# Patient Record
Sex: Female | Born: 1965 | Race: White | Hispanic: No | Marital: Married | Smoking: Never smoker
Health system: Southern US, Community
[De-identification: ages and names within clinical notes are randomized; demographics above are authoritative.]

## PROBLEM LIST (undated history)

## (undated) DIAGNOSIS — E288 Other ovarian dysfunction: Secondary | ICD-10-CM

## (undated) DIAGNOSIS — IMO0002 Reserved for concepts with insufficient information to code with codable children: Secondary | ICD-10-CM

## (undated) DIAGNOSIS — E039 Hypothyroidism, unspecified: Secondary | ICD-10-CM

## (undated) DIAGNOSIS — T8859XA Other complications of anesthesia, initial encounter: Secondary | ICD-10-CM

## (undated) DIAGNOSIS — M7710 Lateral epicondylitis, unspecified elbow: Secondary | ICD-10-CM

## (undated) DIAGNOSIS — C801 Malignant (primary) neoplasm, unspecified: Secondary | ICD-10-CM

## (undated) DIAGNOSIS — T4145XA Adverse effect of unspecified anesthetic, initial encounter: Secondary | ICD-10-CM

## (undated) DIAGNOSIS — Z800 Family history of malignant neoplasm of digestive organs: Secondary | ICD-10-CM

## (undated) DIAGNOSIS — Z801 Family history of malignant neoplasm of trachea, bronchus and lung: Secondary | ICD-10-CM

## (undated) DIAGNOSIS — R7989 Other specified abnormal findings of blood chemistry: Secondary | ICD-10-CM

## (undated) DIAGNOSIS — Z808 Family history of malignant neoplasm of other organs or systems: Secondary | ICD-10-CM

## (undated) DIAGNOSIS — K219 Gastro-esophageal reflux disease without esophagitis: Secondary | ICD-10-CM

## (undated) DIAGNOSIS — M1990 Unspecified osteoarthritis, unspecified site: Secondary | ICD-10-CM

## (undated) DIAGNOSIS — H9319 Tinnitus, unspecified ear: Secondary | ICD-10-CM

## (undated) DIAGNOSIS — D219 Benign neoplasm of connective and other soft tissue, unspecified: Secondary | ICD-10-CM

## (undated) DIAGNOSIS — Z923 Personal history of irradiation: Secondary | ICD-10-CM

## (undated) DIAGNOSIS — Z8049 Family history of malignant neoplasm of other genital organs: Secondary | ICD-10-CM

## (undated) HISTORY — PX: CATARACT EXTRACTION, BILATERAL: SHX1313

## (undated) HISTORY — DX: Family history of malignant neoplasm of trachea, bronchus and lung: Z80.1

## (undated) HISTORY — PX: ADENOIDECTOMY: SUR15

## (undated) HISTORY — DX: Family history of malignant neoplasm of digestive organs: Z80.0

## (undated) HISTORY — DX: Malignant (primary) neoplasm, unspecified: C80.1

## (undated) HISTORY — PX: TUBAL LIGATION: SHX77

## (undated) HISTORY — DX: Benign neoplasm of connective and other soft tissue, unspecified: D21.9

## (undated) HISTORY — PX: LASER ABLATION OF CONDYLOMAS: SHX1948

## (undated) HISTORY — DX: Other ovarian dysfunction: E28.8

## (undated) HISTORY — PX: WISDOM TOOTH EXTRACTION: SHX21

## (undated) HISTORY — DX: Lateral epicondylitis, unspecified elbow: M77.10

## (undated) HISTORY — DX: Family history of malignant neoplasm of other organs or systems: Z80.8

## (undated) HISTORY — DX: Family history of malignant neoplasm of other genital organs: Z80.49

## (undated) HISTORY — DX: Reserved for concepts with insufficient information to code with codable children: IMO0002

## (undated) HISTORY — PX: BREAST EXCISIONAL BIOPSY: SUR124

---
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
## 1985-07-29 HISTORY — PX: BREAST BIOPSY: SHX20

## 1989-11-26 HISTORY — PX: CRYOTHERAPY: SHX1416

## 1998-06-08 ENCOUNTER — Other Ambulatory Visit: Admission: RE | Admit: 1998-06-08 | Discharge: 1998-06-08 | Payer: Self-pay | Admitting: *Deleted

## 1999-02-09 ENCOUNTER — Other Ambulatory Visit: Admission: RE | Admit: 1999-02-09 | Discharge: 1999-02-09 | Payer: Self-pay | Admitting: Obstetrics and Gynecology

## 1999-09-14 ENCOUNTER — Inpatient Hospital Stay (HOSPITAL_COMMUNITY): Admission: AD | Admit: 1999-09-14 | Discharge: 1999-09-17 | Payer: Self-pay | Admitting: Obstetrics and Gynecology

## 1999-09-18 ENCOUNTER — Encounter: Admission: RE | Admit: 1999-09-18 | Discharge: 1999-10-15 | Payer: Self-pay | Admitting: Obstetrics and Gynecology

## 1999-10-17 ENCOUNTER — Other Ambulatory Visit: Admission: RE | Admit: 1999-10-17 | Discharge: 1999-10-17 | Payer: Self-pay | Admitting: Obstetrics and Gynecology

## 2000-11-20 ENCOUNTER — Other Ambulatory Visit: Admission: RE | Admit: 2000-11-20 | Discharge: 2000-11-20 | Payer: Self-pay | Admitting: Obstetrics and Gynecology

## 2001-11-20 ENCOUNTER — Other Ambulatory Visit: Admission: RE | Admit: 2001-11-20 | Discharge: 2001-11-20 | Payer: Self-pay | Admitting: Obstetrics and Gynecology

## 2002-04-13 ENCOUNTER — Ambulatory Visit (HOSPITAL_COMMUNITY): Admission: RE | Admit: 2002-04-13 | Discharge: 2002-04-13 | Payer: Self-pay | Admitting: Gastroenterology

## 2002-04-13 ENCOUNTER — Encounter (INDEPENDENT_AMBULATORY_CARE_PROVIDER_SITE_OTHER): Payer: Self-pay | Admitting: Specialist

## 2002-12-16 ENCOUNTER — Other Ambulatory Visit: Admission: RE | Admit: 2002-12-16 | Discharge: 2002-12-16 | Payer: Self-pay | Admitting: Obstetrics and Gynecology

## 2003-02-11 ENCOUNTER — Ambulatory Visit (HOSPITAL_COMMUNITY): Admission: RE | Admit: 2003-02-11 | Discharge: 2003-02-11 | Payer: Self-pay | Admitting: Obstetrics and Gynecology

## 2003-03-30 ENCOUNTER — Encounter: Payer: Self-pay | Admitting: Internal Medicine

## 2003-03-30 ENCOUNTER — Encounter: Admission: RE | Admit: 2003-03-30 | Discharge: 2003-03-30 | Payer: Self-pay | Admitting: Internal Medicine

## 2003-04-22 ENCOUNTER — Encounter: Admission: RE | Admit: 2003-04-22 | Discharge: 2003-04-22 | Payer: Self-pay | Admitting: Obstetrics and Gynecology

## 2003-04-22 ENCOUNTER — Encounter: Payer: Self-pay | Admitting: Obstetrics and Gynecology

## 2004-07-11 ENCOUNTER — Other Ambulatory Visit: Admission: RE | Admit: 2004-07-11 | Discharge: 2004-07-11 | Payer: Self-pay | Admitting: Obstetrics and Gynecology

## 2005-01-21 ENCOUNTER — Encounter: Admission: RE | Admit: 2005-01-21 | Discharge: 2005-01-21 | Payer: Self-pay | Admitting: Obstetrics and Gynecology

## 2005-01-30 ENCOUNTER — Encounter: Admission: RE | Admit: 2005-01-30 | Discharge: 2005-01-30 | Payer: Self-pay | Admitting: Obstetrics and Gynecology

## 2005-05-21 ENCOUNTER — Ambulatory Visit: Payer: Self-pay | Admitting: Internal Medicine

## 2005-05-22 ENCOUNTER — Ambulatory Visit: Payer: Self-pay | Admitting: Internal Medicine

## 2005-10-29 ENCOUNTER — Other Ambulatory Visit: Admission: RE | Admit: 2005-10-29 | Discharge: 2005-10-29 | Payer: Self-pay | Admitting: Obstetrics and Gynecology

## 2006-03-06 ENCOUNTER — Encounter: Admission: RE | Admit: 2006-03-06 | Discharge: 2006-03-06 | Payer: Self-pay | Admitting: Obstetrics and Gynecology

## 2006-08-25 ENCOUNTER — Encounter: Admission: RE | Admit: 2006-08-25 | Discharge: 2006-08-25 | Payer: Self-pay | Admitting: Obstetrics & Gynecology

## 2006-12-29 ENCOUNTER — Other Ambulatory Visit: Admission: RE | Admit: 2006-12-29 | Discharge: 2006-12-29 | Payer: Self-pay | Admitting: Obstetrics and Gynecology

## 2008-01-06 ENCOUNTER — Encounter: Admission: RE | Admit: 2008-01-06 | Discharge: 2008-01-06 | Payer: Self-pay | Admitting: Obstetrics and Gynecology

## 2008-04-26 ENCOUNTER — Other Ambulatory Visit: Admission: RE | Admit: 2008-04-26 | Discharge: 2008-04-26 | Payer: Self-pay | Admitting: Obstetrics and Gynecology

## 2008-12-27 DIAGNOSIS — E2839 Other primary ovarian failure: Secondary | ICD-10-CM

## 2008-12-27 HISTORY — DX: Other primary ovarian failure: E28.39

## 2009-02-06 ENCOUNTER — Encounter: Admission: RE | Admit: 2009-02-06 | Discharge: 2009-02-06 | Payer: Self-pay | Admitting: Obstetrics and Gynecology

## 2009-02-09 ENCOUNTER — Encounter: Admission: RE | Admit: 2009-02-09 | Discharge: 2009-02-09 | Payer: Self-pay | Admitting: Obstetrics and Gynecology

## 2009-08-28 ENCOUNTER — Encounter: Admission: RE | Admit: 2009-08-28 | Discharge: 2009-08-28 | Payer: Self-pay | Admitting: Obstetrics and Gynecology

## 2010-04-28 HISTORY — PX: BREAST SURGERY: SHX581

## 2010-05-14 ENCOUNTER — Encounter: Admission: RE | Admit: 2010-05-14 | Discharge: 2010-05-14 | Payer: Self-pay | Admitting: Obstetrics and Gynecology

## 2010-05-22 ENCOUNTER — Encounter: Admission: RE | Admit: 2010-05-22 | Discharge: 2010-05-22 | Payer: Self-pay | Admitting: Obstetrics and Gynecology

## 2010-08-19 ENCOUNTER — Encounter: Payer: Self-pay | Admitting: Obstetrics and Gynecology

## 2010-12-14 NOTE — Op Note (Signed)
[facility]  Patient:    Alexis Young, Alexis Young                    MRN: [id number] Proc. Date: 09/14/99 Adm. Date:  [id number] Attending:  Silverio Lay A                           Operative Report  PREOPERATIVE DIAGNOSIS:       A 40-weeks and 1-day suspected macrosomia and failure                               to descend with failed vacuum attempt.  POSTOPERATIVE DIAGNOSIS:      A 40-weeks and 1-day suspected macrosomia and failure                               to descend with failed vacuum attempt, with confirmed                               macrosomia.  OPERATION:                    Primary low transverse cesarean section.  SURGEON:                      Silverio Lay, M.D.  ASSISTANT:                    Genia Del, M.D.  ANESTHESIA:                   Epidural.  ESTIMATED BLOOD LOSS:         600 cc.  DESCRIPTION OF PROCEDURE:     After being informed of the procedure and the possible complications including bleeding, infection, and trauma to other organs, informed consent was obtained.  The patient was taken to OR #2.  Epidural anesthesia was increased.  The patient was placed in a dorsal decubitus with pelvis tilted to the left.  Prepped and draped in a sterile fashion, and a Foley catheter was already in place.  After assessing adequate level of anesthesia, the skin, subcutaneous tissue, and fat were incised in a Pfannenstiel way.  The fascia was incised in a low transverse fashion and linea alba was dissected, and peritoneum was entered in the midline fascia.  The visceral peritoneum was opened in a low  transverse fashion, and allowing [place] to bluntly dissect the bladder and retract he bladder with bladder retractor.  The uterus was then entered in a low transverse fashion, first with knife and then with scissors.  Amniotic fluid was clear. We assisted the birth of a female infant at 9:53 p.m. in right OP presentation.   Two  nuchal cord loops were reduced.  Mouth and nose were suctioned, and shoulders were delivered.  Cord was clamped with two Kelly clamps ________, and the baby was given to the pediatrician present in the room.  20 cc of blood were drawn from he umbilical vein and the placenta was continuously expelled, complete, and cord has three vessels.  Uterine revision was negative.  The uterus was then closed in two layers, first a running locked suture of 0 Vicryl, and them a Lambert suture of  0 Vicryl covering the first  one.  Hemostasis was completed at the two angles with a figure-of-eight stitch of 0 Vicryl.  Bleeding was found on the bladder flap and was controlled with two figure-of-eight stitch of 3-0 Vicryl.  Both pericolic gutters were cleansed.  Both tubes and ovaries were visualized and normal.  Hemostasis n the uterine incision was reassessed and adequate.  Hemostasis under fascia was completed with cautery, and the fascia was closed with ________ suture of 0 Vicryl meeting midline.  Subcutaneous tissues and fat were irrigated with saline. Hemostasis was obtained with cautery.  The wound was infiltrated with Marcaine .25 10 cc, and the skin was closed with staples.  Estimated blood loss is 600 cc. he baby received an Apgar of 8 at one minute and 9 at five minutes, and weighed  9 pounds and 6 ounces.  Sponge and instrument count is complete x 2, and the procedure was very well tolerated by the patient who was taken to the recovery oom in a well and stable condition. DD:  09/14/99 TD:  09/16/99 Job: [id number]

## 2010-12-14 NOTE — Op Note (Signed)
NAMEASAL, TEAS                       ACCOUNT NO.:  [id number]   MEDICAL RECORD NO.:  [id number]                   PATIENT TYPE:  AMB   LOCATION:  SDC                                  FACILITY:  [facility]   PHYSICIAN:  Dois Davenport A. Rivard, M.D.              DATE OF BIRTH:  1966-06-26   DATE OF PROCEDURE:  02/11/2003  DATE OF DISCHARGE:                                 OPERATIVE REPORT   PREOPERATIVE DIAGNOSIS:  Desire for sterilization.   POSTOPERATIVE DIAGNOSIS:  Desire for sterilization.   ANESTHESIA:  General.   PROCEDURE:  Bilateral tubal sterilization by laparoscopy.   SURGEON:  Crist Fat. Rivard, M.D.   ESTIMATED BLOOD LOSS:  Minimal.   PROCEDURE:  After being informed of the planned procedure with possible  complications including bleeding, infection, injury to bowels or bladder,  need for laparotomy, irreversibility, and failure rate of one in 500,  informed consent was obtained.  The patient is taken to OR #3, given general  anesthesia with endotracheal intubation without any complication.  She was  placed in a lithotomy position, prepped and draped in a sterile fashion.  Her bladder is emptied with an in-and-out catheter.   A speculum is inserted in the vagina.  Anterior lip of the cervix is grasped  with a tenaculum forceps, and an acorn intrauterine manipulator is placed.  We infiltrate the umbilical area with 5 mL of Marcaine 0.25% and perform a  semi-elliptical incision with knife.  Veress needle is then inserted to  create a pneumoperitoneum with CO2 at a maximum pressure of 15 mmHg.  Veress  needle is removed and a 10 mm trocar is inserted with the laparoscope on the  video.   Observation:  Anterior cul-de-sac is normal, posterior cul-de-sac is normal.  Right tube, right ovary normal.  Left tube, left ovary normal.  Uterus is  slightly bulky with no specific fibroid, most likely adenomyosis, and size  would correspond to an eight-week pregnancy.  Appendix is  normal.  Liver  edge is seen and normal.   We proceed with cauterization of both tubes in the isthmic-ampullary area on  a 1.5 cm distance, including mesosalpinx.  The procedure is well-tolerated.  Instrument and sponge counts are complete x2.  Instruments are removed.  Pneumoperitoneum is evacuated.  The fascia of the incision is closed with a  simple suture of 0 Vicryl under direct visualization.  Skin is closed with  two subcuticular suture of 4-0 Vicryl.   Estimated blood loss is minimal.  Instrument and sponge counts are complete  x2.  The procedure is well-tolerated by the patient, who is taken to the  recovery room in a well and stable condition.  Crist Fat Rivard, M.D.   SAR/MEDQ  D:  02/11/2003  T:  02/13/2003  Job:  [id number]

## 2010-12-14 NOTE — Discharge Summary (Signed)
[facility]  Patient:    Alexis Young, BRONER                    MRN: [id number] Adm. Date:  [id number] Disc. Date: [id number] Attending:  Silverio Lay A                           Discharge Summary  REASON FOR ADMISSION:         Suspicion of macrosomia in a term pregnancy with favorable cervix.  HISTORY OF PRESENT ILLNESS:   This is a 45 year old, gravida 1, para 0, abortus 0, patient with an estimated delivery date of September 13, 1999, being admitted at [redacted] weeks gestation after being evaluated in the office with an estimated fetal weight of 4344 grams for 93rd percentile.  The amniotic fluid index at that time was normal.  Cervical examination was 2 cm, 75%, vertex -1 and the patient was offered elective induction of labor for suspected macrosomia.  Labor progressed normally and the patient was complete by 6:40 p.m. that same day with the baby presenting in ROP station, +1 to +2.  After two hours of pushing and no change in station which was +2, the patient was offered a trial of vacuum extraction with knowledge that risk of shoulder dystocia remained.  That attempt was failed and decision was made to proceed with primary low transverse cesarean section for failure to descend.    HOSPITAL COURSE:              The patient underwent that surgery and delivered  female infant at 9:63 p.m. weighing 9 pounds 6 ounces with Apgars of 8 at one minute and 9 at five minutes with two loose nuchal cord loops.  There were no complications during surgery.  Postoperative course was uneventful and the patient was discharged from the hospital on September 17, 1999, in good and stable condition.  She was asked to return to the office in four to six weeks for her postoperative evaluation and she was given a Tylox prescription for pain control. She was also instructed to call if experiencing increased bleeding, increased pain, or fever. DD:  11/05/99 TD:   11/06/99 Job: [id number]

## 2010-12-14 NOTE — Op Note (Signed)
NAMESHANDEE, Alexis Young                       ACCOUNT NO.:  [id number]   MEDICAL RECORD NO.:  [id number]                   PATIENT TYPE:  AMB   LOCATION:  ENDO                                 FACILITY:  [facility]   PHYSICIAN:  Charna Elizabeth, M.D.                   DATE OF BIRTH:  08/24/1965   DATE OF PROCEDURE:  04/13/2002  DATE OF DISCHARGE:                                 OPERATIVE REPORT   PROCEDURE:  Colonoscopy with biopsies.   ENDOSCOPIST:  Charna Elizabeth, M.D.   INSTRUMENT USED:  Olympus video colonoscope (adjustable pediatric scope).   INDICATION FOR PROCEDURE:  History of change in bowel habits with worsening  diarrhea and abdominal pain, with elevated sedimentation rate in a 45-year-  old white female with a family history of colon cancer.  Rule out IBD,  colonic polyps, etc.   PREPROCEDURE PREPARATION:  Informed consent was procured from the patient.  The patient was fasted for eight hours prior to the procedure and prepped  with a bottle of magnesium citrate and a gallon of NuLytely the night prior  to the procedure.   PREPROCEDURE PHYSICAL:  VITAL SIGNS:  The patient had stable vital signs.  NECK:  Supple.  CHEST:  Clear to auscultation.  S1, S2 regular.  ABDOMEN:  Soft with normal bowel sounds.   DESCRIPTION OF PROCEDURE:  The patient was placed in the left lateral  decubitus position and sedated with 60 mg of Demerol and 6 mg of Versed  intravenously.  Once the patient was adequately sedate and maintained on low-  flow oxygen and continuous cardiac monitoring, the Olympus video colonoscope  was advanced from the rectum to the cecum and terminal ileum without  difficulty.  The entire colonic mucosa appeared healthy with no evidence of  colitis, masses, polyps, erosions, ulcerations, or diverticulosis.  The  scope was advanced into the terminal ileum.  There was evidence of  inflammatory change in the terminal ileum, which was biopsied for pathology.  No frank erosions  or masses were seen.  The patient tolerated the procedure  well without complications.   IMPRESSION:  1. Normal-appearing colonic mucosa.  2. Inflammatory change seen in the terminal ileum, biopsied to rule out     inflammatory bowel disease.    RECOMMENDATIONS:  1. Await pathology results.  2. Avoid all nonsteroidals, including aspirin.  3. Outpatient follow-up in the next two weeks for further recommendations.                                               Charna Elizabeth, M.D.    JM/MEDQ  D:  04/13/2002  T:  04/14/2002  Job:  [id number]   cc:   Dois Davenport A. Rivard, M.D.  [address]  [place]  [place]  [address]  Fax: [phone]

## 2011-04-23 ENCOUNTER — Other Ambulatory Visit: Payer: Self-pay | Admitting: Obstetrics and Gynecology

## 2011-04-23 DIAGNOSIS — Z1231 Encounter for screening mammogram for malignant neoplasm of breast: Secondary | ICD-10-CM

## 2011-06-03 ENCOUNTER — Ambulatory Visit
Admission: RE | Admit: 2011-06-03 | Discharge: 2011-06-03 | Disposition: A | Discharge status: Home / Self Care | Payer: BC Managed Care – PPO | Source: Ambulatory Visit | Attending: Obstetrics and Gynecology | Admitting: Obstetrics and Gynecology

## 2011-06-03 DIAGNOSIS — Z1231 Encounter for screening mammogram for malignant neoplasm of breast: Secondary | ICD-10-CM

## 2011-08-29 LAB — HM PAP SMEAR: HM Pap smear: NEGATIVE

## 2012-05-06 ENCOUNTER — Other Ambulatory Visit: Payer: Self-pay | Admitting: Obstetrics and Gynecology

## 2012-05-06 DIAGNOSIS — Z1231 Encounter for screening mammogram for malignant neoplasm of breast: Secondary | ICD-10-CM

## 2012-06-05 ENCOUNTER — Ambulatory Visit
Admission: RE | Admit: 2012-06-05 | Discharge: 2012-06-05 | Disposition: A | Discharge status: Home / Self Care | Payer: BC Managed Care – PPO | Source: Ambulatory Visit | Attending: Obstetrics and Gynecology | Admitting: Obstetrics and Gynecology

## 2012-06-05 DIAGNOSIS — Z1231 Encounter for screening mammogram for malignant neoplasm of breast: Secondary | ICD-10-CM

## 2012-06-10 ENCOUNTER — Other Ambulatory Visit: Payer: Self-pay | Admitting: Obstetrics and Gynecology

## 2012-06-10 DIAGNOSIS — R928 Other abnormal and inconclusive findings on diagnostic imaging of breast: Secondary | ICD-10-CM

## 2012-06-12 ENCOUNTER — Other Ambulatory Visit: Payer: Self-pay | Admitting: Obstetrics and Gynecology

## 2012-06-12 ENCOUNTER — Ambulatory Visit
Admission: RE | Admit: 2012-06-12 | Discharge: 2012-06-12 | Disposition: A | Discharge status: Home / Self Care | Payer: BC Managed Care – PPO | Source: Ambulatory Visit | Attending: Obstetrics and Gynecology | Admitting: Obstetrics and Gynecology

## 2012-06-12 DIAGNOSIS — R928 Other abnormal and inconclusive findings on diagnostic imaging of breast: Secondary | ICD-10-CM

## 2012-06-23 ENCOUNTER — Ambulatory Visit
Admission: RE | Admit: 2012-06-23 | Discharge: 2012-06-23 | Disposition: A | Discharge status: Home / Self Care | Payer: BC Managed Care – PPO | Source: Ambulatory Visit | Attending: Obstetrics and Gynecology | Admitting: Obstetrics and Gynecology

## 2012-06-23 ENCOUNTER — Other Ambulatory Visit: Payer: Self-pay | Admitting: Obstetrics and Gynecology

## 2012-06-23 DIAGNOSIS — R928 Other abnormal and inconclusive findings on diagnostic imaging of breast: Secondary | ICD-10-CM

## 2012-06-26 ENCOUNTER — Other Ambulatory Visit: Payer: BC Managed Care – PPO

## 2012-06-29 ENCOUNTER — Other Ambulatory Visit: Payer: BC Managed Care – PPO

## 2013-01-22 ENCOUNTER — Ambulatory Visit (INDEPENDENT_AMBULATORY_CARE_PROVIDER_SITE_OTHER): Payer: BC Managed Care – PPO | Admitting: Obstetrics & Gynecology

## 2013-01-22 ENCOUNTER — Encounter: Payer: Self-pay | Admitting: Obstetrics & Gynecology

## 2013-01-22 ENCOUNTER — Encounter: Payer: Self-pay | Admitting: *Deleted

## 2013-01-22 ENCOUNTER — Telehealth: Payer: Self-pay | Admitting: Nurse Practitioner

## 2013-01-22 ENCOUNTER — Other Ambulatory Visit: Payer: Self-pay | Admitting: Obstetrics & Gynecology

## 2013-01-22 VITALS — BP 116/70 | HR 64 | Resp 12 | Ht 65.0 in | Wt 193.4 lb

## 2013-01-22 DIAGNOSIS — J069 Acute upper respiratory infection, unspecified: Secondary | ICD-10-CM

## 2013-01-22 DIAGNOSIS — N6019 Diffuse cystic mastopathy of unspecified breast: Secondary | ICD-10-CM

## 2013-01-22 DIAGNOSIS — N63 Unspecified lump in unspecified breast: Secondary | ICD-10-CM

## 2013-01-22 MED ORDER — AZITHROMYCIN 250 MG PO TABS: 250.0000 mg | ORAL_TABLET | Freq: Every day | ORAL | Status: DC

## 2013-01-22 NOTE — Telephone Encounter (Signed)
Patietn found breast lump last night. OV today at 145.

## 2013-01-22 NOTE — Telephone Encounter (Signed)
Left breast lump found last night. [facility] asked her to call us.

## 2013-01-22 NOTE — Progress Notes (Signed)
Subjective:     Patient ID: Alexis Young, female   DOB: 1966/06/15, 47 y.o.   MRN: [id number]  HPI 47 year old G1P1 MWF here for left breast lump she noted last night and the shower.  She has a history of fibrocystic breast changes.  10/11--right bx showign fibroadenoma.  11/13--fibrocystic changes.  All done after having 3D MMG for screening.  No trauma.  No nipple discharge.  No family hx of breast cancer.    Back from [place].  Has had cough and sneezing for last two weeks.  Been on mucinex and robitussin and tylenol cold/sinus.  Taking Nyquil at night.      Review of Systems  All other systems reviewed and are negative.      Objective:   Physical Exam  Constitutional: She appears well-developed and well-nourished.  Neck: Normal range of motion. Neck supple. No thyromegaly present.  Cardiovascular: Normal rate and regular rhythm.   Pulmonary/Chest: Effort normal and breath sounds normal. Right breast exhibits mass. Right breast exhibits no inverted nipple, no nipple discharge, no skin change and no tenderness. Left breast exhibits no inverted nipple, no mass, no nipple discharge, no skin change and no tenderness.    Lymphadenopathy:    She has no cervical adenopathy.       Assessment:     Left breast mass/cyst Fibrocystic breast changes URI     Plan:     Diagnostic left MMG/Ultrasound Pt will use Zyrtec or Claritin and then Zpak if no improvement

## 2013-01-22 NOTE — Patient Instructions (Signed)
We will call you after the results are back to discuss if there is anything else I want done.

## 2013-01-25 ENCOUNTER — Ambulatory Visit: Payer: Self-pay | Admitting: Obstetrics & Gynecology

## 2013-02-04 ENCOUNTER — Ambulatory Visit
Admission: RE | Admit: 2013-02-04 | Discharge: 2013-02-04 | Disposition: A | Discharge status: Home / Self Care | Payer: BC Managed Care – PPO | Source: Ambulatory Visit | Attending: Obstetrics & Gynecology | Admitting: Obstetrics & Gynecology

## 2013-02-04 DIAGNOSIS — N6019 Diffuse cystic mastopathy of unspecified breast: Secondary | ICD-10-CM

## 2013-02-04 DIAGNOSIS — N63 Unspecified lump in unspecified breast: Secondary | ICD-10-CM

## 2013-02-11 ENCOUNTER — Telehealth: Payer: Self-pay | Admitting: Orthopedic Surgery

## 2013-02-11 NOTE — Telephone Encounter (Signed)
Message copied by Lorenz Coaster P on Thu Feb 11, 2013  1:30 PM ------      Message from: Jerene Bears      Created: Thu Feb 04, 2013  1:11 PM       Please call pt.  MMG and ultrasound showed breast cyst.  She should have a routine MMG in December.  Can come out of current hold.  No recall needed.  Please have her call or return if she has any worsening symptoms. ------

## 2013-02-11 NOTE — Telephone Encounter (Signed)
Message copied by Alfredo Batty on Thu Feb 11, 2013  3:30 PM ------      Message from: Jerene Bears      Created: Thu Feb 04, 2013  1:11 PM       Please call pt.  MMG and ultrasound showed breast cyst.  She should have a routine MMG in December.  Can come out of current hold.  No recall needed.  Please have her call or return if she has any worsening symptoms. ------

## 2013-02-11 NOTE — Telephone Encounter (Signed)
LMTCB re: breast US results.  aa

## 2013-02-16 ENCOUNTER — Telehealth: Payer: Self-pay | Admitting: Orthopedic Surgery

## 2013-02-16 NOTE — Telephone Encounter (Signed)
Message copied by Alfredo Batty on Tue Feb 16, 2013  9:46 AM ------      Message from: Jerene Bears      Created: Thu Feb 04, 2013  1:11 PM       Please call pt.  MMG and ultrasound showed breast cyst.  She should have a routine MMG in December.  Can come out of current hold.  No recall needed.  Please have her call or return if she has any worsening symptoms. ------

## 2013-02-16 NOTE — Telephone Encounter (Signed)
Spoke with pt about results of breast US. Pt instructed to have routine MMG in December. Pt agreeable.

## 2013-03-29 HISTORY — PX: MELANOMA EXCISION: SHX5266

## 2013-03-31 ENCOUNTER — Other Ambulatory Visit: Payer: Self-pay | Admitting: Dermatology

## 2013-05-04 ENCOUNTER — Other Ambulatory Visit: Payer: Self-pay

## 2013-05-04 DIAGNOSIS — Z1231 Encounter for screening mammogram for malignant neoplasm of breast: Secondary | ICD-10-CM

## 2013-06-28 ENCOUNTER — Ambulatory Visit
Admission: RE | Admit: 2013-06-28 | Discharge: 2013-06-28 | Disposition: A | Discharge status: Home / Self Care | Payer: BC Managed Care – PPO | Source: Ambulatory Visit

## 2013-06-28 DIAGNOSIS — Z1231 Encounter for screening mammogram for malignant neoplasm of breast: Secondary | ICD-10-CM

## 2013-06-28 HISTORY — PX: SKIN CANCER EXCISION: SHX779

## 2013-07-26 ENCOUNTER — Other Ambulatory Visit: Payer: Self-pay | Admitting: Dermatology

## 2013-09-17 ENCOUNTER — Ambulatory Visit: Payer: Self-pay | Admitting: Nurse Practitioner

## 2013-09-21 ENCOUNTER — Encounter: Payer: Self-pay | Admitting: Nurse Practitioner

## 2013-09-21 ENCOUNTER — Ambulatory Visit (INDEPENDENT_AMBULATORY_CARE_PROVIDER_SITE_OTHER): Payer: BC Managed Care – PPO | Admitting: Nurse Practitioner

## 2013-09-21 VITALS — BP 122/80 | HR 68 | Ht 65.0 in | Wt 194.0 lb

## 2013-09-21 DIAGNOSIS — E288 Other ovarian dysfunction: Secondary | ICD-10-CM

## 2013-09-21 DIAGNOSIS — Z01419 Encounter for gynecological examination (general) (routine) without abnormal findings: Secondary | ICD-10-CM

## 2013-09-21 DIAGNOSIS — Z0000 Encounter for general adult medical examination without abnormal findings: Secondary | ICD-10-CM

## 2013-09-21 DIAGNOSIS — L659 Nonscarring hair loss, unspecified: Secondary | ICD-10-CM | POA: Insufficient documentation

## 2013-09-21 DIAGNOSIS — N926 Irregular menstruation, unspecified: Secondary | ICD-10-CM

## 2013-09-21 DIAGNOSIS — N939 Abnormal uterine and vaginal bleeding, unspecified: Secondary | ICD-10-CM

## 2013-09-21 DIAGNOSIS — E2839 Other primary ovarian failure: Secondary | ICD-10-CM | POA: Insufficient documentation

## 2013-09-21 DIAGNOSIS — R232 Flushing: Secondary | ICD-10-CM | POA: Insufficient documentation

## 2013-09-21 LAB — POCT URINALYSIS DIPSTICK
Bilirubin, UA: NEGATIVE
Glucose, UA: NEGATIVE
Ketones, UA: NEGATIVE
Nitrite, UA: NEGATIVE
Urobilinogen, UA: NEGATIVE
pH, UA: 6

## 2013-09-21 LAB — HEMOGLOBIN, FINGERSTICK: Hemoglobin, fingerstick: 13 g/dL (ref 12.0–16.0)

## 2013-09-21 MED ORDER — MEDROXYPROGESTERONE ACETATE 10 MG PO TABS: 10.0000 mg | ORAL_TABLET | ORAL | Status: DC

## 2013-09-21 MED ORDER — VENLAFAXINE HCL ER 75 MG PO CP24: 75.0000 mg | ORAL_CAPSULE | Freq: Every day | ORAL | Status: DC

## 2013-09-21 MED ORDER — ESTRADIOL 1 MG PO TABS: 1.0000 mg | ORAL_TABLET | Freq: Every day | ORAL | Status: DC

## 2013-09-21 NOTE — Patient Instructions (Signed)

## 2013-09-21 NOTE — Progress Notes (Signed)
Patient ID: Alexis Young, female   DOB: Oct 08, 1965, 48 y.o.   MRN: [id number] 48 y.o. G1P1001. Married Caucasian Fe here for annual exam.  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  Past 2 cycles menses is "old brown looking" X 5 days.  Then past 2 days again brown vaginal spotting. No cramps or other PMS symptoms.  No urinary symptoms. She has been sick with bronchitis and took courses of antibiotics and steroids X 3 since end of October.  In fact she is still having tinnitus and is to see ENT tomorrow. Now diagnosed with pleurisy and now on Naprosyn.  She wants to restart Effexor for the vaso symptoms.  They are pretty regular and sometimes worse at night.  She took Effexor XR after the birth of her last child and did well.  Also having increase in hair loss.  Wants to check some labs.  Patient's last menstrual period was 09/07/2013.          Sexually active: yes  The current method of family planning is none.    Exercising: yes  walking on treadmill Smoker:  no  Health Maintenance: Pap:  08/29/11, WNL, neg HR HPV MMG:  06/28/13, Bi-Rads 1: negative Colonoscopy: 03/2002 normal TDaP:  6/08 Labs:  HB:  13.0 Urine:  Large RBC, 1+ leuk's, trace protein,    reports that she has never smoked. She has never used smokeless tobacco. She reports that she does not drink alcohol or use illicit drugs.  Past Medical History  Diagnosis Date  . Fibroid   . Dyspareunia   . Cancer 03/2013, 06/2013    malignant melanoma on right arm and back  . 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 Tubal ligation    . Melanoma excision Right 03/2013    melanoma right arm  . Skin cancer excision  06/2013    midddle of back with path report of atypia  . Cryotherapy  5/91    for persistent condyloma atypia  . Laser ablation of condylomas  03/05/90 Dr. Warnell Forester    perineal area- also had Bowens disease  . Breast surgery Right 04/2010   breast biopsy for multiple fibroadenoma - no atypia  . Breast biopsy  1987    Dr. Rosana Hoes    Current Outpatient Prescriptions  Medication Sig Dispense Refill  . estradiol (ESTRACE) 1 MG tablet Take 1 tablet (1 mg total) by mouth daily.  90 tablet  3  . medroxyPROGESTERone (PROVERA) 10 MG tablet Take 1 tablet (10 mg total) by mouth as directed. Take day 1-7  30 tablet  3  . venlafaxine XR (EFFEXOR XR) 75 MG 24 hr capsule Take 1 capsule (75 mg total) by mouth daily with breakfast.  90 capsule  0   No current facility-administered medications for this visit.    Family History  Problem Relation Age of Onset  . Cancer Mother     lung  . Alcohol abuse Father     Cirrhosis of liver  . Cancer Maternal Aunt     stomach cancer  . Cancer Maternal Grandmother     uterine cancer    ROS:  Pertinent items are noted in HPI.  Otherwise, a comprehensive ROS was negative.  Exam:   BP 122/80  Pulse 68  Ht 5\' 5"  (1.651 m)  Wt 194 lb (87.998 kg)  BMI 32.28 kg/m2  LMP 09/07/2013  Height: 5\' 5"  (165.1 cm)  Ht Readings from Last 3 Encounters:  09/21/13 5\' 5"  (1.651 m)  01/22/13 5\' 5"  (1.651 m)    General appearance: alert, cooperative and appears stated age Head: Normocephalic, without obvious abnormality, atraumatic Neck: no adenopathy, supple, symmetrical, trachea midline and thyroid normal to inspection and palpation Lungs: clear to auscultation bilaterally Breasts: normal appearance, no masses or tenderness Heart: regular rate and rhythm Abdomen: soft, non-tender; no masses,  no organomegaly Extremities: extremities normal, atraumatic, no cyanosis or edema Skin: Skin color, texture, turgor normal. No rashes or lesions Lymph nodes: Cervical, supraclavicular, and axillary nodes normal. No abnormal inguinal nodes palpated Neurologic: Grossly normal   Pelvic: External genitalia:  no lesions              Urethra:  normal appearing urethra with no masses, tenderness or lesions               Bartholin's and Skene's: normal                 Vagina: normal appearing vagina with normal color and discharge, no lesions              Cervix: anteverted              Pap taken: no Bimanual Exam:  Uterus:  normal size, contour, position, consistency, mobility, non-tender              Adnexa: no mass, fullness, tenderness               Rectovaginal: Confirms               Anus:  normal sphincter tone, no lesions  A:  Well Woman with normal exam  History of POF and on HRT since 2010  AUB this month - maybe secondary to antibiotics and steroids  Increase in Vaso symptoms  Hair loss  P:   Pap smear as per guidelines   Mammogram due 12/15  Refill on Estrace 1 mg daily for a year and Provera 10 day 1-7 monthly for a year  Counseling about HRT with potential SE, risk of DVT, CVA, cancer, etc  She will call back if AUB for next month and will most likely do PUS/ SHGM  Will plan to recheck her in 3 months with progress on Effexor XR, will call earlier if any symptoms worsens  Counseled on breast self exam, mammography screening, use and side effects of HRT, adequate intake of calcium and vitamin D, diet and exercise return annually or prn  An After Visit Summary was printed and given to the patient.

## 2013-09-22 LAB — CBC WITH DIFFERENTIAL/PLATELET
Basophils Absolute: 0.1 10*3/uL (ref 0.0–0.1)
Basophils Relative: 1 % (ref 0–1)
Eosinophils Absolute: 0.1 10*3/uL (ref 0.0–0.7)
Eosinophils Relative: 2 % (ref 0–5)
HCT: 39 % (ref 36.0–46.0)
Hemoglobin: 13.3 g/dL (ref 12.0–15.0)
Lymphocytes Relative: 34 % (ref 12–46)
Lymphs Abs: 1.8 10*3/uL (ref 0.7–4.0)
MCH: 28.9 pg (ref 26.0–34.0)
MCHC: 34.1 g/dL (ref 30.0–36.0)
MCV: 84.6 fL (ref 78.0–100.0)
Monocytes Absolute: 0.4 10*3/uL (ref 0.1–1.0)
Monocytes Relative: 8 % (ref 3–12)
Neutro Abs: 2.9 10*3/uL (ref 1.7–7.7)
Neutrophils Relative %: 55 % (ref 43–77)
Platelets: 283 10*3/uL (ref 150–400)
RBC: 4.61 MIL/uL (ref 3.87–5.11)
RDW: 13.8 % (ref 11.5–15.5)
WBC: 5.3 10*3/uL (ref 4.0–10.5)

## 2013-09-22 LAB — HEMOGLOBIN A1C
Hgb A1c MFr Bld: 5.6 % (ref <5.7)
Mean Plasma Glucose: 114 mg/dL (ref <117)

## 2013-09-22 LAB — TSH: TSH: 3.857 u[IU]/mL (ref 0.350–4.500)

## 2013-09-23 NOTE — Progress Notes (Signed)
Encounter reviewed by Dr. Josefa Half. I recommend proceeding with a pelvic ultrasound, sonohysterogram, and endometrial biopsy.  May need to change progesterone regimen of HRT to 14 days per month or daily, depending on the dosage.

## 2013-09-29 ENCOUNTER — Telehealth: Payer: Self-pay | Admitting: *Deleted

## 2013-09-29 NOTE — Telephone Encounter (Signed)
I have attempted to contact this patient by phone with the following results: left message to return my call on answering machine (mobile).  Pt has lab result note and also needs to proceed with pelvic ultrasound and SHGM per PGrubb.

## 2013-09-29 NOTE — Telephone Encounter (Signed)
Message copied by Graylon Good on Wed Sep 29, 2013 10:22 AM ------      Message from: GRUBB, PATRICIA R      Created: Thu Sep 23, 2013  9:38 AM       Let patient know that labs are normal ------

## 2013-09-30 ENCOUNTER — Telehealth: Payer: Self-pay | Admitting: Emergency Medicine

## 2013-09-30 DIAGNOSIS — N939 Abnormal uterine and vaginal bleeding, unspecified: Secondary | ICD-10-CM

## 2013-09-30 NOTE — Telephone Encounter (Signed)
Patient is calling Stephanie back

## 2013-09-30 NOTE — Telephone Encounter (Signed)
I have attempted to contact this patient by phone with the following results: left message to return my call on answering machine (mobile).  Advised I will be in the office all day today and if I am unavailable to ask for [facility].

## 2013-09-30 NOTE — Telephone Encounter (Signed)
Pt notified in result note.  Pt notified of need for ultrasound and SHGM and is agreeable.

## 2013-09-30 NOTE — Telephone Encounter (Signed)
Message left to return call to [place] at [phone].   To schedule SHGM and endo biopsy.

## 2013-10-01 NOTE — Telephone Encounter (Signed)
SHGM/Endo bx  scheduled and patient aware/agreeable to time. Advised appointment will be with Dr. Quincy Simmonds.  Pre-procedure instructions given. Motrin instructions given. Motrin=Advil=Ibuprofen Can take 800 mg (Can purchase over the counter, you will need four 200 mg pills). Take with food. Make sure to eat a meal and drink fluids prior to appointment.   Patient verbalized understanding of the [facility] appointment cancellation policy. Advised will need to cancel within 72 business hours (3 business days) or will have no show fee placed to account. $150.00 for [facility].   Patient verbalized understanding of all instructions and plans.  Routing to provider for final review. Patient agreeable to disposition. Will close encounter

## 2013-10-07 ENCOUNTER — Other Ambulatory Visit: Payer: Self-pay | Admitting: Obstetrics and Gynecology

## 2013-10-07 ENCOUNTER — Ambulatory Visit (INDEPENDENT_AMBULATORY_CARE_PROVIDER_SITE_OTHER): Payer: BC Managed Care – PPO | Admitting: Obstetrics and Gynecology

## 2013-10-07 ENCOUNTER — Encounter: Payer: Self-pay | Admitting: Obstetrics and Gynecology

## 2013-10-07 ENCOUNTER — Ambulatory Visit (INDEPENDENT_AMBULATORY_CARE_PROVIDER_SITE_OTHER): Payer: BC Managed Care – PPO

## 2013-10-07 VITALS — BP 118/76 | HR 64 | Ht 65.0 in | Wt 196.0 lb

## 2013-10-07 DIAGNOSIS — N939 Abnormal uterine and vaginal bleeding, unspecified: Secondary | ICD-10-CM

## 2013-10-07 DIAGNOSIS — N926 Irregular menstruation, unspecified: Secondary | ICD-10-CM

## 2013-10-07 DIAGNOSIS — D259 Leiomyoma of uterus, unspecified: Secondary | ICD-10-CM

## 2013-10-07 NOTE — Progress Notes (Signed)
Patient ID: Alexis Young, female   DOB: [birth year], 48 y.o.   MRN: [id number]   See separate note above for ultrasound/sonohysterogram/endometrial biopsy.

## 2013-10-07 NOTE — Progress Notes (Signed)
Subjective   48 y.o.   Married  Caucasian  female   G1P1001 with Patient's last menstrual period was 10/04/2013.   here for  Pelvic ultrasound, sonohysterogram and endometrial biopsy. Patient has had premature menopause and has been on HRT Estrace 1 mg daily and Provera 10 mg daily for 7 days per month. Usually has a withdrawal bleed but for the last couple of months has recurrent bleeding for several days after she has already stopped her withdrawal bleed.         OB History   Grav Para Term Preterm Abortions TAB SAB Ect Mult Living   1 1 1       1          Patient Active Problem List   Diagnosis Date Noted  . Vasomotor flushing 09/21/2013  . Hair loss 09/21/2013  . 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 Abnormal uterine bleeding (AUB) 09/21/2013    Past Medical History  Diagnosis Date  . Fibroid   . Dyspareunia   . Cancer 03/2013, 06/2013    malignant melanoma on right arm and back  . 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 Tubal ligation    . Melanoma excision Right 03/2013    melanoma right arm  . Skin cancer excision  06/2013    midddle of back with path report of atypia  . Cryotherapy  5/91    for persistent condyloma atypia  . Laser ablation of condylomas  03/05/90 Dr. Warnell Forester    perineal area- also had Bowens disease  . Breast surgery Right 04/2010    breast biopsy for multiple fibroadenoma - no atypia  . Breast biopsy  1987    Dr. Rosana Hoes    Current Outpatient Prescriptions  Medication Sig Dispense Refill  . estradiol (ESTRACE) 1 MG tablet Take 1 tablet (1 mg total) by mouth daily.  90 tablet  3  . medroxyPROGESTERone (PROVERA) 10 MG tablet Take 1 tablet (10 mg total) by mouth as directed. Take day 1-7  30 tablet  3  . venlafaxine XR (EFFEXOR XR) 75 MG 24 hr capsule Take 1 capsule (75 mg total) by mouth daily with breakfast.  90 capsule  0   No current facility-administered  medications for this visit.     ALLERGIES: Asa; Tetracyclines & related; and Sulfa antibiotics  Family History  Problem Relation Age of Onset  . Cancer Mother     lung  . Alcohol abuse Father     Cirrhosis of liver  . Cancer Maternal Aunt     stomach cancer  . Cancer Maternal Grandmother     uterine cancer    History   Social History  . Marital Status: Married    Spouse Name: N/A    Number of Children: 1  . Years of Education: N/A   Occupational History  .     Social History Main Topics  . Smoking status: Never Smoker   . Smokeless tobacco: Never Used  . Alcohol Use: No  . Drug Use: No  . Sexual Activity: Yes    Partners: Male    Birth Control/ Protection: None   Other Topics Concern  . Not on file   Social History Narrative  . No narrative on file    ROS:  Pertinent items are noted in HPI.  PHYSICAL EXAMINATION:    BP 118/76  Pulse 64  Ht 5\' 5"  (1.651  m)  Wt 196 lb (88.905 kg)  BMI 32.62 kg/m2  LMP 10/04/2013     Ultrasound/sonohyterogram/EMB:     Ultrasound showing intramural fibroid 33 mm located in posterior uterus midline. EMS 4.26.  Ovaries WNL.  No free fluid.  Procedure - sonohysterogram Consent performed. Speculum placed in vagina. Sterile prep of cervix with betadine. Cannula placed inside endometrial cavity without difficult. Speculum removed. Sterile saline injected. No  filling defect noted.  Some difficulty with distention but able to evaluate cavity.  Cannula removed. No complication.   Procedure - endometrial biopsy Consent performed. Speculum place in vagina.  Sterile prep of cervix with betadine.  Tenaculum to anterior cervical lip.  Pipelle placed to 7 cm without difficulty twice. Tissue obtained and sent to pathology. Speculum removed.  No complications.   Assessment  Abnormal bleeding on HRT. On cyclic HRT regimen Intramural fibroid.  Plan  Follow up EMB. Return for discussion of results and  plan.   After visit summary to patient.

## 2013-10-12 LAB — IPS CERVICAL/ECC/EMB/VULVAR/VAGINAL BIOPSY

## 2013-10-15 ENCOUNTER — Encounter: Payer: Self-pay | Admitting: Obstetrics and Gynecology

## 2013-10-15 ENCOUNTER — Ambulatory Visit (INDEPENDENT_AMBULATORY_CARE_PROVIDER_SITE_OTHER): Payer: BC Managed Care – PPO | Admitting: Obstetrics and Gynecology

## 2013-10-15 VITALS — BP 120/76 | HR 84 | Ht 65.0 in | Wt 199.5 lb

## 2013-10-15 DIAGNOSIS — N950 Postmenopausal bleeding: Secondary | ICD-10-CM

## 2013-10-15 DIAGNOSIS — D259 Leiomyoma of uterus, unspecified: Secondary | ICD-10-CM

## 2013-10-15 DIAGNOSIS — D219 Benign neoplasm of connective and other soft tissue, unspecified: Secondary | ICD-10-CM

## 2013-10-15 MED ORDER — ESTRADIOL 1 MG PO TABS: 1.0000 mg | ORAL_TABLET | Freq: Every day | ORAL | Status: DC

## 2013-10-15 MED ORDER — MEDROXYPROGESTERONE ACETATE 5 MG PO TABS: 5.0000 mg | ORAL_TABLET | ORAL | Status: DC

## 2013-10-15 NOTE — Patient Instructions (Signed)
Please keep a bleeding calendar.  You will now be taking your new dosage of Provera, 5 gm, every day when you take your daily estrogen pill, Estrace 1 mg.

## 2013-10-15 NOTE — Progress Notes (Signed)
Patient ID: Alexis Young, female   DOB: 06-24-1966, 48 y.o.   MRN: [id number]  GYNECOLOGY  VISIT   HPI: 48 y.o.   Married  Caucasian  female   G1P1001 with Patient's last menstrual period was 10/04/2013.   here for further discussion of Pelvic ultrasound, sonohysterogram and endometrial biopsy.  Patient has had premature menopause and has been on HRT Estrace 1 mg daily and Provera 10 mg daily for 7 days per month.  Usually has a withdrawal bleed but for the last couple of months has recurrent bleeding for several days after she has already stopped her withdrawal bleed.  This is very inconvenient.   Patient has been on this regimen since age 45 due to premature menopause.   Ultrasound showing intramural fibroid 33 mm located in posterior uterus midline.  EMS 4.26. Ovaries WNL. No free fluid.  EMB showed endometrium that was benign and with features of breakdown.    GYNECOLOGIC HISTORY: Patient's last menstrual period was 10/04/2013.    OB History   Grav Para Term Preterm Abortions TAB SAB Ect Mult Living   1 1 1       1          Patient Active Problem List   Diagnosis Date Noted  . Uterine fibroid 10/07/2013  . Vasomotor flushing 09/21/2013  . Hair loss 09/21/2013  . 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 Abnormal uterine bleeding (AUB) 09/21/2013    Past Medical History  Diagnosis Date  . Fibroid   . Dyspareunia   . Cancer 03/2013, 06/2013    malignant melanoma on right arm and back  . 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 Tubal ligation    . Melanoma excision Right 03/2013    melanoma right arm  . Skin cancer excision  06/2013    midddle of back with path report of atypia  . Cryotherapy  5/91    for persistent condyloma atypia  . Laser ablation of condylomas  03/05/90 Dr. Warnell Forester    perineal area- also had Bowens disease  . Breast surgery Right 04/2010    breast biopsy for multiple  fibroadenoma - no atypia  . Breast biopsy  1987    Dr. Rosana Hoes    Current Outpatient Prescriptions  Medication Sig Dispense Refill  . estradiol (ESTRACE) 1 MG tablet Take 1 tablet (1 mg total) by mouth daily.  90 tablet  3  . medroxyPROGESTERone (PROVERA) 10 MG tablet Take 1 tablet (10 mg total) by mouth as directed. Take day 1-7  30 tablet  3  . venlafaxine XR (EFFEXOR XR) 75 MG 24 hr capsule Take 1 capsule (75 mg total) by mouth daily with breakfast.  90 capsule  0   No current facility-administered medications for this visit.     ALLERGIES: Asa; Tetracyclines & related; and Sulfa antibiotics  Family History  Problem Relation Age of Onset  . Cancer Mother     lung  . Alcohol abuse Father     Cirrhosis of liver  . Cancer Maternal Aunt     stomach cancer  . Cancer Maternal Grandmother     uterine cancer    History   Social History  . Marital Status: Married    Spouse Name: N/A    Number of Children: 1  . Years of Education: N/A   Occupational History  .     Social History Main Topics  .  Smoking status: Never Smoker   . Smokeless tobacco: Never Used  . Alcohol Use: No  . Drug Use: No  . Sexual Activity: Yes    Partners: Male    Birth Control/ Protection: Surgical     Comment: Tubal   Other Topics Concern  . Not on file   Social History Narrative  . No narrative on file    ROS:  Pertinent items are noted in HPI.  PHYSICAL EXAMINATION:    BP 120/76  Pulse 84  Ht 5\' 5"  (1.651 m)  Wt 199 lb 8 oz (90.493 kg)  BMI 33.20 kg/m2  LMP 10/04/2013     General appearance: alert, cooperative and appears stated age    ASSESSMENT  Postmenopausal bleeding on cyclic HRT, prolonged bleeding. Uterine fibroid. Negative EMB.  PLAN  Patient will switch to a continuous HRT regimen - Estrace 1 mg daily and Provera 5 mg daily.  We discussed uterine fibroids, etiologies, treatments, and life history. We discussed other treatment choices including discontinuation of  HRT,  Dilation and curettage, myomectomy for women who have not completed childbearing, and hysterectomy for those who have completed childbearing. Recheck in 3 months.     An After Visit Summary was printed and given to the patient.  _25_____ minutes face to face time of which over 50% was spent in counseling.

## 2013-10-21 ENCOUNTER — Other Ambulatory Visit: Payer: Self-pay | Admitting: Dermatology

## 2013-12-13 ENCOUNTER — Ambulatory Visit (INDEPENDENT_AMBULATORY_CARE_PROVIDER_SITE_OTHER): Payer: BC Managed Care – PPO | Admitting: Nurse Practitioner

## 2013-12-13 ENCOUNTER — Encounter: Payer: Self-pay | Admitting: Nurse Practitioner

## 2013-12-13 VITALS — BP 118/72 | HR 100 | Ht 65.0 in | Wt 202.0 lb

## 2013-12-13 DIAGNOSIS — N951 Menopausal and female climacteric states: Secondary | ICD-10-CM

## 2013-12-13 MED ORDER — VENLAFAXINE HCL ER 150 MG PO CP24: 150.0000 mg | ORAL_CAPSULE | Freq: Every day | ORAL | Status: DC

## 2013-12-13 NOTE — Patient Instructions (Signed)
Will increase Effexor XR to 150 mg daily.  Recheck in June and continue menstrual record.

## 2013-12-13 NOTE — Progress Notes (Signed)
Patient ID: Alexis Young, female   DOB: 06-20-1966, 48 y.o.   MRN: [id number] HPI: 48 y.o.  G8P1001  Married  Caucasian  female returns for a consult visit.  She has a  history of POF at age 54. She is now of HRT with  Estrace 1 mg and Provera 5 mg daily.  She did have bleeding that started on April 12 - May 3 (22 days) use of pad daily but very light.  No vaginal bleeding since.  She is also here to discuss Effexor XL 75 mg.  She feels better on med's and not as tense.  She does not feel this is enough med's though to help her with vaso symptoms and wants to try increase in dose. Sleep and mood are better.   Diagnosis    .  Fibroid     .  Dyspareunia     .  Cancer  03/2013, 06/2013       malignant melanoma on right arm and back   .  Premature ovarian failure  12/2008       FSH 137.1, started on HRT      Past Surgical History    .  Tubal ligation       .  Melanoma excision  Right  03/2013       melanoma right arm   .  Skin cancer excision    06/2013       middle of back with path report of atypia   .  Cryotherapy    5/91       for persistent condyloma atypia   .  Laser ablation of condylomas    03/05/90 Dr. Warnell Forester       perineal area- also had Bowen's disease   .  Breast surgery  Right  04/2010       breast biopsy for multiple fibroadenoma - no atypia   .  Breast biopsy    1987       Dr. Rosana Hoes      Current Outpatient Prescriptions   Medication  Sig  Dispense  Refill   .  estradiol (ESTRACE) 1 MG tablet  Take 1 tablet (1 mg total) by mouth daily.    .  medroxyprogesterone (PROVERA) 5 MG tablet  Take 1 tablet (10 mg total) by mouth as directed. Take daily .  venlafaxine XR (EFFEXOR XR) 75 MG 24 hr capsule  Take 1 capsule (75 mg total) by mouth daily with breakfast.      Plan:  Will continue HRT as directed by Dr. Quincy Simmonds and still continue to monitor menses record and come back for recheck with her at end of June - call earlier if heavy or prolonged bleeding  Will try increase of Effexor  XL 150 mg daily to see if vaso symptoms are improved.     Consult time:  15 minutes face to face.

## 2013-12-20 NOTE — Progress Notes (Signed)
Encounter reviewed by Dr. Emeree Mahler Silva.  

## 2013-12-21 ENCOUNTER — Ambulatory Visit: Payer: BC Managed Care – PPO | Admitting: Nurse Practitioner

## 2014-01-21 ENCOUNTER — Ambulatory Visit (INDEPENDENT_AMBULATORY_CARE_PROVIDER_SITE_OTHER): Payer: BC Managed Care – PPO | Admitting: Obstetrics and Gynecology

## 2014-01-21 ENCOUNTER — Encounter: Payer: Self-pay | Admitting: Obstetrics and Gynecology

## 2014-01-21 VITALS — BP 110/80 | HR 70 | Ht 65.0 in | Wt 204.8 lb

## 2014-01-21 DIAGNOSIS — N951 Menopausal and female climacteric states: Secondary | ICD-10-CM

## 2014-01-21 MED ORDER — VENLAFAXINE HCL ER 150 MG PO CP24: 150.0000 mg | ORAL_CAPSULE | Freq: Every day | ORAL | Status: DC

## 2014-01-21 MED ORDER — MEDROXYPROGESTERONE ACETATE 5 MG PO TABS: 5.0000 mg | ORAL_TABLET | ORAL | Status: DC

## 2014-01-21 NOTE — Progress Notes (Signed)
Patient ID: Alexis Young, female   DOB: [birth year], 48 y.o.   MRN: [id number] GYNECOLOGY  VISIT   HPI: 48 y.o.   Married  Caucasian  female   G1P1001 with Patient's last menstrual period was 11/07/2013.   here for  3 month follow up.    Patient has had premature menopause and was on HRT Estrace 1 mg daily and Provera 10 mg daily for 7 days per month.  Usually had a withdrawal bleed but for the last couple of months had recurrent bleeding for several days after she has already stopped her withdrawal bleed. This was very inconvenient.  Patient has been on this regimen since age 48 due to premature menopause.  Patient switched to a continuous HRT regimen - Estrace 1 mg daily and Provera 5 mg daily.  Ultrasound showed intramural fibroid 33 mm located in posterior uterus midline.  EMS 4.26. Ovaries WNL. No free fluid.  EMB showed endometrium that was benign and with features of breakdown.   Feels like she is gaining weight. 5 pounds since last visit.  Just got back from trip to [place].   Bled from April 12 - May 3. Spotting only.  None since.  Likes not having a menses.  Hot flashes are getting better.   Increased Effexor for 75 - to 150 mg. This was to help the hot flashes.   GYNECOLOGIC HISTORY: Patient's last menstrual period was 11/07/2013. Contraception:  Tubal  Menopausal hormone therapy: Estradiol 1mg , Progesterone 5mg         OB History   Grav Para Term Preterm Abortions TAB SAB Ect Mult Living   1 1 1       1          Patient Active Problem List   Diagnosis Date Noted  . Uterine fibroid 10/07/2013  . Vasomotor flushing 09/21/2013  . Hair loss 09/21/2013  . 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 Abnormal uterine bleeding (AUB) 09/21/2013    Past Medical History  Diagnosis Date  . Fibroid   . Dyspareunia   . Cancer 03/2013, 06/2013    malignant melanoma on right arm and back  . Premature ovarian failure 12/2008    FSH 137.1, started on HRT    Past Surgical  History  Procedure Laterality Date  . Tubal ligation    . Melanoma excision Right 03/2013    melanoma right arm  . Skin cancer excision  06/2013    midddle of back with path report of atypia  . Cryotherapy  5/91    for persistent condyloma atypia  . Laser ablation of condylomas  03/05/90 Dr. Warnell Forester    perineal area- also had Bowens disease  . Breast surgery Right 04/2010    breast biopsy for multiple fibroadenoma - no atypia  . Breast biopsy  1987    Dr. Rosana Hoes    Current Outpatient Prescriptions  Medication Sig Dispense Refill  . estradiol (ESTRACE) 1 MG tablet Take 1 tablet (1 mg total) by mouth daily.  90 tablet  3  . medroxyPROGESTERone (PROVERA) 5 MG tablet Take 1 tablet (5 mg total) by mouth as directed.  90 tablet  1  . venlafaxine XR (EFFEXOR XR) 150 MG 24 hr capsule Take 1 capsule (150 mg total) by mouth daily with breakfast.  90 capsule  0   No current facility-administered medications for this visit.     ALLERGIES: Asa; Tetracyclines & related; and Sulfa antibiotics  Family History  Problem Relation Age of Onset  . Cancer  Mother     lung  . Alcohol abuse Father     Cirrhosis of liver  . Cancer Maternal Aunt     stomach cancer  . Cancer Maternal Grandmother     uterine cancer    History   Social History  . Marital Status: Married    Spouse Name: N/A    Number of Children: 1  . Years of Education: N/A   Occupational History  .     Social History Main Topics  . Smoking status: Never Smoker   . Smokeless tobacco: Never Used  . Alcohol Use: No  . Drug Use: No  . Sexual Activity: Yes    Partners: Male    Birth Control/ Protection: Surgical     Comment: Tubal   Other Topics Concern  . Not on file   Social History Narrative  . No narrative on file    ROS:  Pertinent items are noted in HPI.  PHYSICAL EXAMINATION:    BP 110/80  Pulse 70  Ht 5\' 5"  (1.651 m)  Wt 204 lb 12.8 oz (92.897 kg)  BMI 34.08 kg/m2  LMP 11/07/2013     General appearance:  alert, cooperative and appears stated age    ASSESSMENT  Menopausal symptoms.  Weight changes.   PLAN  Discussion of reasons for weight gain including lifestyle and medications.  Continue Estradiol 1 mg daily. #90, RF 2. Continue Provera 5 mg daily. #90, RF 2.  Continue Effexor 150 mg XL daily. #90, RF 2.  Discussed lifestyle for weight loss.  Call if unable to loose and want to make medication changes.  Annual exam with [name] in February 2016.    An After Visit Summary was printed and given to the patient.  _20_____ minutes face to face time of which over 50% was spent in counseling.

## 2014-01-21 NOTE — Patient Instructions (Signed)
Exercise to Lose Weight Exercise and a healthy diet may help you lose weight. Your doctor may suggest specific exercises. EXERCISE IDEAS AND TIPS  Choose low-cost things you enjoy doing, such as walking, bicycling, or exercising to workout videos.  Take stairs instead of the elevator.  Walk during your lunch break.  Park your car further away from work or school.  Go to a gym or an exercise class.  Start with 5 to 10 minutes of exercise each day. Build up to 30 minutes of exercise 4 to 6 days a week.  Wear shoes with good support and comfortable clothes.  Stretch before and after working out.  Work out until you breathe harder and your heart beats faster.  Drink extra water when you exercise.  Do not do so much that you hurt yourself, feel dizzy, or get very short of breath. Exercises that burn about 150 calories:  Running 1  miles in 15 minutes.  Playing volleyball for 45 to 60 minutes.  Washing and waxing a car for 45 to 60 minutes.  Playing touch football for 45 minutes.  Walking 1  miles in 35 minutes.  Pushing a stroller 1  miles in 30 minutes.  Playing basketball for 30 minutes.  Raking leaves for 30 minutes.  Bicycling 5 miles in 30 minutes.  Walking 2 miles in 30 minutes.  Dancing for 30 minutes.  Shoveling snow for 15 minutes.  Swimming laps for 20 minutes.  Walking up stairs for 15 minutes.  Bicycling 4 miles in 15 minutes.  Gardening for 30 to 45 minutes.  Jumping rope for 15 minutes.  Washing windows or floors for 45 to 60 minutes. Document Released: 08/17/2010 Document Revised: 10/07/2011 Document Reviewed: 08/17/2010 [employer] Patient Information 2015 [employer]. This information is not intended to replace advice given to you by your health care provider. Make sure you discuss any questions you have with your health care provider.  

## 2014-02-15 ENCOUNTER — Other Ambulatory Visit: Payer: Self-pay | Admitting: Dermatology

## 2014-03-03 ENCOUNTER — Other Ambulatory Visit: Payer: Self-pay | Admitting: Dermatology

## 2014-04-14 ENCOUNTER — Ambulatory Visit (INDEPENDENT_AMBULATORY_CARE_PROVIDER_SITE_OTHER): Payer: BC Managed Care – PPO

## 2014-04-14 VITALS — BP 116/72 | HR 88 | Resp 18

## 2014-04-14 DIAGNOSIS — R52 Pain, unspecified: Secondary | ICD-10-CM

## 2014-04-14 DIAGNOSIS — M19079 Primary osteoarthritis, unspecified ankle and foot: Secondary | ICD-10-CM

## 2014-04-14 DIAGNOSIS — M19071 Primary osteoarthritis, right ankle and foot: Secondary | ICD-10-CM

## 2014-04-14 DIAGNOSIS — M779 Enthesopathy, unspecified: Secondary | ICD-10-CM

## 2014-04-14 DIAGNOSIS — M7750 Other enthesopathy of unspecified foot: Secondary | ICD-10-CM

## 2014-04-14 DIAGNOSIS — M2011 Hallux valgus (acquired), right foot: Secondary | ICD-10-CM

## 2014-04-14 DIAGNOSIS — M778 Other enthesopathies, not elsewhere classified: Secondary | ICD-10-CM

## 2014-04-14 DIAGNOSIS — M2010 Hallux valgus (acquired), unspecified foot: Secondary | ICD-10-CM

## 2014-04-14 MED ORDER — TRIAMCINOLONE ACETONIDE 10 MG/ML IJ SUSP: 10.0000 mg | Freq: Once | INTRAMUSCULAR | Status: DC

## 2014-04-14 MED ORDER — MELOXICAM 15 MG PO TABS: 15.0000 mg | ORAL_TABLET | Freq: Every day | ORAL | Status: DC

## 2014-04-14 NOTE — Patient Instructions (Signed)

## 2014-04-14 NOTE — Progress Notes (Signed)
° °  Subjective:    Patient ID: Alexis Young, female    DOB: August 06, 1965, 48 y.o.   MRN: [id number]  HPI I HAVE SOME PAIN ON THE RIGHT BIG TOE AND HAS BEEN GOING ON FOR A WHILE AND HURTS ALL THE WAY UP MY RIGHT ANKLE AND BURNS AND THROBS FEELS LIKE IT NEEDS TO BE POPPED AND HAS A CRAMP AND A CONSTANT PAIN AND HURTS ON TOP AND BOTTOM AND HURTS WITH SHOES    Review of Systems  HENT: Positive for hearing loss.        Objective:   Physical Exam 48 year old white female well-developed well-nourished oriented x3 presents this time with re\re exacerbation of her problem patient had fasciitis in the past wearing good shoes and orthoses at all times preserving some pain discomfort of her right great toe joint for the last several months is to be getting worse feels like he needs to pop.  Lower extremity objective findings reveal neurovascular status is intact pedal pulses are palpable epicritic and proprioceptive sensations intact and symmetric bilateral normal plantar response DTRs not listed dermatologic the skin color pigment normal hair growth absent nails unremarkable orthopedic exam x-rays reveal rectus foot mild peritubular spurring the first MTP joint noted dorsally and medially there is some increased IM angle greater than 12 hallux abductus angle greater than 20 slight deviation sesamoid position 3-4 there is pain and dorsal flexion plantar flexion at the MTP joint and on palpation of the joint itself no increased temperature no redness no fever chills contralateral left foot is unremarkable no other abnormal findings no open wounds no ulcers no other complications noted       Assessment & Plan:  Assessment capsulitis and mild last arthropathy first MTP joint right secondary to HAV deformity or bunion deformity plan at this time discussed options literature on bunion is dispensed patient may be candidate for future surgery at this time injection tender with Kenalog 20 mg Xylocaine or  equinovarus Marcaine plain infiltrated to the first MTP joint and periarticular and within the joint itself. Patient was also given instructions for daily soaking or are warm compress ice packs ice pack use on a daily basis prescription for meloxicam 15 mg was issued allergic to aspirin is mostly nausea and vomiting not a true allergy if any problems we'll discontinue MOBIC otherwise MOBIC 15 mg once daily reappointed in one month or with in 3 or 4 weeks for followup and reevaluation may be candidate for bunionectomy at some point next progress Harriet Masson DPM

## 2014-05-03 ENCOUNTER — Ambulatory Visit (INDEPENDENT_AMBULATORY_CARE_PROVIDER_SITE_OTHER): Payer: BC Managed Care – PPO | Admitting: Internal Medicine

## 2014-05-03 ENCOUNTER — Other Ambulatory Visit (INDEPENDENT_AMBULATORY_CARE_PROVIDER_SITE_OTHER): Payer: BC Managed Care – PPO

## 2014-05-03 ENCOUNTER — Encounter: Payer: Self-pay | Admitting: Internal Medicine

## 2014-05-03 VITALS — BP 116/62 | HR 79 | Temp 98.1°F | Resp 18 | Ht 66.0 in | Wt 215.8 lb

## 2014-05-03 DIAGNOSIS — R5383 Other fatigue: Secondary | ICD-10-CM | POA: Insufficient documentation

## 2014-05-03 DIAGNOSIS — Z23 Encounter for immunization: Secondary | ICD-10-CM

## 2014-05-03 DIAGNOSIS — K219 Gastro-esophageal reflux disease without esophagitis: Secondary | ICD-10-CM | POA: Insufficient documentation

## 2014-05-03 LAB — CBC
HCT: 39.3 % (ref 36.0–46.0)
Hemoglobin: 13.1 g/dL (ref 12.0–15.0)
MCHC: 33.3 g/dL (ref 30.0–36.0)
MCV: 85 fl (ref 78.0–100.0)
Platelets: 287 10*3/uL (ref 150.0–400.0)
RBC: 4.63 Mil/uL (ref 3.87–5.11)
RDW: 14 % (ref 11.5–15.5)
WBC: 6.5 10*3/uL (ref 4.0–10.5)

## 2014-05-03 LAB — HEMOGLOBIN A1C: Hgb A1c MFr Bld: 5.8 % (ref 4.6–6.5)

## 2014-05-03 MED ORDER — PANTOPRAZOLE SODIUM 40 MG PO TBEC: 40.0000 mg | DELAYED_RELEASE_TABLET | Freq: Every day | ORAL | Status: DC

## 2014-05-03 NOTE — Assessment & Plan Note (Signed)
Starting protonix daily and she can titrate to BID if symptoms not fully resolved with daily dosing.

## 2014-05-03 NOTE — Progress Notes (Signed)
Pre visit review using our clinic review tool, if applicable. No additional management support is needed unless otherwise documented below in the visit note. 

## 2014-05-03 NOTE — Progress Notes (Signed)
   Subjective:    Patient ID: Alexis Young, female    DOB: Apr 01, 1966, 48 y.o.   MRN: [id number]  HPI The patient is a 48 YO female who is coming in today to establish care. She has PMH of menopausal symptoms. She has noticed acid reflux bad the last 6 months or so (during same time on hormones for menopausal symptoms). She has also noticed some fatigue with doing activities (she is able to walk 4 miles daily). Her acid reflux is bad and not helped by OTC meds (temporary relief with tums and partial relief with OTC meds). She denies chest pains, SOB, abdominal pain. She denies diarrhea, constipation. She has been having yearly mammograms and is due in November (she has history of multiple biopsies but last mammogram clear).   Review of Systems  Constitutional: Positive for fatigue. Negative for activity change and appetite change.  Respiratory: Negative for cough, chest tightness, shortness of breath and wheezing.   Cardiovascular: Negative for chest pain, palpitations and leg swelling.  Gastrointestinal: Positive for abdominal pain. Negative for diarrhea, constipation, blood in stool and abdominal distention.  Musculoskeletal: Negative.   Skin: Negative.   Neurological: Negative for dizziness, weakness, light-headedness, numbness and headaches.  Psychiatric/Behavioral: Negative.       Objective:   Physical Exam  Constitutional: She is oriented to person, place, and time. She appears well-developed and well-nourished.  Overweight  HENT:  Head: Normocephalic and atraumatic.  Eyes: EOM are normal.  Neck: Normal range of motion.  Cardiovascular: Normal rate and regular rhythm.   Pulmonary/Chest: Effort normal and breath sounds normal.  Abdominal: Soft. Bowel sounds are normal. She exhibits no distension. There is no tenderness. There is no rebound.  Neurological: She is alert and oriented to person, place, and time. Coordination normal.  Skin: Skin is warm and dry.   Filed Vitals:   05/03/14 1336  BP: 116/62  Pulse: 79  Temp: 98.1 F (36.7 C)  TempSrc: Oral  Resp: 18  Height: 5\' 6"  (1.676 m)  Weight: 215 lb 12.8 oz (97.886 kg)  SpO2: 96%      Assessment & Plan:

## 2014-05-03 NOTE — Patient Instructions (Signed)
We will check some blood work to see if we can find a cause for the lack of energy and stamina.   We have sent in some protonix for you to try for your stomach to see if it does better. You can take it once per day and see if that does well. If not we can try to increase it to twice a day if you are still having symptoms.   You are doing a good job with exercise and maybe you can get off some of the hormones that are increasing your appetite.   Come back in about 1 year if things are going well, if you continue to have problems please call us and come back sooner.   Serving Sizes What we call a serving size today is larger than it was in the past. A 1950s fast-food burger contained little more than 1 oz of meat, and a soft drink was 8 oz (1 cup). Today, a "quarter pounder" burger is at least 4 times that amount, and a 32 or 64 oz drink is not uncommon. A possible guide for eating when trying to lose weight is to eat about half as much as you normally do. Some estimates of serving sizes are:  1 Dairy serving:Individual container of yogurt (8 oz) or piece of cheese the size of your thumb (1 oz).  1 Grain serving: 1 slice of bread or  cup pasta.  1 Meat serving: The size of a deck of cards (3 oz).  1 Fruit serving: cup canned fruit or 1 medium fruit.  1 Vegetable serving:  cup of cooked or canned vegetables.  1 Fat serving:The size of 4 stacked dimes. Experts suggest spending 1 or 2 days measuring food portions you commonly eat. This will give you better practice at estimating serving sizes, and will also show whether you are eating an appropriate amount of food to meet your weight goals. If you find that you are eating more than you thought, try measuring your food for a few days so you can "reprogram" yourself to learn what makes a healthy portion for you. SUGGESTIONS FOR CONTROL  In restaurants, share entrees, or ask the waiter to put half the entre in a box or bag before you even touch  it.  Order lunch-sized portions. Many restaurants serve 4 to 6 oz of meat at lunch, compared with 8 to 10 oz at dinner.  Split dessert or skip it all together. Have a piece of fruit when you get home.  At home, use smaller plates and bowls. It will look as if you are eating more.  Plate your food in the kitchen rather than serving it "family style" at the table.  Wait 20 to 30 minutes before taking seconds. This is how long it takes your brain to recognize that you are full.  Check food labels for serving sizes. Eat 1 serving only.  Use measuring cups and spoons to see proper serving sizes.  Buy smaller packages of candy, popcorn, and snacks.  Avoid eating directly out of the bag or carton.  While eating half as much, exercise twice as much. Park further away from the mall, take the stairs instead of the escalator, and walk around your block. Losing weight is a slow, difficult process. It takes long-lasting lifestyle changes. You can make gradual changes over time so they become habits. Look to friends and family to support the healthy changes you are making. Avoid fad diets since they are often only  temporary weight loss solutions. Document Released: 04/13/2003 Document Revised: 10/07/2011 Document Reviewed: 10/12/2013 [facility] Patient Information 2015 [place], [place]. This information is not intended to replace advice given to you by your health care provider. Make sure you discuss any questions you have with your health care provider.

## 2014-05-03 NOTE — Assessment & Plan Note (Signed)
Checking BMP, HgA1c, TSH, CBC, lipid panel today.

## 2014-05-04 LAB — BASIC METABOLIC PANEL
BUN: 13 mg/dL (ref 6–23)
CO2: 22 mEq/L (ref 19–32)
Calcium: 9.2 mg/dL (ref 8.4–10.5)
Chloride: 107 mEq/L (ref 96–112)
Creatinine, Ser: 0.9 mg/dL (ref 0.4–1.2)
GFR: 68.01 mL/min (ref >60.00)
Glucose, Bld: 79 mg/dL (ref 70–99)
Potassium: 4 mEq/L (ref 3.5–5.1)
Sodium: 141 mEq/L (ref 135–145)

## 2014-05-04 LAB — LIPID PANEL
Cholesterol: 287 mg/dL — ABNORMAL HIGH (ref 0–200)
HDL: 47 mg/dL (ref >39.00)
LDL Cholesterol: 200 mg/dL — ABNORMAL HIGH (ref 0–99)
NonHDL: 240
Total CHOL/HDL Ratio: 6
Triglycerides: 198 mg/dL — ABNORMAL HIGH (ref 0.0–149.0)
VLDL: 39.6 mg/dL (ref 0.0–40.0)

## 2014-05-04 LAB — TSH: TSH: 5.5 u[IU]/mL — ABNORMAL HIGH (ref 0.35–4.50)

## 2014-05-06 ENCOUNTER — Ambulatory Visit: Payer: BC Managed Care – PPO

## 2014-05-30 ENCOUNTER — Encounter: Payer: Self-pay | Admitting: Internal Medicine

## 2014-06-13 ENCOUNTER — Other Ambulatory Visit: Payer: Self-pay

## 2014-06-13 DIAGNOSIS — Z1231 Encounter for screening mammogram for malignant neoplasm of breast: Secondary | ICD-10-CM

## 2014-06-29 ENCOUNTER — Telehealth: Payer: Self-pay | Admitting: Nurse Practitioner

## 2014-06-29 NOTE — Telephone Encounter (Signed)
Spoke with patient. Patient states that she began to have "brown discharge that looks like old blood" on 11/11. LMP was "around" 10/07/13. Patient is currently taking progestin 5mg  and estrogen 1mg  daily. Patient has been having light cramping in lower back and pelvis since 11/11. Denies fevers, nausea, or urinary symptoms. Had [facility] on 10/07/13 with EMB. "I just don't know if I need come back in to be seen or what." Advised patient will need to be seen for evaluation. Patient is agreeable and verbalizes understanding. Appointment scheduled for tomorrow at 11am with Milford Cage, [place]. Patient is agreeable to date and time.  Cc: Dr.Silva  Routing to provider for final review. Patient agreeable to disposition. Will close encounter

## 2014-06-29 NOTE — Telephone Encounter (Signed)
Pt is having unusual discharge like her period is on. Please call to advise.

## 2014-06-30 ENCOUNTER — Encounter: Payer: Self-pay | Admitting: Nurse Practitioner

## 2014-06-30 ENCOUNTER — Ambulatory Visit (INDEPENDENT_AMBULATORY_CARE_PROVIDER_SITE_OTHER): Payer: BC Managed Care – PPO | Admitting: Nurse Practitioner

## 2014-06-30 VITALS — BP 134/76 | HR 76 | Ht 66.0 in | Wt 219.0 lb

## 2014-06-30 DIAGNOSIS — N950 Postmenopausal bleeding: Secondary | ICD-10-CM

## 2014-06-30 MED ORDER — MEDROXYPROGESTERONE ACETATE 10 MG PO TABS: 10.0000 mg | ORAL_TABLET | Freq: Every day | ORAL | Status: DC

## 2014-06-30 NOTE — Progress Notes (Signed)
Subjective:     Patient ID: Alexis Young, female   DOB: February 08, 1966, 48 y.o.   MRN: [id number]  HPI this 48 yo Fe G1P1 with history of POF at age 48 on HRT.  She started on Estradiol and Provera 10 mg on a monthly basis and did well.  She had PUS and endo biopsy in March for prolonged bleeding after the Provera.  All was normal.  She was then put on daily Provera at 5 mg on 10/15/13.  She did well initially without any vaginal bleeding since 11/28/13.  She is now having daily vaginal spotting since 06/08/14.  Some cramps but no real changes with PMS.  No other changes in hormones or other med's.  She is off the Effexor secondary to weight gain.   Review of Systems  Constitutional: Negative for fever, chills, diaphoresis and fatigue.  Respiratory: Negative.   Cardiovascular: Negative.   Gastrointestinal: Negative.   Genitourinary: Positive for vaginal bleeding and menstrual problem.  Musculoskeletal: Negative.   Skin: Negative.   Neurological: Negative.   Psychiatric/Behavioral: Negative.        Objective:   Physical Exam  Constitutional: She is oriented to person, place, and time. She appears well-developed and well-nourished. No distress.  Abdominal: Soft. She exhibits no distension. There is no tenderness. There is no rebound and no guarding.  Genitourinary:  Very light brown vaginal spotting.  No cervicitis. No adnexal mass. Exam is limited due to body habitus.  Musculoskeletal: Normal range of motion.  Neurological: She is alert and oriented to person, place, and time.  Psychiatric: She has a normal mood and affect. Her behavior is normal. Judgment and thought content normal.       Assessment:     AUB on HRT Recent normal PUS/ SHGM / endo biopsy    Plan:     Will try and increase Provera to 10 mg a day along with the Estradiol 1 mg daily to see if AUB will decrease / stop.  Will give [place] a CB first of week if still bleeding.  Will have Dr. Quincy Simmonds to review.

## 2014-06-30 NOTE — Patient Instructions (Signed)

## 2014-07-03 NOTE — Progress Notes (Signed)
Encounter reviewed by Dr. Josefa Half. I agree with the plan for increasing the Provera to 10 mg daily.

## 2014-07-11 ENCOUNTER — Other Ambulatory Visit: Payer: Self-pay

## 2014-07-11 ENCOUNTER — Ambulatory Visit
Admission: RE | Admit: 2014-07-11 | Discharge: 2014-07-11 | Disposition: A | Discharge status: Home / Self Care | Payer: BC Managed Care – PPO | Source: Ambulatory Visit

## 2014-07-11 DIAGNOSIS — Z1231 Encounter for screening mammogram for malignant neoplasm of breast: Secondary | ICD-10-CM

## 2014-08-03 ENCOUNTER — Telehealth: Payer: Self-pay | Admitting: Nurse Practitioner

## 2014-08-03 DIAGNOSIS — N938 Other specified abnormal uterine and vaginal bleeding: Secondary | ICD-10-CM

## 2014-08-03 NOTE — Telephone Encounter (Signed)
Order placed

## 2014-08-03 NOTE — Telephone Encounter (Signed)
Please enter order. Thanks!

## 2014-08-03 NOTE — Telephone Encounter (Signed)
I recommend that the patient have an appointment to see me for a pelvic ultrasound to re-evaluate her fibroid.  I am suspicious that this may be causing the bleeding for her.

## 2014-08-03 NOTE — Addendum Note (Signed)
Addended by: Jasmine Awe on: 08/03/2014 04:39 PM   Modules accepted: Orders

## 2014-08-03 NOTE — Telephone Encounter (Signed)
Spoke with patient. Patient was seen on 06/30/14 with Alexis Cage, FNP. Patient was having vaginal spotting with HRT. At OV patient's Provera was increased from 5mg  to 10mg   Daily and was to continue taking Estradiol 1 mg daily. Patient had normal PUS/SHGM/EMB on 10/07/13. Patient is calling to report since increasing Provera to 10mg  and continuing with Estradiol 1 mg is still having vaginal spotting. "It is like I am having a light period. I have to wear a pad all the time." Advised patient will need to speak with covering provider regarding symptoms and medication change and return call with further recommendations. Patient is agreeable.  Routing to Dr.Silva as Alexis Cage, FNP is out of the office today

## 2014-08-03 NOTE — Telephone Encounter (Signed)
Spoke with patient. Advised patient of message as seen below from [place]. Patient is agreeable and would like to schedule at this time. Appointment for PUS scheduled for 08/11/14 at 10:30am with 11am consult with Dr.Silva. Agreeable to date and time. Order will need precert.  Cc: Felipa Emory  Routing to provider for final review. Patient agreeable to disposition. Will close encounter

## 2014-08-03 NOTE — Telephone Encounter (Signed)
Patient calling to speak with the nurse about "continuing to have discharge even though my hormones were recently changed." She says she was told to call if the problem continued.

## 2014-08-05 NOTE — Telephone Encounter (Signed)
PR: $172.48

## 2014-08-05 NOTE — Telephone Encounter (Signed)
Spoke with patient. Advised that per benefit quote received, she will be responsible to pay $172.48 when she comes in for procedure. Patient agreeable.

## 2014-08-11 ENCOUNTER — Other Ambulatory Visit: Payer: Self-pay | Admitting: Obstetrics and Gynecology

## 2014-08-11 ENCOUNTER — Other Ambulatory Visit: Payer: Self-pay

## 2014-08-11 ENCOUNTER — Telehealth: Payer: Self-pay | Admitting: Obstetrics and Gynecology

## 2014-08-11 NOTE — Telephone Encounter (Signed)
Spoke with patient. Appointment for PUS rescheduled for 2/18 at 9:30am with 10am consult with Dr.Silva. Agreeable to date and time.  Routing to provider for final review. Patient agreeable to disposition. Will close encounter

## 2014-08-11 NOTE — Telephone Encounter (Signed)
Called and spoke with patient to cancel her PUS for today due to our sonographer having to leave for the day. Patient understanding. Please call patient to reschedule.

## 2014-08-11 NOTE — Telephone Encounter (Signed)
Call to patient to reschedule PUS. Left message for patient to call back.

## 2014-09-15 ENCOUNTER — Ambulatory Visit (INDEPENDENT_AMBULATORY_CARE_PROVIDER_SITE_OTHER): Payer: BLUE CROSS/BLUE SHIELD | Admitting: Obstetrics and Gynecology

## 2014-09-15 ENCOUNTER — Encounter: Payer: Self-pay | Admitting: Obstetrics and Gynecology

## 2014-09-15 ENCOUNTER — Ambulatory Visit (INDEPENDENT_AMBULATORY_CARE_PROVIDER_SITE_OTHER): Payer: BLUE CROSS/BLUE SHIELD

## 2014-09-15 VITALS — BP 116/64 | HR 72 | Ht 66.0 in | Wt 205.0 lb

## 2014-09-15 DIAGNOSIS — D259 Leiomyoma of uterus, unspecified: Secondary | ICD-10-CM

## 2014-09-15 DIAGNOSIS — N950 Postmenopausal bleeding: Secondary | ICD-10-CM

## 2014-09-15 DIAGNOSIS — N938 Other specified abnormal uterine and vaginal bleeding: Secondary | ICD-10-CM

## 2014-09-15 NOTE — Patient Instructions (Signed)
We will call you about benefits for the endometrial biopsy.  I will see you in about 2 weeks.

## 2014-09-15 NOTE — Progress Notes (Signed)
Subjective 49 yo Caucasian Female G1P1  Patient is here today for pelvic ultrasound for re-evaluation of postmenopausal bleeding on HRT.  Patient has diagnosis of premature menopause since age 41 and has been on various regimens of HRT.  Most recently was switched from Estradiol 1 mg daily and Provera 5 mg daily to Estradiol 1 mg daily and Provera 10 mg daily. Bleeding pattern has been:  Bled from 06/20/14 - 09/07/14.  On 10/07/13, patient had evaluation for postmenopausal bleeding.  Ultrasound showing intramural fibroid 33 mm located in posterior uterus midline.  EMS 4.26. Ovaries WNL. No free fluid. Sonohysterogram showed no filling defects.  EMB showed endometrium that was benign and with features of breakdown.   History of one Cesarean Section for failure to progress. Status post bilateral tubal ligation.   Family history of maternal uterine cancer.  Maternal great aunt and with colon cancer.  No family history of breast or ovarian cancer.   Objective  Pelvic ultrasound and images reviewed with patient.   Uterus with 2 fibroids - 3.28 and 1.4 cm.  The larger fibroid appears to be encroaching on the endometrium. EMS 3.71 mm. Ovaries normal.  No free fluid.       Assessment   Postmenopausal bleeding on HRT.  Uterine fibroids.  One likely encroaching on endometrium.  Premature menopause.  History of C/S and BTL.   Plan  Discussed fibroids.  Stop HRT.  Return for EMB and blood work in about 2 weeks.  I will test [facility] and estradiol to see if patient is still in a menopausal range.  Discussed dilation and curettage, stopping hormonal therapy, or robotic hysterectomy for bleeding.  DVD to patient about robotic hysterectomy.   25 minutes face to face time of which over 50% was spent in counseling.   After visit summary to patient.

## 2014-09-19 ENCOUNTER — Telehealth: Payer: Self-pay | Admitting: Obstetrics and Gynecology

## 2014-09-19 NOTE — Telephone Encounter (Signed)
Left message for patient to call back. Need to go over benefits and schedule EMB.

## 2014-09-26 ENCOUNTER — Ambulatory Visit: Payer: BC Managed Care – PPO | Admitting: Nurse Practitioner

## 2014-09-30 ENCOUNTER — Ambulatory Visit: Payer: BLUE CROSS/BLUE SHIELD | Admitting: Obstetrics and Gynecology

## 2014-10-05 ENCOUNTER — Ambulatory Visit (INDEPENDENT_AMBULATORY_CARE_PROVIDER_SITE_OTHER): Payer: BLUE CROSS/BLUE SHIELD | Admitting: Obstetrics and Gynecology

## 2014-10-05 ENCOUNTER — Encounter: Payer: Self-pay | Admitting: Obstetrics and Gynecology

## 2014-10-05 DIAGNOSIS — N950 Postmenopausal bleeding: Secondary | ICD-10-CM

## 2014-10-05 NOTE — Progress Notes (Signed)
Patient ID: Alexis Young, female   DOB: 1966/06/24, 49 y.o.   MRN: [id number] GYNECOLOGY  VISIT   HPI: 49 y.o.   Married  Caucasian  female   G1P1001 with Patient's last menstrual period was 06/20/2014.   here for endometrial biopsy for evaluation of postmenopausal bleeding on HRT.   Patient has diagnosis of premature menopause since age 4 and has been on various regimens of HRT.  Most recently was switched from Estradiol 1 mg daily and Provera 5 mg daily to Estradiol 1 mg daily and Provera 10 mg daily. Bleeding pattern has been: Bled from 06/20/14 - 09/07/14. Stopped HRT 09/15/14. Had [facility] and estradiol drawn today. Some cramping like menses will start, but no bleeding. Having hot flashes both on and off HRT.  On 10/07/13, patient had evaluation for postmenopausal bleeding.  Ultrasound showing intramural fibroid 33 mm located in posterior uterus midline.  EMS 4.26. Ovaries WNL. No free fluid. Sonohysterogram showed no filling defects.  EMB showed endometrium that was benign and with features of breakdown.   Pelvic ultrasound  09/15/14 -  Uterus with 2 fibroids - 3.28 and 1.4 cm. The larger fibroid appears to be encroaching on the endometrium. EMS 3.71 mm. Ovaries normal.  No free fluid.   History of one Cesarean Section for failure to progress. Status post bilateral tubal ligation.   Family history of maternal uterine cancer.  Maternal great aunt and with colon cancer.  No family history of breast or ovarian cancer.   GYNECOLOGIC HISTORY: Patient's last menstrual period was 06/20/2014. Contraception:  Tubal  Menopausal hormone therapy: None        OB History    Gravida Para Term Preterm AB TAB SAB Ectopic Multiple Living   1 1 1       1          Patient Active Problem List   Diagnosis Date Noted  . Fatigue 05/03/2014  . GERD (gastroesophageal reflux disease) 05/03/2014  . Uterine fibroid 10/07/2013  . Vasomotor flushing 09/21/2013  . Hair loss 09/21/2013   . 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 Abnormal uterine bleeding (AUB) 09/21/2013    Past Medical History  Diagnosis Date  . Fibroid   . Dyspareunia   . Cancer 03/2013, 06/2013    malignant melanoma on right arm and back  . 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 Tubal ligation    . Melanoma excision Right 03/2013    melanoma right arm  . Skin cancer excision  06/2013    midddle of back with path report of atypia  . Cryotherapy  5/91    for persistent condyloma atypia  . Laser ablation of condylomas  03/05/90 Dr. Warnell Forester    perineal area- also had Bowens disease  . Breast surgery Right 04/2010    breast biopsy for multiple fibroadenoma - no atypia  . Breast biopsy  1987    Dr. Rosana Hoes  . Cesarean section    . Tubal ligation Bilateral     Current Outpatient Prescriptions  Medication Sig Dispense Refill  . Omega-3 Fatty Acids (FISH OIL PO) Take 1,250 mg by mouth at bedtime.    . pantoprazole (PROTONIX) 40 MG tablet Take 1 tablet (40 mg total) by mouth daily. 30 tablet 3   No current facility-administered medications for this visit.     ALLERGIES: [place]; Tetracyclines & related; and Sulfa antibiotics  Family History  Problem Relation Age of Onset  . Cancer Mother     lung  . Alcohol abuse Father     Cirrhosis of liver  . Cancer Maternal Aunt     stomach cancer  . Cancer Maternal Grandmother     uterine cancer    History   Social History  . Marital Status: Married    Spouse Name: N/A  . Number of Children: 1  . Years of Education: N/A   Occupational History  .     Social History Main Topics  . Smoking status: Never Smoker   . Smokeless tobacco: Never Used  . Alcohol Use: No  . Drug Use: No  . Sexual Activity:    Partners: Male    Birth Control/ Protection: Surgical     Comment: Tubal   Other Topics Concern  . Not on file   Social History Narrative    ROS:  Pertinent  items are noted in HPI.  PHYSICAL EXAMINATION:    BP 102/76 mmHg  Pulse 60  Resp 16  Ht 5\' 6"  (1.676 m)  Wt 201 lb (91.173 kg)  BMI 32.46 kg/m2  LMP 06/20/2014     General appearance: alert, cooperative and appears stated age   Procedure - endometrial biopsy Consent performed. Speculum place in vagina.  Sterile prep of cervix with Hibiclens.  Pipelle placed to  8        cm without difficulty twice. Tissue obtained and sent to pathology. Speculum removed.  No complications. Minimal EBL.  ASSESSMENT  History of premature menopause.  Postmenopausal bleeding on HRT.  Uterine fibroids, likely a partially submucous fibroid is the cause of bleeding while on HRT.  Absence of bleeding off HRT (done for blood work check today.)  PLAN  Confirmatory FSH and estradiol today.  EMB performed.  Instructions and precautions given.  Discussion of robotic total laparoscopic hysterectomy versus discontinuation of HRT.  Patient strongly considering hysterectomy.  Needs to have annual examination as well.   15 minutes face to face time of which over 50% was spent in counseling.   After visit summary to patient.

## 2014-10-05 NOTE — Patient Instructions (Signed)
Endometrial Biopsy, Care After Refer to this sheet in the next few weeks. These instructions provide you with information on caring for yourself after your procedure. Your health care provider may also give you more specific instructions. Your treatment has been planned according to current medical practices, but problems sometimes occur. Call your health care provider if you have any problems or questions after your procedure. WHAT TO EXPECT AFTER THE PROCEDURE After your procedure, it is typical to have the following:  You may have mild cramping and a small amount of vaginal bleeding for a few days after the procedure. This is normal. HOME CARE INSTRUCTIONS  Only take over-the-counter or prescription medicine as directed by your health care provider.  Do not douche, use tampons, or have sexual intercourse until your health care provider approves.  Follow your health care provider's instructions regarding any activity restrictions, such as strenuous exercise or heavy lifting. SEEK MEDICAL CARE IF:  You have heavy bleeding or bleeding longer than 2 days after the procedure.  You have bad smelling drainage from your vagina.  You have a fever and chills.  Youhave severe lower stomach (abdominal) pain. SEEK IMMEDIATE MEDICAL CARE IF:  You have severe cramps in your stomach or back.  You pass large blood clots.  Your bleeding increases.  You become weak or lightheaded, or you pass out. Document Released: 05/05/2013 Document Reviewed: 05/05/2013 [employer] Patient Information 2015 [employer]. This information is not intended to replace advice given to you by your health care provider. Make sure you discuss any questions you have with your health care provider.  

## 2014-10-06 LAB — FOLLICLE STIMULATING HORMONE: FSH: 115 m[IU]/mL

## 2014-10-06 LAB — ESTRADIOL: Estradiol: 23.7 pg/mL

## 2014-10-07 LAB — IPS OTHER TISSUE BIOPSY

## 2014-10-10 ENCOUNTER — Telehealth: Payer: Self-pay

## 2014-10-10 NOTE — Telephone Encounter (Signed)
Spoke with patient. Advised of message as seen below from [place]. Patient is agreeable and verbalizes understanding. Offered patient appointment on 3/17 with Dr.Silva but patient declines. "I would really like my husband to be there." Requesting appointment on 3/30 in the morning. Appointment scheduled for 3/30 at 10:15am with Dr.Silva. Patient is agreeable to date and time.  Routing to provider for final review. Patient agreeable to disposition. Will close encounter

## 2014-10-10 NOTE — Telephone Encounter (Signed)
Spoke with patient. Advised patient of message and results as seen below from [place]. Patient states "I had a little bit of spotting yesterday and I have a pressure. I am really considering the hysterectomy but I do not know what is best. I just do no know what to do from here. We have been dealing with this for almost a year and I feel like that is my best option but would like to know her opinion from a medical stand point." Advised patient will provide Dr.Silva an update and return call with further recommendations. Patient is agreeable.

## 2014-10-10 NOTE — Telephone Encounter (Signed)
-----   Message from Nunzio Cobbs, MD sent at 10/09/2014  5:02 PM EDT ----- Please contact patient with EMB results which showed benign proliferative endometrium.  There was no sign of endometrial hyperplasia or cancer.   I had asked the patient to think about what she would like to do next as she has had postmenopausal bleeding on different HRT regimens.  Cause, as we discussed,  is likely the fibroid pushing into her endometrial cavity.  We discussed her stopping the HRT or considering hysterectomy.   Please report back to me her thoughts and choice.  Thanks.  Cc- Marisa Sprinkles

## 2014-10-10 NOTE — Telephone Encounter (Signed)
Please have patient make an appointment with me and we will discuss further.

## 2014-10-19 ENCOUNTER — Encounter: Payer: Self-pay | Admitting: Internal Medicine

## 2014-10-20 ENCOUNTER — Other Ambulatory Visit: Payer: Self-pay | Admitting: Geriatric Medicine

## 2014-10-20 MED ORDER — PANTOPRAZOLE SODIUM 40 MG PO TBEC: 40.0000 mg | DELAYED_RELEASE_TABLET | Freq: Every day | ORAL | Status: DC

## 2014-10-26 ENCOUNTER — Ambulatory Visit (INDEPENDENT_AMBULATORY_CARE_PROVIDER_SITE_OTHER): Payer: BLUE CROSS/BLUE SHIELD | Admitting: Obstetrics and Gynecology

## 2014-10-26 ENCOUNTER — Encounter: Payer: Self-pay | Admitting: Obstetrics and Gynecology

## 2014-10-26 VITALS — BP 124/66 | HR 60 | Resp 18 | Ht 66.0 in | Wt 199.0 lb

## 2014-10-26 DIAGNOSIS — N950 Postmenopausal bleeding: Secondary | ICD-10-CM

## 2014-10-26 DIAGNOSIS — D259 Leiomyoma of uterus, unspecified: Secondary | ICD-10-CM | POA: Diagnosis not present

## 2014-10-26 MED ORDER — MEDROXYPROGESTERONE ACETATE 5 MG PO TABS: 5.0000 mg | ORAL_TABLET | Freq: Every day | ORAL | Status: DC

## 2014-10-26 MED ORDER — ESTRADIOL 1 MG PO TABS: 1.0000 mg | ORAL_TABLET | Freq: Every day | ORAL | Status: DC

## 2014-10-26 NOTE — Progress Notes (Signed)
GYNECOLOGY  VISIT   HPI: 49 y.o.   Married  Caucasian  female   G1P1001 with Patient's last menstrual period was 10/13/2014 (approximate).   here for   Consult - Surgery options for treatment of postmenopausal bleeding, Husband present for the entire consultation today.   Patient has diagnosis of premature menopause since age 33 and has been on various regimens of HRT.  Most recently was switched from Estradiol 1 mg daily and Provera 5 mg daily to Estradiol 1 mg daily and Provera 10 mg daily to try to regulate and cease vaginal bleeding. More recent bleeding pattern has been: Bled from 06/20/14 - 09/07/14.  Has had evaluations for her bleeding during the last one year.  The focus has been a developing picture of a uterine fibroid which appears to be encroaching on the endometrial cavity and the continued irregular bleeding despite changing HRT regimens.  Stopped HRT 09/15/14 in order to retest hormones. Had [facility] and estradiol drawn 10/05/14 - 115. Having hot flashes both on and off HRT. Continue to be off HRT today.  States night sweats are terrible.   Pelvic ultrasound 09/15/14 - Uterus with 2 fibroids - 3.28 and 1.4 cm. The larger fibroid appears to be encroaching on the endometrium. EMS 3.71 mm. Ovaries normal.  No free fluid.   EMB 10/05/14 - weakly proliferative endometrium with no hyperplasia or malignancy.  Had bleeding around 10/12/14.  History of one Cesarean Section for failure to progress. Status post bilateral tubal ligation.   Family history of maternal grandmother with uterine cancer.  Maternal great aunt and with colon cancer.  No family history of breast or ovarian cancer.   GYNECOLOGIC HISTORY: Patient's last menstrual period was 10/13/2014 (approximate). Contraception:   Tubal Ligation Menopausal hormone therapy: None        OB History    Gravida Para Term Preterm AB TAB SAB Ectopic Multiple Living   1 1 1       1          Patient Active Problem List    Diagnosis Date Noted  . Fatigue 05/03/2014  . GERD (gastroesophageal reflux disease) 05/03/2014  . Uterine fibroid 10/07/2013  . Vasomotor flushing 09/21/2013  . Hair loss 09/21/2013  . 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 Abnormal uterine bleeding (AUB) 09/21/2013    Past Medical History  Diagnosis Date  . Fibroid   . Dyspareunia   . Cancer 03/2013, 06/2013    malignant melanoma on right arm and back  . 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 Tubal ligation    . Melanoma excision Right 03/2013    melanoma right arm  . Skin cancer excision  06/2013    midddle of back with path report of atypia  . Cryotherapy  5/91    for persistent condyloma atypia  . Laser ablation of condylomas  03/05/90 Dr. Warnell Forester    perineal area- also had Bowens disease  . Breast surgery Right 04/2010    breast biopsy for multiple fibroadenoma - no atypia  . Breast biopsy  1987    Dr. Rosana Hoes  . Cesarean section    . Tubal ligation Bilateral     Current Outpatient Prescriptions  Medication Sig Dispense Refill  . Omega-3 Fatty Acids (FISH OIL PO) Take 1,250 mg by mouth at bedtime.    . pantoprazole (PROTONIX) 40 MG tablet Take 1 tablet (40 mg total) by  mouth daily. 30 tablet 3   No current facility-administered medications for this visit.     ALLERGIES: Asa; Tetracyclines & related; and Sulfa antibiotics  Family History  Problem Relation Age of Onset  . Cancer Mother     lung  . Alcohol abuse Father     Cirrhosis of liver  . Cancer Maternal Aunt     stomach cancer  . Cancer Maternal Grandmother     uterine cancer    History   Social History  . Marital Status: Married    Spouse Name: N/A  . Number of Children: 1  . Years of Education: N/A   Occupational History  .     Social History Main Topics  . Smoking status: Never Smoker   . Smokeless tobacco: Never Used  . Alcohol Use: No  . Drug Use: No  .  Sexual Activity:    Partners: Male    Birth Control/ Protection: Surgical     Comment: Tubal   Other Topics Concern  . Not on file   Social History Narrative    ROS:  Pertinent items are noted in HPI.  PHYSICAL EXAMINATION:    BP 124/66 mmHg  Pulse 60  Resp 18  Ht 5\' 6"  (1.676 m)  Wt 199 lb (90.266 kg)  BMI 32.13 kg/m2  LMP 10/13/2014 (Approximate)     General appearance: alert, cooperative and appears stated age   ASSESSMENT  Postmenopausal bleeding on HRT.  Uterine fibroids, encroaching on endometrium.  One episode of bleeding just after EMB while was off HRT.   History of Cesarean Section and BTL. Premature menopause.  Off HRT and is symptomatic.   PLAN  Discussed risks and benefits of HRT. Patient understands that with stopping the HRT she may have no further postmenopausal bleeding.  Discussed premature menopause and the effects of this with respect to HRT. Patient chooses to restart her HRT - Provera 5 mg daily and Estradiol 1 mg daily.  See orders. Discussed fibroids.  Discussion of hysterectomy with bilateral salpingectomy and possible bilateral salpingo-oophorectomy including the robotic approach.  Alternatives, benefits and risks discussed including but not limited to bleeding, infection, damage to surrounding organs, reaction to anesthesia, DVT, PE, death, incisional hernias, need for further surgery.   Surgical expectations and recovery discussed. Patient is considering this option and would like insurance precert and potential scheduling. Written materials to patient about hysterectomy and about robotic hysterectomy.  She knows to call for vaginal bleeding so I can be aware.  She is restarting HRT. Return prn.   An After Visit Summary was printed and given to the patient.  ___40___ minutes face to face time of which over 50% was spent in counseling.

## 2014-11-01 ENCOUNTER — Telehealth: Payer: Self-pay | Admitting: *Deleted

## 2014-11-01 NOTE — Telephone Encounter (Signed)
Call to patient to discuss surgery. Date options and scheduling policies discussed. Patient very undecided at this point. Answered several questions regarding recovery time and available dates.  Patient considering options and will discuss with husband and call me back with decision.

## 2014-11-18 NOTE — Telephone Encounter (Signed)
Call to patient, Per ROI, ok to leave message on cell number. Left message calling to follow-up with her.Left message to call back with update.

## 2014-11-18 NOTE — Telephone Encounter (Signed)
Return call to Sally. °

## 2014-11-18 NOTE — Telephone Encounter (Signed)
Call back to patient. She states she and husband were just discussing this last night and she is ready to proceed but is trying to decide on what timing works with her schedule. Discussed available dates and scheduling policies. Patient to discuss dates with her husband and call me back on Monday. She is still looking at June or July.  Routing to provider for final review. Patient agreeable to disposition. Will close encounter

## 2014-12-20 ENCOUNTER — Encounter: Payer: Self-pay | Admitting: Nurse Practitioner

## 2014-12-20 ENCOUNTER — Ambulatory Visit (INDEPENDENT_AMBULATORY_CARE_PROVIDER_SITE_OTHER): Payer: BLUE CROSS/BLUE SHIELD | Admitting: Nurse Practitioner

## 2014-12-20 VITALS — BP 128/74 | HR 68 | Ht 65.0 in | Wt 198.0 lb

## 2014-12-20 DIAGNOSIS — Z01419 Encounter for gynecological examination (general) (routine) without abnormal findings: Secondary | ICD-10-CM | POA: Diagnosis not present

## 2014-12-20 DIAGNOSIS — R319 Hematuria, unspecified: Secondary | ICD-10-CM | POA: Diagnosis not present

## 2014-12-20 DIAGNOSIS — Z0000 Encounter for general adult medical examination without abnormal findings: Secondary | ICD-10-CM | POA: Diagnosis not present

## 2014-12-20 LAB — POCT URINALYSIS DIPSTICK
Bilirubin, UA: NEGATIVE
Glucose, UA: NEGATIVE
Ketones, UA: NEGATIVE
Leukocytes, UA: NEGATIVE
Nitrite, UA: NEGATIVE
Protein, UA: NEGATIVE
Urobilinogen, UA: NEGATIVE
pH, UA: 5

## 2014-12-20 LAB — COMPREHENSIVE METABOLIC PANEL
ALT: 10 U/L (ref 0–35)
AST: 12 U/L (ref 0–37)
Albumin: 4.5 g/dL (ref 3.5–5.2)
Alkaline Phosphatase: 66 U/L (ref 39–117)
BUN: 12 mg/dL (ref 6–23)
CO2: 23 mEq/L (ref 19–32)
Calcium: 9.6 mg/dL (ref 8.4–10.5)
Chloride: 107 mEq/L (ref 96–112)
Creat: 1.02 mg/dL (ref 0.50–1.10)
Glucose, Bld: 89 mg/dL (ref 70–99)
Potassium: 4.5 mEq/L (ref 3.5–5.3)
Sodium: 142 mEq/L (ref 135–145)
Total Bilirubin: 0.7 mg/dL (ref 0.2–1.2)
Total Protein: 7.2 g/dL (ref 6.0–8.3)

## 2014-12-20 LAB — HEMOGLOBIN A1C
Hgb A1c MFr Bld: 5.7 % — ABNORMAL HIGH (ref <5.7)
Mean Plasma Glucose: 117 mg/dL — ABNORMAL HIGH (ref <117)

## 2014-12-20 LAB — LIPID PANEL
Cholesterol: 209 mg/dL — ABNORMAL HIGH (ref 0–200)
HDL: 44 mg/dL — ABNORMAL LOW (ref >=46)
LDL Cholesterol: 142 mg/dL — ABNORMAL HIGH (ref 0–99)
Total CHOL/HDL Ratio: 4.8 Ratio
Triglycerides: 114 mg/dL (ref <150)
VLDL: 23 mg/dL (ref 0–40)

## 2014-12-20 LAB — TSH: TSH: 3.676 u[IU]/mL (ref 0.350–4.500)

## 2014-12-20 MED ORDER — ESTRADIOL 1 MG PO TABS: 1.0000 mg | ORAL_TABLET | Freq: Every day | ORAL | Status: DC

## 2014-12-20 MED ORDER — MEDROXYPROGESTERONE ACETATE 5 MG PO TABS: 5.0000 mg | ORAL_TABLET | Freq: Every day | ORAL | Status: DC

## 2014-12-20 NOTE — Progress Notes (Signed)
Patient ID: Alexis Young, female   DOB: April 07, 1966, 49 y.o.   MRN: [id number] 49 y.o. G68P1001 Married  Caucasian Fe here for annual exam.  PMB with PUS and endo biopsy 09/2013. Then HRT was changed to Provera 5 mg and had daily spotting since 11/28/13.  Then HRT was changed again to Provera 10 mg and Estradiol 1 mg daily.  continued with bleeding from 06/20/14 - 09/07/14  Then 09/15/14 had repeat PUS with  Fibroid encroaching on the endometrium.  Endo biopsy 10/05/14 showed benign proliferative endo.  Considering surgical options.  Patient's last menstrual period was 10/12/2014 (exact date).          Sexually active: Yes.    female  The current method of family planning is tubal ligation.    Exercising: Yes.    walking Smoker:  no  Health Maintenance: Pap:  08/29/11 nl;neg HR HPV MMG:  07/11/14 dense/nl;[facility] Colonoscopy:  03/2002 NL TDaP:  12/2006 Labs: PCP   reports that she has never smoked. She has never used smokeless tobacco. She reports that she does not drink alcohol or use illicit drugs.  Past Medical History  Diagnosis Date  . Fibroid   . Dyspareunia   . Cancer 03/2013, 06/2013    malignant melanoma on right arm and back  . 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 Tubal ligation    . Melanoma excision Right 03/2013    melanoma right arm  . Skin cancer excision  06/2013    midddle of back with path report of atypia  . Cryotherapy  5/91    for persistent condyloma atypia  . Laser ablation of condylomas  03/05/90 Dr. Warnell Young    perineal area- also had Bowens disease  . Breast surgery Right 04/2010    breast biopsy for multiple fibroadenoma - no atypia  . Breast biopsy  1987    Dr. Rosana Young  . Cesarean section    . Tubal ligation Bilateral     Current Outpatient Prescriptions  Medication Sig Dispense Refill  . estradiol (ESTRACE) 1 MG tablet Take 1 tablet (1 mg total) by mouth daily. 90 tablet 2   . medroxyPROGESTERone (PROVERA) 5 MG tablet Take 1 tablet (5 mg total) by mouth daily. 90 tablet 2  . Omega-3 Fatty Acids (FISH OIL PO) Take 1,250 mg by mouth at bedtime.    . pantoprazole (PROTONIX) 40 MG tablet Take 1 tablet (40 mg total) by mouth daily. 30 tablet 3   No current facility-administered medications for this visit.    Family History  Problem Relation Age of Onset  . Cancer Mother     lung  . Alcohol abuse Father     Cirrhosis of liver  . Cancer Maternal Aunt     stomach cancer  . Cancer Maternal Grandmother     uterine cancer    ROS:  Pertinent items are noted in HPI.  Otherwise, a comprehensive ROS was negative.  Exam:   BP 128/74 mmHg  Pulse 68  Ht 5\' 5"  (1.651 m)  Wt 198 lb (89.812 kg)  BMI 32.95 kg/m2  LMP 10/12/2014 (Exact Date) Height: 5\' 5"  (165.1 cm) Ht Readings from Last 3 Encounters:  12/20/14 5\' 5"  (1.651 m)  10/26/14 5\' 6"  (1.676 m)  10/05/14 5\' 6"  (1.676 m)    General appearance: alert, cooperative and appears stated age Head: Normocephalic, without obvious abnormality, atraumatic Neck:  no adenopathy, supple, symmetrical, trachea midline and thyroid normal to inspection and palpation Lungs: clear to auscultation bilaterally Breasts: normal appearance, no masses or tenderness Heart: regular rate and rhythm Abdomen: soft, non-tender; no masses,  no organomegaly Extremities: extremities normal, atraumatic, no cyanosis or edema Skin: Skin color, texture, turgor normal. No rashes or lesions Lymph nodes: Cervical, supraclavicular, and axillary nodes normal. No abnormal inguinal nodes palpated Neurologic: Grossly normal   Pelvic: External genitalia:  no lesions              Urethra:  normal appearing urethra with no masses, tenderness or lesions              Bartholin's and Skene's: normal                 Vagina: normal appearing vagina with normal color and discharge, no lesions              Cervix: anteverted              Pap taken: Yes.    Bimanual Exam:  Uterus:  normal size, contour, position, consistency, mobility, non-tender              Adnexa: no mass, fullness, tenderness               Rectovaginal: Confirms               Anus:  normal sphincter tone, no lesions  Chaperone present: Yes  A:  Well Woman with normal exam  History of POF and on HRT since 2010 PMB with endo biopsy and PUS Considering hysterectomy - and wants to speak with Alexis Young to schedule   P:   Reviewed health and wellness pertinent to exam  Pap smear as above  Mammogram is due 12/16  Did give a refill on HRT for 90 days pending surgery.  Counseled with risk of DVT, CVA, cancer, etc.  Alexis Young given information to schedule her surgery  Will follow with labs  Counseled on breast self exam, mammography screening, use and side effects of HRT, adequate intake of calcium and vitamin D, diet and exercise, Kegel's exercises return annually or prn  An After Visit Summary was printed and given to the patient.

## 2014-12-20 NOTE — Patient Instructions (Signed)

## 2014-12-21 LAB — URINALYSIS, MICROSCOPIC ONLY
Casts: NONE SEEN
Crystals: NONE SEEN

## 2014-12-21 LAB — VITAMIN D 25 HYDROXY (VIT D DEFICIENCY, FRACTURES): Vit D, 25-Hydroxy: 26 ng/mL — ABNORMAL LOW (ref 30–100)

## 2014-12-22 ENCOUNTER — Telehealth: Payer: Self-pay | Admitting: Nurse Practitioner

## 2014-12-22 NOTE — Telephone Encounter (Signed)
Patient is ready to schedule surgery

## 2014-12-23 LAB — IPS PAP TEST WITH HPV

## 2014-12-25 NOTE — Progress Notes (Signed)
Encounter reviewed by Dr. Josefa Half. Surgical plan would be robotic total laparoscopic hysterectomy with bilateral salpingectomy and possible bilateral salpingo-oophorectomy with cystoscopy.  History of prior Cesarean Section and no vaginal deliveries.

## 2014-12-27 NOTE — Telephone Encounter (Signed)
Call back to patient to confirm surgery is scheduled for 02-14-15 at 0730 at [facility]. Surgery instruction sheet reviewed with patient and printed copy mailed. See scanned copy. Consult with Dr Quincy Simmonds on 01-26-15. Patient just had annual exam with [name] FNP on 12-20-14.  Routing to provider for final review. Patient agreeable to disposition. Will close encounter.

## 2014-12-27 NOTE — Telephone Encounter (Signed)
Return call to patient. States she is ready to proceed with scheduling surgery. Discussed this further with Patty at annual exam and thinks she is ready to move forward. Discussed available dates and reviewed scheduling policies. This has been discussed with patient previously.  She would like to proceed with July 19 date. Advised with schedule and call patient back to confirm.  Case request sent to Central Scheduling.

## 2015-01-26 ENCOUNTER — Encounter: Payer: Self-pay | Admitting: Obstetrics and Gynecology

## 2015-01-26 ENCOUNTER — Ambulatory Visit (INDEPENDENT_AMBULATORY_CARE_PROVIDER_SITE_OTHER): Payer: BLUE CROSS/BLUE SHIELD | Admitting: Obstetrics and Gynecology

## 2015-01-26 VITALS — BP 126/82 | HR 76 | Ht 65.0 in | Wt 197.2 lb

## 2015-01-26 DIAGNOSIS — D259 Leiomyoma of uterus, unspecified: Secondary | ICD-10-CM | POA: Diagnosis not present

## 2015-01-26 DIAGNOSIS — Z8742 Personal history of other diseases of the female genital tract: Secondary | ICD-10-CM

## 2015-01-26 MED ORDER — FLUCONAZOLE 150 MG PO TABS: 150.0000 mg | ORAL_TABLET | Freq: Once | ORAL | Status: DC

## 2015-01-26 NOTE — Progress Notes (Signed)
GYNECOLOGY  VISIT   HPI: 49 y.o.   Married  Caucasian  female   G1P1001 with Patient's last menstrual period was 10/15/2014.   here for surgery consult Bleeding on different HRT regimens but none in the last three months.  Occasional cramping.  Desires hysterectomy with removal of tubes and ovaries.   Patient has diagnosis of premature menopause since age 61 and has been on various regimens of HRT.  Most recently was switched from Estradiol 1 mg daily and Provera 5 mg daily to Estradiol 1 mg daily and Provera 10 mg daily to try to regulate and cease vaginal bleeding. More recent bleeding pattern has been: Bled from 06/20/14 - 09/07/14.  Has had evaluations for her bleeding during the last one year.  The focus has been a developing picture of a uterine fibroid which appears to be encroaching on the endometrial cavity and the continued irregular bleeding despite changing HRT regimens.  Stopped HRT 09/15/14 in order to retest hormones. Had [facility] and estradiol drawn 10/05/14 - 115. Had hot flashes both on and off HRT.  Pelvic ultrasound 09/15/14 - Uterus with 2 fibroids - 3.28 and 1.4 cm. The larger fibroid appears to be encroaching on the endometrium. EMS 3.71 mm. Ovaries normal.  No free fluid.   EMB 10/05/14 - weakly proliferative endometrium with no hyperplasia or malignancy.  Had bleeding around 10/12/14.  History of one Cesarean Section for failure to progress. Status post bilateral tubal ligation.   Family history of maternal grandmother with uterine cancer.  Maternal great aunt and with colon cancer.  No family history of breast or ovarian cancer.   Has some difficulty to get to bathroom on time.  Can leak with sneeze.  No leak with cough, laugh.  GYNECOLOGIC HISTORY: Patient's last menstrual period was 10/15/2014. Contraception: Tubal Ligation Menopausal hormone therapy: n/a Last mammogram: 07/11/14 bi-rads 1: negative Last pap smear: 12/20/14 wnl neg hr hpv         OB History    Gravida Para Term Preterm AB TAB SAB Ectopic Multiple Living   1 1 1       1          Patient Active Problem List   Diagnosis Date Noted  . Fatigue 05/03/2014  . GERD (gastroesophageal reflux disease) 05/03/2014  . Uterine fibroid 10/07/2013  . Vasomotor flushing 09/21/2013  . Hair loss 09/21/2013  . 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 Abnormal uterine bleeding (AUB) 09/21/2013    Past Medical History  Diagnosis Date  . Fibroid   . Dyspareunia   . Cancer 03/2013, 06/2013    malignant melanoma on right arm and back  . 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 Tubal ligation    . Melanoma excision Right 03/2013    melanoma right arm  . Skin cancer excision  06/2013    midddle of back with path report of atypia  . Cryotherapy  5/91    for persistent condyloma atypia  . Laser ablation of condylomas  03/05/90 Dr. Warnell Forester    perineal area- also had Bowens disease  . Breast surgery Right 04/2010    breast biopsy for multiple fibroadenoma - no atypia  . Breast biopsy  1987    Dr. Rosana Hoes  . Cesarean section    . Tubal ligation Bilateral     Current Outpatient Prescriptions  Medication Sig Dispense Refill  . Cholecalciferol (D3 HIGH  POTENCY) 1000 UNITS capsule Take 1,000 Units by mouth daily.    Marland Kitchen estradiol (ESTRACE) 1 MG tablet Take 1 tablet (1 mg total) by mouth daily. 90 tablet 0  . ibuprofen (ADVIL,MOTRIN) 200 MG tablet Take 400 mg by mouth every 6 (six) hours as needed.    . medroxyPROGESTERone (PROVERA) 10 MG tablet Take 10 mg by mouth daily.    . Omega-3 Fatty Acids (FISH OIL PO) Take 645 mg by mouth daily.     . pantoprazole (PROTONIX) 40 MG tablet Take 1 tablet (40 mg total) by mouth daily. (Patient taking differently: Take 40 mg by mouth daily as needed. ) 30 tablet 3   No current facility-administered medications for this visit.     ALLERGIES: Tetracyclines & related; Asa;  and Sulfa antibiotics  Family History  Problem Relation Age of Onset  . Cancer Mother     lung  . Alcohol abuse Father     Cirrhosis of liver  . Cancer Maternal Aunt     stomach cancer  . Cancer Maternal Grandmother     uterine cancer    History   Social History  . Marital Status: Married    Spouse Name: N/A  . Number of Children: 1  . Years of Education: N/A   Occupational History  .     Social History Main Topics  . Smoking status: Never Smoker   . Smokeless tobacco: Never Used  . Alcohol Use: No  . Drug Use: No  . Sexual Activity:    Partners: Male    Birth Control/ Protection: Surgical     Comment: Tubal   Other Topics Concern  . Not on file   Social History Narrative    ROS:  Pertinent items are noted in HPI.  PHYSICAL EXAMINATION:    BP 126/82 mmHg  Pulse 76  Ht 5\' 5"  (1.651 m)  Wt 197 lb 3.2 oz (89.449 kg)  BMI 32.82 kg/m2  LMP 10/15/2014    General appearance: alert, cooperative and appears stated age Head: Normocephalic, without obvious abnormality, atraumatic Neck: no adenopathy, supple, symmetrical, trachea midline and thyroid normal to inspection and palpation Lungs: clear to auscultation bilaterally Heart: regular rate and rhythm Abdomen: Pfannenstiel incision, soft, non-tender;  no masses,  no organomegaly Extremities: extremities normal, atraumatic, no cyanosis or edema Skin: Skin color, texture, turgor normal. No rashes or lesions Lymph nodes: Cervical, supraclavicular, and axillary nodes normal. No abnormal inguinal nodes palpated Neurologic: Grossly normal  Pelvic: External genitalia:  no lesions              Urethra:  normal appearing urethra with no masses, tenderness or lesions              Bartholins and Skenes: normal                 Vagina: normal appearing vagina with normal color and discharge, no lesions.  Good uterovaginal support.               Cervix: no lesions            Bimanual Exam:  Uterus:  normal size, contour,  position, consistency, mobility, non-tender              Adnexa: normal adnexa and no mass, fullness, tenderness               Chaperone was present for exam.  ASSESSMENT  Postmenopausal bleeding on HRT regimens.  Uterine fibroids.  Premature menopause.  Hx Cesarean  section an BTL.  Mild inconsistent stress incontinence.   PLAN  Discussed Davinci robotic total laparoscopic hysterectomy with bilateral salpingo-oophorectomy and cystoscopy.. Risks, benefits, and alternatives discussed with the patient who wishes to proceed.  Risks include but are not limited to bleeding, infection, damage to surrounding organs, reaction to anesthesia, pneumonia, DVT, PE, death, hernia formation, and need for reoperation.  Surgical expectations and recovery discussed. Patient wishes to proceed.  I do not recommend a midurethral sling at this time.  Will stop HRT 2 weeks prior to surgery. Will restart after initial surgery recovery.  Magnesium citrate bowel prep.   An After Visit Summary was printed and given to the patient.  ____25__ minutes face to face time of which over 50% was spent in counseling.

## 2015-02-06 ENCOUNTER — Inpatient Hospital Stay (HOSPITAL_COMMUNITY): Admission: RE | Admit: 2015-02-06 | Payer: Self-pay | Source: Ambulatory Visit

## 2015-02-08 ENCOUNTER — Encounter (HOSPITAL_COMMUNITY)
Admission: RE | Admit: 2015-02-08 | Discharge: 2015-02-08 | Disposition: A | Discharge status: Home / Self Care | Payer: BLUE CROSS/BLUE SHIELD | Source: Ambulatory Visit | Attending: Obstetrics and Gynecology | Admitting: Obstetrics and Gynecology

## 2015-02-08 ENCOUNTER — Encounter (HOSPITAL_COMMUNITY): Payer: Self-pay

## 2015-02-08 DIAGNOSIS — Z01818 Encounter for other preprocedural examination: Secondary | ICD-10-CM | POA: Diagnosis present

## 2015-02-08 HISTORY — DX: Other specified abnormal findings of blood chemistry: R79.89

## 2015-02-08 HISTORY — DX: Unspecified osteoarthritis, unspecified site: M19.90

## 2015-02-08 HISTORY — DX: Gastro-esophageal reflux disease without esophagitis: K21.9

## 2015-02-08 LAB — BASIC METABOLIC PANEL
Anion gap: 5 (ref 5–15)
BUN: 16 mg/dL (ref 6–20)
CO2: 26 mmol/L (ref 22–32)
Calcium: 9.1 mg/dL (ref 8.9–10.3)
Chloride: 107 mmol/L (ref 101–111)
Creatinine, Ser: 1.04 mg/dL — ABNORMAL HIGH (ref 0.44–1.00)
GFR calc Af Amer: >60 mL/min (ref >60)
GFR calc non Af Amer: >60 mL/min (ref >60)
Glucose, Bld: 105 mg/dL — ABNORMAL HIGH (ref 65–99)
Potassium: 4.3 mmol/L (ref 3.5–5.1)
Sodium: 138 mmol/L (ref 135–145)

## 2015-02-08 LAB — CBC
HCT: 41 % (ref 36.0–46.0)
Hemoglobin: 13.1 g/dL (ref 12.0–15.0)
MCH: 27.6 pg (ref 26.0–34.0)
MCHC: 32 g/dL (ref 30.0–36.0)
MCV: 86.3 fL (ref 78.0–100.0)
Platelets: 223 10*3/uL (ref 150–400)
RBC: 4.75 MIL/uL (ref 3.87–5.11)
RDW: 13.7 % (ref 11.5–15.5)
WBC: 4.7 10*3/uL (ref 4.0–10.5)

## 2015-02-08 LAB — TYPE AND SCREEN
ABO/RH(D): B POS
Antibody Screen: NEGATIVE

## 2015-02-08 LAB — ABO/RH: ABO/RH(D): B POS

## 2015-02-08 NOTE — Patient Instructions (Signed)
Your procedure is scheduled on:  February 14, 2015  Enter through the Main Entrance of [facility] at:   0600 am   Pick up the phone at the desk and dial [phone].  Call this number if you have problems the morning of surgery: [phone].  Remember: Do NOT eat food:  After midnight on Monday  Do NOT drink clear liquids after:  Midnight on Monday  Take these medicines the morning of surgery with a SIP OF WATER:  None   Do NOT wear jewelry (body piercing), metal hair clips/bobby pins, make-up, or nail polish. Do NOT wear lotions, powders, or perfumes.  You may wear deoderant. Do NOT shave for 48 hours prior to surgery. Do NOT bring valuables to the hospital. Contacts, dentures, or bridgework may not be worn into surgery. Leave suitcase in car.  After surgery it may be brought to your room.  For patients admitted to the hospital, checkout time is 11:00 AM the day of discharge.

## 2015-02-13 MED ORDER — DEXTROSE 5 % IV SOLN
2.0000 g | INTRAVENOUS | Status: AC
  Administered 2015-02-14: 2 g via INTRAVENOUS
  Filled 2015-02-13: qty 2

## 2015-02-13 NOTE — H&P (Signed)
Progress Notes   Alexis Young (MR# [id number])      Progress Notes Info    Author Note Status Last Update User Last Update Date/Time   Nunzio Cobbs, MD Signed Nunzio Cobbs, MD 01/26/2015 7:28 PM    Progress Notes    Expand All Collapse All   GYNECOLOGY VISIT  HPI: 48 y.o. Married Caucasian female  G1P1001 with Patient's last menstrual period was 10/15/2014.  here for surgery consult Bleeding on different HRT regimens but none in the last three months. Occasional cramping.  Desires hysterectomy with removal of tubes and ovaries.   Patient has diagnosis of premature menopause since age 67 and has been on various regimens of HRT.  Most recently was switched from Estradiol 1 mg daily and Provera 5 mg daily to Estradiol 1 mg daily and Provera 10 mg daily to try to regulate and cease vaginal bleeding. More recent bleeding pattern has been: Bled from 06/20/14 - 09/07/14.  Has had evaluations for her bleeding during the last one year.  The focus has been a developing picture of a uterine fibroid which appears to be encroaching on the endometrial cavity and the continued irregular bleeding despite changing HRT regimens.  Stopped HRT 09/15/14 in order to retest hormones. Had [facility] and estradiol drawn 10/05/14 - 115. Had hot flashes both on and off HRT.  Pelvic ultrasound 09/15/14 - Uterus with 2 fibroids - 3.28 and 1.4 cm. The larger fibroid appears to be encroaching on the endometrium. EMS 3.71 mm. Ovaries normal.  No free fluid.   EMB 10/05/14 - weakly proliferative endometrium with no hyperplasia or malignancy.  Had bleeding around 10/12/14.  History of one Cesarean Section for failure to progress. Status post bilateral tubal ligation.   Family history of maternal grandmother with uterine cancer.  Maternal great aunt and with colon cancer.  No family history of breast or ovarian cancer.   Has some difficulty to get to bathroom on  time.  Can leak with sneeze.  No leak with cough, laugh.  GYNECOLOGIC HISTORY: Patient's last menstrual period was 10/15/2014. Contraception: Tubal Ligation Menopausal hormone therapy: n/a Last mammogram: 07/11/14 bi-rads 1: negative Last pap smear: 12/20/14 wnl neg hr hpv   OB History    Gravida Para Term Preterm AB TAB SAB Ectopic Multiple Living   1 1 1       1        Patient Active Problem List   Diagnosis Date Noted  . Fatigue 05/03/2014  . GERD (gastroesophageal reflux disease) 05/03/2014  . Uterine fibroid 10/07/2013  . Vasomotor flushing 09/21/2013  . Hair loss 09/21/2013  . 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 Abnormal uterine bleeding (AUB) 09/21/2013    Past Medical History  Diagnosis Date  . Fibroid   . Dyspareunia   . Cancer 03/2013, 06/2013    malignant melanoma on right arm and back  . 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 Tubal ligation    . Melanoma excision Right 03/2013    melanoma right arm  . Skin cancer excision  06/2013    midddle of back with path report of atypia  . Cryotherapy  5/91    for persistent condyloma atypia  . Laser ablation of condylomas  03/05/90 Dr. Warnell Forester    perineal area- also had Bowens disease  . Breast surgery Right 04/2010    breast biopsy  for multiple fibroadenoma - no atypia  . Breast biopsy  1987    Dr. Rosana Hoes  . Cesarean section    . Tubal ligation Bilateral     Current Outpatient Prescriptions  Medication Sig Dispense Refill  . Cholecalciferol (D3 HIGH POTENCY) 1000 UNITS capsule Take 1,000 Units by mouth daily.    Marland Kitchen estradiol (ESTRACE) 1 MG tablet Take 1 tablet (1 mg total) by mouth daily. 90 tablet 0  . ibuprofen (ADVIL,MOTRIN) 200 MG tablet Take 400 mg by mouth every 6  (six) hours as needed.    . medroxyPROGESTERone (PROVERA) 10 MG tablet Take 10 mg by mouth daily.    . Omega-3 Fatty Acids (FISH OIL PO) Take 645 mg by mouth daily.     . pantoprazole (PROTONIX) 40 MG tablet Take 1 tablet (40 mg total) by mouth daily. (Patient taking differently: Take 40 mg by mouth daily as needed. ) 30 tablet 3   No current facility-administered medications for this visit.     ALLERGIES: Tetracyclines & related; Asa; and Sulfa antibiotics  Family History  Problem Relation Age of Onset  . Cancer Mother     lung  . Alcohol abuse Father     Cirrhosis of liver  . Cancer Maternal Aunt     stomach cancer  . Cancer Maternal Grandmother     uterine cancer    History   Social History  . Marital Status: Married    Spouse Name: N/A  . Number of Children: 1  . Years of Education: N/A   Occupational History  .     Social History Main Topics  . Smoking status: Never Smoker   . Smokeless tobacco: Never Used  . Alcohol Use: No  . Drug Use: No  . Sexual Activity:    Partners: Male    Birth Control/ Protection: Surgical     Comment: Tubal   Other Topics Concern  . Not on file   Social History Narrative    ROS: Pertinent items are noted in HPI.  PHYSICAL EXAMINATION:   BP 126/82 mmHg  Pulse 76  Ht 5\' 5"  (1.651 m)  Wt 197 lb 3.2 oz (89.449 kg)  BMI 32.82 kg/m2  LMP 10/15/2014  General appearance: alert, cooperative and appears stated age Head: Normocephalic, without obvious abnormality, atraumatic Neck: no adenopathy, supple, symmetrical, trachea midline and thyroid normal to inspection and palpation Lungs: clear to auscultation bilaterally Heart: regular rate and rhythm Abdomen: Pfannenstiel incision, soft, non-tender; no masses, no organomegaly Extremities: extremities normal, atraumatic, no cyanosis or edema Skin: Skin color,  texture, turgor normal. No rashes or lesions Lymph nodes: Cervical, supraclavicular, and axillary nodes normal. No abnormal inguinal nodes palpated Neurologic: Grossly normal  Pelvic: External genitalia: no lesions  Urethra: normal appearing urethra with no masses, tenderness or lesions  Bartholins and Skenes: normal   Vagina: normal appearing vagina with normal color and discharge, no lesions. Good uterovaginal support.   Cervix: no lesions   Bimanual Exam: Uterus: normal size, contour, position, consistency, mobility, non-tender  Adnexa: normal adnexa and no mass, fullness, tenderness   Chaperone was present for exam.  ASSESSMENT  Postmenopausal bleeding on HRT regimens.  Uterine fibroids.  Premature menopause.  Hx Cesarean section an BTL.  Mild inconsistent stress incontinence.   PLAN  Discussed Davinci robotic total laparoscopic hysterectomy with bilateral salpingo-oophorectomy and cystoscopy.. Risks, benefits, and alternatives discussed with the patient who wishes to proceed.  Risks include but are not limited to bleeding, infection, damage to surrounding organs,  reaction to anesthesia, pneumonia, DVT, PE, death, hernia formation, and need for reoperation.  Surgical expectations and recovery discussed. Patient wishes to proceed.  I do not recommend a midurethral sling at this time.  Will stop HRT 2 weeks prior to surgery. Will restart after initial surgery recovery.  Magnesium citrate bowel prep.   An After Visit Summary was printed and given to the patient.  ____25__ minutes face to face time of which over 50% was spent in counseling.

## 2015-02-14 ENCOUNTER — Encounter (HOSPITAL_COMMUNITY): Payer: Self-pay | Admitting: Anesthesiology

## 2015-02-14 ENCOUNTER — Ambulatory Visit (HOSPITAL_COMMUNITY): Payer: BLUE CROSS/BLUE SHIELD | Admitting: Anesthesiology

## 2015-02-14 ENCOUNTER — Encounter (HOSPITAL_COMMUNITY)
Admission: RE | Disposition: A | Discharge status: Home / Self Care | Payer: Self-pay | Source: Ambulatory Visit | Attending: Obstetrics and Gynecology

## 2015-02-14 ENCOUNTER — Ambulatory Visit (HOSPITAL_COMMUNITY)
Admission: RE | Admit: 2015-02-14 | Discharge: 2015-02-15 | Disposition: A | Discharge status: Home / Self Care | Payer: BLUE CROSS/BLUE SHIELD | Source: Ambulatory Visit | Attending: Obstetrics and Gynecology | Admitting: Obstetrics and Gynecology

## 2015-02-14 DIAGNOSIS — N950 Postmenopausal bleeding: Secondary | ICD-10-CM | POA: Diagnosis not present

## 2015-02-14 DIAGNOSIS — Z90710 Acquired absence of both cervix and uterus: Secondary | ICD-10-CM | POA: Diagnosis present

## 2015-02-14 DIAGNOSIS — K219 Gastro-esophageal reflux disease without esophagitis: Secondary | ICD-10-CM | POA: Diagnosis not present

## 2015-02-14 DIAGNOSIS — N888 Other specified noninflammatory disorders of cervix uteri: Secondary | ICD-10-CM | POA: Insufficient documentation

## 2015-02-14 DIAGNOSIS — D259 Leiomyoma of uterus, unspecified: Secondary | ICD-10-CM | POA: Diagnosis not present

## 2015-02-14 DIAGNOSIS — Z8049 Family history of malignant neoplasm of other genital organs: Secondary | ICD-10-CM | POA: Diagnosis not present

## 2015-02-14 HISTORY — PX: BILATERAL SALPINGECTOMY: SHX5743

## 2015-02-14 HISTORY — PX: CYSTOSCOPY: SHX5120

## 2015-02-14 HISTORY — PX: ROBOTIC ASSISTED TOTAL HYSTERECTOMY: SHX6085

## 2015-02-14 LAB — CBC
HCT: 40 % (ref 36.0–46.0)
Hemoglobin: 13.1 g/dL (ref 12.0–15.0)
MCH: 28.2 pg (ref 26.0–34.0)
MCHC: 32.8 g/dL (ref 30.0–36.0)
MCV: 86.2 fL (ref 78.0–100.0)
Platelets: 202 10*3/uL (ref 150–400)
RBC: 4.64 MIL/uL (ref 3.87–5.11)
RDW: 13.6 % (ref 11.5–15.5)
WBC: 10.8 10*3/uL — ABNORMAL HIGH (ref 4.0–10.5)

## 2015-02-14 SURGERY — ROBOTIC ASSISTED TOTAL HYSTERECTOMY
Anesthesia: General | Site: Urethra

## 2015-02-14 MED ORDER — SCOPOLAMINE 1 MG/3DAYS TD PT72
1.0000 | MEDICATED_PATCH | Freq: Once | TRANSDERMAL | Status: DC
  Administered 2015-02-14: 1.5 mg via TRANSDERMAL

## 2015-02-14 MED ORDER — HYDROMORPHONE HCL 1 MG/ML IJ SOLN
INTRAMUSCULAR | Status: AC
  Administered 2015-02-14: 0.25 mg via INTRAVENOUS
  Filled 2015-02-14: qty 1

## 2015-02-14 MED ORDER — HYDROMORPHONE HCL 1 MG/ML IJ SOLN
INTRAMUSCULAR | Status: DC | PRN
  Administered 2015-02-14: 1 mg via INTRAVENOUS

## 2015-02-14 MED ORDER — SODIUM CHLORIDE 0.9 % IJ SOLN
INTRAMUSCULAR | Status: AC
  Filled 2015-02-14: qty 50

## 2015-02-14 MED ORDER — ACETAMINOPHEN 325 MG PO TABS: 325.0000 mg | ORAL_TABLET | ORAL | Status: DC | PRN

## 2015-02-14 MED ORDER — MIDAZOLAM HCL 5 MG/5ML IJ SOLN
INTRAMUSCULAR | Status: DC | PRN
  Administered 2015-02-14: 2 mg via INTRAVENOUS

## 2015-02-14 MED ORDER — OXYCODONE-ACETAMINOPHEN 5-325 MG PO TABS
1.0000 | ORAL_TABLET | ORAL | Status: DC | PRN
  Administered 2015-02-14: 1 via ORAL
  Administered 2015-02-14: 2 via ORAL
  Administered 2015-02-14: 1 via ORAL
  Administered 2015-02-15: 2 via ORAL
  Filled 2015-02-14 (×2): qty 2
  Filled 2015-02-14: qty 1
  Filled 2015-02-14: qty 2

## 2015-02-14 MED ORDER — PROPOFOL 10 MG/ML IV BOLUS
INTRAVENOUS | Status: DC | PRN
  Administered 2015-02-14: 50 mg via INTRAVENOUS
  Administered 2015-02-14: 150 mg via INTRAVENOUS

## 2015-02-14 MED ORDER — SODIUM CHLORIDE 0.9 % IV SOLN
INTRAVENOUS | Status: DC | PRN
  Administered 2015-02-14: 60 mL

## 2015-02-14 MED ORDER — ONDANSETRON HCL 4 MG/2ML IJ SOLN
4.0000 mg | Freq: Four times a day (QID) | INTRAMUSCULAR | Status: DC | PRN
  Administered 2015-02-14: 4 mg via INTRAVENOUS
  Filled 2015-02-14: qty 2

## 2015-02-14 MED ORDER — MEPERIDINE HCL 25 MG/ML IJ SOLN: 6.2500 mg | INTRAMUSCULAR | Status: DC | PRN

## 2015-02-14 MED ORDER — ACETAMINOPHEN 10 MG/ML IV SOLN
1000.0000 mg | Freq: Once | INTRAVENOUS | Status: AC
  Administered 2015-02-14: 1000 mg via INTRAVENOUS
  Filled 2015-02-14: qty 100

## 2015-02-14 MED ORDER — MENTHOL 3 MG MT LOZG: 1.0000 | LOZENGE | OROMUCOSAL | Status: DC | PRN

## 2015-02-14 MED ORDER — PANTOPRAZOLE SODIUM 40 MG PO TBEC: 40.0000 mg | DELAYED_RELEASE_TABLET | Freq: Every day | ORAL | Status: DC | PRN

## 2015-02-14 MED ORDER — BUPIVACAINE HCL (PF) 0.25 % IJ SOLN
INTRAMUSCULAR | Status: DC | PRN
  Administered 2015-02-14: 11 mL

## 2015-02-14 MED ORDER — ONDANSETRON HCL 4 MG PO TABS: 4.0000 mg | ORAL_TABLET | Freq: Four times a day (QID) | ORAL | Status: DC | PRN

## 2015-02-14 MED ORDER — STERILE WATER FOR IRRIGATION IR SOLN
Status: DC | PRN
  Administered 2015-02-14: 3000 mL

## 2015-02-14 MED ORDER — LACTATED RINGERS IR SOLN
Status: DC | PRN
  Administered 2015-02-14: 3000 mL

## 2015-02-14 MED ORDER — PROMETHAZINE HCL 25 MG/ML IJ SOLN: 6.2500 mg | INTRAMUSCULAR | Status: DC | PRN

## 2015-02-14 MED ORDER — FENTANYL CITRATE (PF) 100 MCG/2ML IJ SOLN
INTRAMUSCULAR | Status: DC | PRN
  Administered 2015-02-14 (×3): 50 ug via INTRAVENOUS
  Administered 2015-02-14: 100 ug via INTRAVENOUS

## 2015-02-14 MED ORDER — BUPIVACAINE HCL (PF) 0.25 % IJ SOLN
INTRAMUSCULAR | Status: AC
  Filled 2015-02-14: qty 30

## 2015-02-14 MED ORDER — LACTATED RINGERS IV SOLN
INTRAVENOUS | Status: DC
  Administered 2015-02-14: 07:00:00 via INTRAVENOUS

## 2015-02-14 MED ORDER — DEXAMETHASONE SODIUM PHOSPHATE 4 MG/ML IJ SOLN
INTRAMUSCULAR | Status: DC | PRN
  Administered 2015-02-14: 4 mg via INTRAVENOUS

## 2015-02-14 MED ORDER — LIDOCAINE HCL (CARDIAC) 20 MG/ML IV SOLN
INTRAVENOUS | Status: DC | PRN
  Administered 2015-02-14 (×2): 50 mg via INTRAVENOUS

## 2015-02-14 MED ORDER — ONDANSETRON HCL 4 MG/2ML IJ SOLN
INTRAMUSCULAR | Status: DC | PRN
  Administered 2015-02-14: 4 mg via INTRAVENOUS

## 2015-02-14 MED ORDER — ROPIVACAINE HCL 5 MG/ML IJ SOLN
INTRAMUSCULAR | Status: AC
  Filled 2015-02-14: qty 60

## 2015-02-14 MED ORDER — MORPHINE SULFATE 4 MG/ML IJ SOLN
2.0000 mg | INTRAMUSCULAR | Status: DC | PRN
  Administered 2015-02-14: 2 mg via INTRAVENOUS
  Filled 2015-02-14: qty 1

## 2015-02-14 MED ORDER — SCOPOLAMINE 1 MG/3DAYS TD PT72
MEDICATED_PATCH | TRANSDERMAL | Status: AC
  Administered 2015-02-14: 1.5 mg via TRANSDERMAL
  Filled 2015-02-14: qty 1

## 2015-02-14 MED ORDER — HYDROMORPHONE HCL 1 MG/ML IJ SOLN
0.2500 mg | INTRAMUSCULAR | Status: DC | PRN
  Administered 2015-02-14 (×4): 0.25 mg via INTRAVENOUS

## 2015-02-14 MED ORDER — LACTATED RINGERS IV SOLN
INTRAVENOUS | Status: DC
  Administered 2015-02-14: 15:00:00 via INTRAVENOUS

## 2015-02-14 MED ORDER — ACETAMINOPHEN 160 MG/5ML PO SOLN: 325.0000 mg | ORAL | Status: DC | PRN

## 2015-02-14 MED ORDER — ROCURONIUM BROMIDE 100 MG/10ML IV SOLN
INTRAVENOUS | Status: DC | PRN
  Administered 2015-02-14: 5 mg via INTRAVENOUS
  Administered 2015-02-14: 40 mg via INTRAVENOUS
  Administered 2015-02-14: 5 mg via INTRAVENOUS
  Administered 2015-02-14 (×3): 10 mg via INTRAVENOUS

## 2015-02-14 MED ORDER — GLYCOPYRROLATE 0.2 MG/ML IJ SOLN
INTRAMUSCULAR | Status: DC | PRN
  Administered 2015-02-14: 0.2 mg via INTRAVENOUS
  Administered 2015-02-14: .6 mg via INTRAVENOUS
  Administered 2015-02-14: 0.4 mg via INTRAVENOUS

## 2015-02-14 MED ORDER — NEOSTIGMINE METHYLSULFATE 10 MG/10ML IV SOLN
INTRAVENOUS | Status: DC | PRN
  Administered 2015-02-14: 3.5 mg via INTRAVENOUS
  Administered 2015-02-14: 1.5 mg via INTRAVENOUS

## 2015-02-14 SURGICAL SUPPLY — 54 items
BARRIER ADHS 3X4 INTERCEED (GAUZE/BANDAGES/DRESSINGS) IMPLANT
BRR ADH 4X3 ABS CNTRL BYND (GAUZE/BANDAGES/DRESSINGS)
CATH FOLEY 3WAY  5CC 16FR (CATHETERS) ×1
CATH FOLEY 3WAY 5CC 16FR (CATHETERS) ×4 IMPLANT
CLOTH BEACON ORANGE TIMEOUT ST (SAFETY) ×5 IMPLANT
CONT PATH 16OZ SNAP LID 3702 (MISCELLANEOUS) ×10 IMPLANT
COVER BACK TABLE 60X90IN (DRAPES) ×10 IMPLANT
COVER TIP SHEARS 8 DVNC (MISCELLANEOUS) ×8 IMPLANT
COVER TIP SHEARS 8MM DA VINCI (MISCELLANEOUS) ×2
DECANTER SPIKE VIAL GLASS SM (MISCELLANEOUS) ×12 IMPLANT
DRAPE ROBOTICS STRL (DRAPES) IMPLANT
DRAPE WARM FLUID 44X44 (DRAPE) ×5 IMPLANT
DRSG OPSITE POSTOP 3X4 (GAUZE/BANDAGES/DRESSINGS) ×2 IMPLANT
DURAPREP 26ML APPLICATOR (WOUND CARE) ×5 IMPLANT
ELECT REM PT RETURN 9FT ADLT (ELECTROSURGICAL) ×5
ELECTRODE REM PT RTRN 9FT ADLT (ELECTROSURGICAL) ×4 IMPLANT
GAUZE VASELINE 3X9 (GAUZE/BANDAGES/DRESSINGS) IMPLANT
GLOVE BIO SURGEON STRL SZ 6.5 (GLOVE) ×15 IMPLANT
GLOVE BIOGEL PI IND STRL 6.5 (GLOVE) ×4 IMPLANT
GLOVE BIOGEL PI IND STRL 7.0 (GLOVE) ×8 IMPLANT
GLOVE BIOGEL PI INDICATOR 6.5 (GLOVE) ×1
GLOVE BIOGEL PI INDICATOR 7.0 (GLOVE) ×2
KIT ACCESSORY DA VINCI DISP (KITS) ×1
KIT ACCESSORY DVNC DISP (KITS) ×4 IMPLANT
LEGGING LITHOTOMY PAIR STRL (DRAPES) ×5 IMPLANT
LIQUID BAND (GAUZE/BANDAGES/DRESSINGS) ×5 IMPLANT
NEEDLE INSUFFLATION 120MM (ENDOMECHANICALS) ×2 IMPLANT
OCCLUDER COLPOPNEUMO (BALLOONS) ×2 IMPLANT
PACK ROBOT WH (CUSTOM PROCEDURE TRAY) ×5 IMPLANT
PACK ROBOTIC GOWN (GOWN DISPOSABLE) ×5 IMPLANT
PAD POSITIONER PINK NONSTERILE (MISCELLANEOUS) ×5 IMPLANT
PAD PREP 24X48 CUFFED NSTRL (MISCELLANEOUS) ×10 IMPLANT
SET BI-LUMEN FLTR TB AIRSEAL (TUBING) ×5 IMPLANT
SET CYSTO W/LG BORE CLAMP LF (SET/KITS/TRAYS/PACK) ×4 IMPLANT
SET IRRIG TUBING LAPAROSCOPIC (IRRIGATION / IRRIGATOR) ×8 IMPLANT
SUT VIC AB 0 CT2 27 (SUTURE) ×25 IMPLANT
SUT VIC AB 4-0 PS2 27 (SUTURE) ×10 IMPLANT
SUT VICRYL 0 UR6 27IN ABS (SUTURE) ×10 IMPLANT
SUT VLOC 180 0 9IN  GS21 (SUTURE) ×9
SUT VLOC 180 0 9IN GS21 (SUTURE) ×9 IMPLANT
SYR 50ML LL SCALE MARK (SYRINGE) ×10 IMPLANT
SYSTEM CONVERTIBLE TROCAR (TROCAR) ×2 IMPLANT
TIP RUMI ORANGE 6.7MMX12CM (TIP) IMPLANT
TIP UTERINE 5.1X6CM LAV DISP (MISCELLANEOUS) IMPLANT
TIP UTERINE 6.7X10CM GRN DISP (MISCELLANEOUS) IMPLANT
TIP UTERINE 6.7X6CM WHT DISP (MISCELLANEOUS) IMPLANT
TIP UTERINE 6.7X8CM BLUE DISP (MISCELLANEOUS) ×3 IMPLANT
TOWEL OR 17X24 6PK STRL BLUE (TOWEL DISPOSABLE) ×15 IMPLANT
TROCAR DILATING TIP 12MM 150MM (ENDOMECHANICALS) ×5 IMPLANT
TROCAR DISP BLADELESS 8 DVNC (TROCAR) ×4 IMPLANT
TROCAR DISP BLADELESS 8MM (TROCAR) ×1
TUBING INSUFFLATION W/FILTER (TUBING) IMPLANT
WARMER LAPAROSCOPE (MISCELLANEOUS) ×5 IMPLANT
WATER STERILE IRR 1000ML POUR (IV SOLUTION) ×15 IMPLANT

## 2015-02-14 NOTE — Brief Op Note (Signed)
02/14/2015  10:10 AM  PATIENT:  Alexis Young  49 y.o. female  PRE-OPERATIVE DIAGNOSIS:  PMB, uterine fibroids  POST-OPERATIVE DIAGNOSIS:  PUB, uterine fibroids  PROCEDURE:  Procedure(s): ROBOTIC ASSISTED TOTAL HYSTERECTOMY (N/A) BILATERAL SALPINGECTOMY OOPHORECTOMY (Bilateral) CYSTOSCOPY (N/A)  SURGEON:  Surgeon(s) and Role:    * Brook E Yisroel Ramming, MD - Primary    * Megan Salon, MD - Assisting    * Sumner Boast, MD - Assisting  PHYSICIAN ASSISTANT: NA  ASSISTANTS:   Megan Salon, MD - Assisting                           Sumner Boast, MD - Assisting   ANESTHESIA:   local, general and  IV tylenol 1 gram, Intraperitoneal Ropivicaine 30 cc in 30 cc or normal saline  EBL:  Total I/O In: 700 [I.V.:700] Out: 250 [Urine:200; Blood:50]  BLOOD ADMINISTERED:none  DRAINS: Urinary Catheter (Foley)   LOCAL MEDICATIONS USED:  MARCAINE     SPECIMEN:  Source of Specimen:  Uterus, cervix, bilateral tubes and ovaries  DISPOSITION OF SPECIMEN:  PATHOLOGY  COUNTS:  YES  TOURNIQUET:  * No tourniquets in log *  DICTATION: .Other Dictation: Dictation Number    PLAN OF CARE: Extended recovery  PATIENT DISPOSITION:  PACU - hemodynamically stable.   Delay start of Pharmacological VTE agent (>24hrs) due to surgical blood loss or risk of bleeding: not applicable

## 2015-02-14 NOTE — Transfer of Care (Signed)
Immediate Anesthesia Transfer of Care Note  Patient: Alexis Young  Procedure(s) Performed: Procedure(s): ROBOTIC ASSISTED TOTAL HYSTERECTOMY (N/A) BILATERAL SALPINGECTOMY OOPHORECTOMY (Bilateral) CYSTOSCOPY (N/A)  Patient Location: PACU  Anesthesia Type:General  Level of Consciousness: awake, alert  and oriented  Airway & Oxygen Therapy: Patient Spontanous Breathing and Patient connected to nasal cannula oxygen  Post-op Assessment: Report given to RN and Post -op Vital signs reviewed and stable  Post vital signs: Reviewed and stable  Last Vitals:  Filed Vitals:   02/14/15 1015  BP: 146/67  Pulse: 67  Temp:   Resp: 12    Complications: No apparent anesthesia complications

## 2015-02-14 NOTE — Anesthesia Preprocedure Evaluation (Signed)
Anesthesia Evaluation  Patient identified by MRN, date of birth, ID band Patient awake    Reviewed: Allergy & Precautions, H&P , NPO status , Patient's Chart, lab work & pertinent test results  Airway Mallampati: I  TM Distance: >3 FB Neck ROM: full    Dental no notable dental hx. (+) Teeth Intact   Pulmonary neg pulmonary ROS,    Pulmonary exam normal       Cardiovascular Normal cardiovascular exam    Neuro/Psych negative neurological ROS  negative psych ROS   GI/Hepatic negative GI ROS, Neg liver ROS, GERD-  Medicated and Controlled,  Endo/Other  negative endocrine ROS  Renal/GU negative Renal ROS     Musculoskeletal   Abdominal Normal abdominal exam  (+)   Peds  Hematology negative hematology ROS (+)   Anesthesia Other Findings   Reproductive/Obstetrics negative OB ROS                             Anesthesia Physical Anesthesia Plan  ASA: II  Anesthesia Plan: General   Post-op Pain Management:    Induction: Intravenous  Airway Management Planned: Oral ETT  Additional Equipment:   Intra-op Plan:   Post-operative Plan: Extubation in OR  Informed Consent: I have reviewed the patients History and Physical, chart, labs and discussed the procedure including the risks, benefits and alternatives for the proposed anesthesia with the patient or authorized representative who has indicated his/her understanding and acceptance.   Dental Advisory Given  Plan Discussed with: CRNA and Surgeon  Anesthesia Plan Comments:         Anesthesia Quick Evaluation

## 2015-02-14 NOTE — Anesthesia Postprocedure Evaluation (Signed)
Anesthesia Post Note  Patient: Alexis Young  Procedure(s) Performed: Procedure(s) (LRB): ROBOTIC ASSISTED TOTAL HYSTERECTOMY (N/A) BILATERAL SALPINGECTOMY OOPHORECTOMY (Bilateral) CYSTOSCOPY (N/A)  Anesthesia type: General  Patient location: PACU  Post pain: Pain level controlled  Post assessment: Post-op Vital signs reviewed  Last Vitals:  Filed Vitals:   02/14/15 1100  BP: 148/75  Pulse: 56  Temp:   Resp: 15    Post vital signs: Reviewed  Level of consciousness: sedated  Complications: No apparent anesthesia complications

## 2015-02-14 NOTE — Op Note (Signed)
NAMEMISAKI, SOZIO             ACCOUNT NO.:  [id number]  MEDICAL RECORD NO.:  [id number]  LOCATION:  [facility]                          FACILITY:  [facility]  PHYSICIAN:  Lenard Galloway, M.D.   DATE OF BIRTH:  1966-01-09  DATE OF PROCEDURE:  02/14/2015 DATE OF DISCHARGE:                              OPERATIVE REPORT   PREOPERATIVE DIAGNOSIS:  Recurrent postmenopausal bleeding, uterine fibroids.  POSTOPERATIVE DIAGNOSIS:  Recurrent postmenopausal bleeding, uterine fibroids.  PROCEDURE:  A robotic total laparoscopic hysterectomy with bilateral salpingo-oophorectomy and cystoscopy.  SURGEON:  Lenard Galloway, MD  ASSISTANT:  Felipa Emory, MD, Salvadore Dom, MD  ANESTHESIA:  General endotracheal, intraperitoneal ropivacaine 30 mL and 30 mL of normal saline, local 0.25% Marcaine.  IV FLUIDS:  700 mL Ringer's lactate.  ESTIMATED BLOOD LOSS:  50 mL.  URINE OUTPUT:  200 mL.  COMPLICATIONS:  None.  INDICATIONS FOR THE PROCEDURE:  The patient is a 49 year old, gravida 1, para 1 Caucasian female, status post cesarean section x1 and status post bilateral tubal ligation who presents with recurrent postmenopausal bleeding on hormone replacement therapy.  The patient has had multiple alterations of her hormone therapy regimen and it has been difficult to control her bleeding.  By office ultrasound, she has 2 fibroids, one measuring 3.2 cm and encroaching on the endometrium.  There was a smaller 1.4 cm fibroid.  The endometrial stripe has measured 3.71 mm. Her ovaries are unremarkable.  An endometrial biopsy on October 05, 2014, documented weakly proliferative endometrium with no hyperplasia or malignancy.  The patient has requested definitive treatment of her bleeding and fibroids with hysterectomy.  She has a history of premature menopause and has requested removal of tubes and ovaries as well.  A plan is now made to proceed with a robotic total laparoscopic hysterectomy with  bilateral salpingo-oophorectomy and cystoscopy after risks, benefits, and alternatives have been reviewed.  FINDINGS:  Laparoscopy demonstrated a uterus with a 3 cm intramural fundal fibroid.  The fallopian tubes were consistent with tubal ligation and the bilateral ovaries were unremarkable.  There was no evidence of any adhesive disease in the abdomen nor in the pelvis.  In the upper abdomen, the liver and gallbladder were unremarkable.  Cystoscopy at termination of the procedure documented the bladder to be intact throughout 360 degrees including the bladder dome and trigone. There was no evidence of a foreign body in the bladder and the urethra. The ureters were noted to be patent bilaterally.  DESCRIPTION OF PROCEDURE:  The patient was reidentified in the preoperative hold area.  She did receive cefotetan 2 g IV for antibiotic prophylaxis and she received TED hose and PAS stockings for DVT prophylaxis.  In the operating room, the patient was placed in the dorsal lithotomy position with her arms at her sides and she received general endotracheal anesthesia.  The patient had been placed on the anti-slip pad.  The patient's abdomen, vagina, and perineum were then sterilely prepped and she was sterilely draped.  My assistants created the pneumoperitoneum as I placed the vaginal instruments below.  For the pneumoperitoneum, the umbilicus was first injected with 0.25% Marcaine.  The umbilicus was everted with 2 towel clips.  A small incision was created in the umbilicus and the Veress needle was then inserted into the peritoneal cavity.  The saline drop test was performed and the fluid was noted to flow freely.  The CO2 pneumoperitoneum was then achieved without difficulty.  Deaver retractors were placed by me in the vagina to expose the cervix. The cervix was grasped on the anterior lip with a single-tooth tenaculum.  The uterus was sounded to 8 cm.  A figure-of-eight suture of 0  Vicryl was placed on each of the anterior and posterior cervical lips. The cervix was then dilated to a #19 Pratt dilator.  The #8 RUMI tip was then placed inside the uterine cavity with the RUMI device and a small KoH ring.  There was good application of the ring to the cervix.  The Deaver retractors were removed at this time.  A 3-way Foley catheter was placed inside the bladder and left to gravity drainage throughout the procedure and at the termination of the procedure.  At this time, the port sites for the trocars were marked with a marking pen.  A 12 mm supraumbilical port site was planned along with an 8 mm incision to the right and located 10 cm to the right and left of the umbilical trocar site.  Additionally, a 5 mm right lower quadrant assistant port was planned.  All of the sites were injected with 0.25% Marcaine.  The incisions were created with a scalpel.  The supraumbilical trocar was placed first without difficulty.  The laparoscope confirmed proper placement.  The remaining trocars were then each placed under visualization of the laparoscope.  The 2 robotic trocars were 8 mm trocars and the right lower quadrant assistant trocar was a 5-mm trocar. These were placed without difficulty.  The patient was placed in Trendelenburg position at this time.  The robot was then brought into place, and the robot was docked.  The monopolar scissors instrument was placed through the robotic arm #1 on the patient's right-hand side under visualization of the laparoscope and the PK instrument was placed on the robotic arm #2 under visualization of the laparoscope.  The ropivacaine had been placed inside the peritoneal cavity right before docking the instrument.  At this time, I stepped to the surgeon's console while my assistant stayed at the patient's bedside.  The upper abdomen had already been explored with the camera.  An exploration of the pelvis was performed at this time.   The procedure began on the patient's left hand side.  The left ureter was identified. The left infundibulopelvic ligament was triply cauterized with the bipolar PK instrument and was then sharply divided with the monopolar scissors.  Dissection continued through the broad ligament, posteriorly again using the bipolar instrument for cauterization and the monopolar instrument for cutting.  The left round ligament was identified, triply cauterized with the PK instrument and then bisected with the monopolar scissors.  The anterior and posterior peritoneum of the anterior leaf of the broad ligament were then taken down with monopolar cautery.  The bladder flap was taken down anteriorly again using the monopolar scissors for cautery and cutting.  The left uterine artery was skeletonized at this time using the monopolar cautery scissors.  The left uterine artery was then cauterized multiple times with the PK instrument and was bisected with the laparoscopic scissors.  The same procedure that was performed on the patient's left-hand side was repeated on the right-hand side.  The right ureter was identified prior to coagulating  and cutting the right infundibulopelvic ligament. Again, the dissection was performed in a similar fashion through the broad ligament, round ligament and the anterior and posterior leaf of an anterior broad ligament.  The uterine artery was skeletonized in a similar fashion.  The uterine artery was similarly cauterized multiple times, and then bisected with the laparoscopic scissors.  Additional dissection was performed to make sure that the bladder was completely down anteriorly, this was performed with monopolar cautery.  At this time, the uterus was noted to be well blanched and the colpotomy incision was created with the monopolar scissors.  This was performed in a clockwise fashion and directly on top of the KOH ring.  This was performed without difficulty.  The  specimen was removed from the vagina and the balloon occluder was placed vaginally to keep the pneumoperitoneum.  At this time, I sutured the vaginal cuff using a 0 V-Loc suture.  I began suturing at the patient's right vaginal cuff, and performed this in a running fashion all the way to the left side and then back 2 sutures toward the midline.  There was excellent closure of the cuff and good hemostasis.  The pelvis was irrigated and suctioned at this time. A part of the pneumoperitoneum was released and all pedicle sites were examined including the infundibulopelvic pedicles and the round ligaments along with the vaginal cuff.  There was good hemostasis everywhere.  The suture was cut at the vaginal cuff point at this point, and the remaining suture and needle were parked in the anterior peritoneum.  My assistants extracted the needle from within the peritoneal cavity and the robot was undocked and pneumoperitoneum released and trocars then removed while I proceeded below with cystoscopy.  The trocars were removed under visualization of the laparoscope.  The supraumbilical trocar was removed last.  The Foley catheter was removed and cystoscopy was performed and the findings are as noted above.  The Foley catheter was replaced in the bladder and left to gravity drainage.  The supraumbilical incision was closed at this time with a figure-of- eight suture of 0 Vicryl along the fascia and then subcuticular closures of 4-0 Vicryl on the skin.  Dermabond was placed over the incisions and a honeycomb dressing was placed over the umbilical incision.  A Dermabond was placed in the umbilical incision where the Veress needle was used to create the pneumoperitoneum.  This concluded the patient's procedure.  There were no complications. All needle, instrument, and sponge counts were correct.  The patient is escorted to the recovery room in stable and awake condition.      Lenard Galloway,  M.D.     BES/MEDQ  D:  02/14/2015  T:  02/14/2015  Job:  [id number]

## 2015-02-14 NOTE — Progress Notes (Signed)
Day of Surgery Procedure(s) (LRB): ROBOTIC ASSISTED TOTAL HYSTERECTOMY (N/A) BILATERAL SALPINGECTOMY OOPHORECTOMY (Bilateral) CYSTOSCOPY (N/A)  Subjective: Patient reports nausea, incisional pain and no problems voiding.   Took some Percocet and Zofran.  Ambulated.  Objective: I have reviewed patient's vital signs, intake and output and labs. T 98.7, BP 122/62, P 68, RR 18 I/O - 1750 cc - 825 cc UO Hgb 13.1, WBC 10.8  General: cooperative and sleepy but approprate. Resp: clear to auscultation bilaterally Cardio: regular rate and rhythm, S1, S2 normal, no murmur, click, rub or gallop GI: incision: clean, dry and intact and  scant bowel sounds, soft, nontender. Extremities: Ted hose on.  DPs 2+ bilaterally.  Vaginal Bleeding: minimal  Assessment: s/p Procedure(s): ROBOTIC ASSISTED TOTAL HYSTERECTOMY (N/A) BILATERAL SALPINGECTOMY OOPHORECTOMY (Bilateral) CYSTOSCOPY (N/A): stable  Plan: Continue in hospital care due to nausea and inability to tolerate much po.   Will check CBC and BMP in am.  Reviewed surgical findings and procedure with patient.  Questions answered.     Lynnlee Revels A Quincy Simmonds 02/14/2015, 4:56 PM

## 2015-02-14 NOTE — Progress Notes (Signed)
Update History and Physical  No marked change in status since office pre op visit.  Patient examined.  OK to proceed with surgery.

## 2015-02-14 NOTE — Anesthesia Procedure Notes (Signed)
Procedure Name: Intubation Date/Time: 02/14/2015 7:27 AM Performed by: Vernice Jefferson Pre-anesthesia Checklist: Patient identified, Emergency Drugs available, Suction available, Patient being monitored and Timeout performed Patient Re-evaluated:Patient Re-evaluated prior to inductionOxygen Delivery Method: Circle system utilized Preoxygenation: Pre-oxygenation with 100% oxygen Intubation Type: IV induction Ventilation: Mask ventilation without difficulty Laryngoscope Size: Mac and 3 Grade View: Grade I Tube type: Oral Tube size: 7.0 mm Number of attempts: 1 Airway Equipment and Method: Stylet Placement Confirmation: ETT inserted through vocal cords under direct vision,  positive ETCO2 and breath sounds checked- equal and bilateral Secured at: 21 cm Tube secured with: Tape Dental Injury: Teeth and Oropharynx as per pre-operative assessment

## 2015-02-14 NOTE — Anesthesia Postprocedure Evaluation (Signed)
  Anesthesia Post-op Note  Patient: Alexis Young  Procedure(s) Performed: Procedure(s): ROBOTIC ASSISTED TOTAL HYSTERECTOMY (N/A) BILATERAL SALPINGECTOMY OOPHORECTOMY (Bilateral) CYSTOSCOPY (N/A)  Patient Location: [facility]  Anesthesia Type:General  Level of Consciousness: awake, alert  and oriented  Airway and Oxygen Therapy: Patient Spontanous Breathing  Post-op Pain: mild  Post-op Assessment: Post-op Vital signs reviewed and Patient's Cardiovascular Status Stable              Post-op Vital Signs: Reviewed and stable  Last Vitals:  Filed Vitals:   02/14/15 1320  BP:   Pulse:   Temp:   Resp: 18    Complications: No apparent anesthesia complications

## 2015-02-15 ENCOUNTER — Encounter (HOSPITAL_COMMUNITY): Payer: Self-pay | Admitting: Obstetrics and Gynecology

## 2015-02-15 DIAGNOSIS — D259 Leiomyoma of uterus, unspecified: Secondary | ICD-10-CM | POA: Diagnosis not present

## 2015-02-15 LAB — CBC
HCT: 38.4 % (ref 36.0–46.0)
Hemoglobin: 12.4 g/dL (ref 12.0–15.0)
MCH: 27.9 pg (ref 26.0–34.0)
MCHC: 32.3 g/dL (ref 30.0–36.0)
MCV: 86.3 fL (ref 78.0–100.0)
Platelets: 197 10*3/uL (ref 150–400)
RBC: 4.45 MIL/uL (ref 3.87–5.11)
RDW: 13.8 % (ref 11.5–15.5)
WBC: 7.5 10*3/uL (ref 4.0–10.5)

## 2015-02-15 LAB — BASIC METABOLIC PANEL
Anion gap: 3 — ABNORMAL LOW (ref 5–15)
BUN: 8 mg/dL (ref 6–20)
CO2: 27 mmol/L (ref 22–32)
Calcium: 8.6 mg/dL — ABNORMAL LOW (ref 8.9–10.3)
Chloride: 109 mmol/L (ref 101–111)
Creatinine, Ser: 0.97 mg/dL (ref 0.44–1.00)
GFR calc Af Amer: >60 mL/min (ref >60)
GFR calc non Af Amer: >60 mL/min (ref >60)
Glucose, Bld: 103 mg/dL — ABNORMAL HIGH (ref 65–99)
Potassium: 3.8 mmol/L (ref 3.5–5.1)
Sodium: 139 mmol/L (ref 135–145)

## 2015-02-15 MED ORDER — OXYCODONE-ACETAMINOPHEN 5-325 MG PO TABS: 1.0000 | ORAL_TABLET | ORAL | Status: DC | PRN

## 2015-02-15 NOTE — Progress Notes (Signed)
1 Day Post-Op Procedure(s) (LRB): ROBOTIC ASSISTED TOTAL HYSTERECTOMY (N/A) BILATERAL SALPINGECTOMY OOPHORECTOMY (Bilateral) CYSTOSCOPY (N/A)  Subjective: Patient reports tolerating PO and no problems voiding.   Ambulating.  Nausea gone.  Objective: I have reviewed patient's vital signs, intake and output and labs. T max 99.3, BP 114/61, P 70, RR 18 I/0 - 2927.03/1624 cc  General: alert and cooperative Resp: clear to auscultation bilaterally Cardio: regular rate and rhythm, S1, S2 normal, no murmur, click, rub or gallop GI: soft, non-tender; bowel sounds normal; no masses,  no organomegaly and incision: clean, dry and intact Extremities: Ted hose on. Vaginal Bleeding: none  Assessment: s/p Procedure(s): ROBOTIC ASSISTED TOTAL HYSTERECTOMY (N/A) BILATERAL SALPINGECTOMY OOPHORECTOMY (Bilateral) CYSTOSCOPY (N/A): progressing well  Plan: Discharge home  Instructions and precautions reviewed. Rx for Percocet. Follow up in one week.     Alexis Young 02/15/2015, 7:27 AM

## 2015-02-15 NOTE — Discharge Instructions (Signed)
Total Laparoscopic Hysterectomy, Care After °Refer to this sheet in the next few weeks. These instructions provide you with information on caring for yourself after your procedure. Your health care provider may also give you more specific instructions. Your treatment has been planned according to current medical practices, but problems sometimes occur. Call your health care provider if you have any problems or questions after your procedure. °WHAT TO EXPECT AFTER THE PROCEDURE °· Pain and bruising at the incision sites. You will be given pain medicine to control it. °· Menopausal symptoms such as hot flashes, night sweats, and insomnia if your ovaries were removed. °· Sore throat from the breathing tube that was inserted during surgery. °HOME CARE INSTRUCTIONS °· Only take over-the-counter or prescription medicines for pain, discomfort, or fever as directed by your health care provider.   °· Do not take aspirin. It can cause bleeding.   °· Do not drive when taking pain medicine.   °· Follow your health care provider's advice regarding diet, exercise, lifting, driving, and general activities.   °· Resume your usual diet as directed and allowed.   °· Get plenty of rest and sleep.   °· Do not douche, use tampons, or have sexual intercourse for at least 6 weeks, or until your health care provider gives you permission.   °· Change your bandages (dressings) as directed by your health care provider.   °· Monitor your temperature and notify your health care provider of a fever.   °· Take showers instead of baths for 2-3 weeks.   °· Do not drink alcohol until your health care provider gives you permission.   °· If you develop constipation, you may take a mild laxative with your health care provider's permission. Bran foods may help with constipation problems. Drinking enough fluids to keep your urine clear or pale yellow may help as well.   °· Try to have someone home with you for 1-2 weeks to help around the house.    °· Keep all of your follow-up appointments as directed by your health care provider.   °SEEK MEDICAL CARE IF: °· You have swelling, redness, or increasing pain around your incision sites.   °· You have pus coming from your incision.   °· You notice a bad smell coming from your incision.   °· Your incision breaks open.   °· You feel dizzy or lightheaded.   °· You have pain or bleeding when you urinate.   °· You have persistent diarrhea.   °· You have persistent nausea and vomiting.   °· You have abnormal vaginal discharge.   °· You have a rash.   °· You have any type of abnormal reaction or develop an allergy to your medicine.   °· You have poor pain control with your prescribed medicine.   °SEEK IMMEDIATE MEDICAL CARE IF: °· You have chest pain or shortness of breath. °· You have severe abdominal pain that is not relieved with pain medicine. °· You have pain or swelling in your legs. °MAKE SURE YOU: °· Understand these instructions. °· Will watch your condition. °· Will get help right away if you are not doing well or get worse. °Document Released: 05/05/2013 Document Revised: 07/20/2013 Document Reviewed: 05/05/2013 °ExitCare® Patient Information ©2015 [employer]. This information is not intended to replace advice given to you by your health care provider. Make sure you discuss any questions you have with your health care provider. ° °

## 2015-02-15 NOTE — Progress Notes (Signed)
Discharge teaching complete. Pt understood all instructions and did not have any questions. Pt ambulated out of the hospital and discharged home to family.

## 2015-02-20 ENCOUNTER — Encounter: Payer: Self-pay | Admitting: Obstetrics and Gynecology

## 2015-02-20 ENCOUNTER — Ambulatory Visit: Payer: BLUE CROSS/BLUE SHIELD | Admitting: Obstetrics and Gynecology

## 2015-02-20 ENCOUNTER — Ambulatory Visit (INDEPENDENT_AMBULATORY_CARE_PROVIDER_SITE_OTHER): Payer: BLUE CROSS/BLUE SHIELD | Admitting: Obstetrics and Gynecology

## 2015-02-20 VITALS — BP 122/80 | HR 82 | Resp 14 | Wt 193.0 lb

## 2015-02-20 DIAGNOSIS — Z9889 Other specified postprocedural states: Secondary | ICD-10-CM

## 2015-02-20 NOTE — Patient Instructions (Signed)
Continue with decreased activity.  You may start your estrogen again in one week.

## 2015-02-20 NOTE — Progress Notes (Signed)
GYNECOLOGY  VISIT   HPI: 49 y.o.   Married  Caucasian  female   G1P1001 with Patient's last menstrual period was 10/15/2014.   here for   1 week post ROBOTIC ASSISTED TOTAL HYSTERECTOMY (N/A) BILATERAL SALPINGECTOMY OOPHORECTOMY (Bilateral) CYSTOSCOPY (N/A) Taking Percocet at hs only.  No Motrin at all.  Bowel movements are loose.  Voiding well.  No vaginal bleeding.  Some umbilical drainage.   Having some hot flashes. Wants to restart her estrogen.   Pathology - benign fibroids, cervix, tubes and ovaries.   GYNECOLOGIC HISTORY: Patient's last menstrual period was 10/15/2014. Contraception:none Menopausal hormone therapy: none Last mammogram:07/11/14 dense wnl, [facility] Last pap smear: 12/20/14 normal        OB History    Gravida Para Term Preterm AB TAB SAB Ectopic Multiple Living   1 1 1       1          Patient Active Problem List   Diagnosis Date Noted  . Status post laparoscopic hysterectomy 02/14/2015  . Fatigue 05/03/2014  . GERD (gastroesophageal reflux disease) 05/03/2014  . Uterine fibroid 10/07/2013  . Vasomotor flushing 09/21/2013  . Hair loss 09/21/2013  . 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 Abnormal uterine bleeding (AUB) 09/21/2013    Past Medical History  Diagnosis Date  . Fibroid   . Dyspareunia   . Cancer 03/2013, 06/2013    malignant melanoma on right arm and back  . 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 GERD (gastroesophageal reflux disease)     takes protonix as needed   . Arthritis   . Elevated serum creatinine     Past Surgical History  Procedure Laterality Date  . Tubal ligation    . Melanoma excision Right 03/2013    melanoma right arm  . Skin cancer excision  06/2013    midddle of back with path report of atypia  . Cryotherapy  5/91    for persistent condyloma atypia  . Laser ablation of condylomas  03/05/90 Dr. Warnell Forester    perineal area- also had Bowens disease  . Breast surgery Right  04/2010    breast biopsy for multiple fibroadenoma - no atypia  . Breast biopsy  1987    Dr. Rosana Hoes  . Cesarean section    . Tubal ligation Bilateral   . Wisdom tooth extraction    . Robotic assisted total hysterectomy N/A 02/14/2015    Procedure: ROBOTIC ASSISTED TOTAL HYSTERECTOMY;  Surgeon: Nunzio Cobbs, MD;  Location: [facility];  Service: Gynecology;  Laterality: N/A;  . Bilateral salpingectomy Bilateral 02/14/2015    Procedure: BILATERAL SALPINGECTOMY OOPHORECTOMY;  Surgeon: Nunzio Cobbs, MD;  Location: [facility];  Service: Gynecology;  Laterality: Bilateral;  . Cystoscopy N/A 02/14/2015    Procedure: CYSTOSCOPY;  Surgeon: Nunzio Cobbs, MD;  Location: [facility];  Service: Gynecology;  Laterality: N/A;    Current Outpatient Prescriptions  Medication Sig Dispense Refill  . Cholecalciferol (D3 HIGH POTENCY) 1000 UNITS capsule Take 1,000 Units by mouth daily.    Marland Kitchen estradiol (ESTRACE) 1 MG tablet Take 1 tablet (1 mg total) by mouth daily. 90 tablet 0  . Omega-3 Fatty Acids (FISH OIL PO) Take 645 mg by mouth daily.     Marland Kitchen oxyCODONE-acetaminophen (PERCOCET/ROXICET) 5-325 MG per tablet Take 1-2 tablets by mouth every 4 (four) hours as needed for severe pain (moderate to severe pain (when tolerating fluids)). 30 tablet  0  . pantoprazole (PROTONIX) 40 MG tablet Take 1 tablet (40 mg total) by mouth daily. (Patient taking differently: Take 40 mg by mouth daily as needed. ) 30 tablet 3   No current facility-administered medications for this visit.     ALLERGIES: Tetracyclines & related; Asa; and Sulfa antibiotics  Family History  Problem Relation Age of Onset  . Cancer Mother     lung  . Alcohol abuse Father     Cirrhosis of liver  . Cancer Maternal Aunt     stomach cancer  . Cancer Maternal Grandmother     uterine cancer    History   Social History  . Marital Status: Married    Spouse Name: N/A  . Number of Children: 1  . Years of Education: N/A    Occupational History  .     Social History Main Topics  . Smoking status: Never Smoker   . Smokeless tobacco: Never Used  . Alcohol Use: No  . Drug Use: No  . Sexual Activity:    Partners: Male    Birth Control/ Protection: Surgical     Comment: Tubal   Other Topics Concern  . Not on file   Social History Narrative    ROS:  Pertinent items are noted in HPI.  PHYSICAL EXAMINATION:    BP 122/80 mmHg  Pulse 82  Resp 14  Wt 193 lb (87.544 kg)  LMP 10/15/2014    General appearance: alert, cooperative and appears stated age Abdomen: incisions intact. Umbilical site cleansed with H202 and Q-tips. soft, non-tender; bowel sounds normal; no masses,  no organomegaly   Pelvic: External genitalia:  no lesions              Urethra:  normal appearing urethra with no masses, tenderness or lesions              Bartholins and Skenes: normal                 Vagina: normal appearing vagina with normal color and discharge, no lesions.  Cuff intact.               Cervix: absent           Bimanual Exam:  Uterus:  uterus absent              Adnexa: no mass, fullness, tenderness               Chaperone was present for exam.  ASSESSMENT  Doing well post robotic total laparoscopic hysterectomy with BSO.  Menopausal symptoms.   PLAN  Counseled regarding pathology report.  Will wait one more week before starting ERT again.  Discussed risk of DVT/PE. Will continue decreased activity but ambulate several times per day.  Cleanse umbilical area with H371 and Q tip daily.  Follow up for 6 week post op check.    An After Visit Summary was printed and given to the patient.

## 2015-02-22 ENCOUNTER — Other Ambulatory Visit: Payer: Self-pay | Admitting: Internal Medicine

## 2015-03-29 ENCOUNTER — Ambulatory Visit (INDEPENDENT_AMBULATORY_CARE_PROVIDER_SITE_OTHER): Payer: BLUE CROSS/BLUE SHIELD | Admitting: Obstetrics and Gynecology

## 2015-03-29 ENCOUNTER — Encounter: Payer: Self-pay | Admitting: Obstetrics and Gynecology

## 2015-03-29 VITALS — BP 114/78 | HR 68 | Resp 14 | Ht 66.0 in | Wt 194.0 lb

## 2015-03-29 DIAGNOSIS — Z90722 Acquired absence of ovaries, bilateral: Secondary | ICD-10-CM

## 2015-03-29 DIAGNOSIS — Z9079 Acquired absence of other genital organ(s): Secondary | ICD-10-CM

## 2015-03-29 DIAGNOSIS — Z90710 Acquired absence of both cervix and uterus: Secondary | ICD-10-CM

## 2015-03-29 MED ORDER — ESTRADIOL 1 MG PO TABS: 1.0000 mg | ORAL_TABLET | Freq: Every day | ORAL | Status: DC

## 2015-03-29 NOTE — Progress Notes (Signed)
Patient ID: Alexis Young, female   DOB: 08/11/1965, 49 y.o.   MRN: [id number] GYNECOLOGY  VISIT   HPI: 49 y.o.   Married  Caucasian  female   G1P1001 with Patient's last menstrual period was 10/15/2014.   here for Post op from 02/14/15.  ROBOTIC ASSISTED TOTAL HYSTERECTOMY (N/A Abdomen) [[id number] CPT (R)]   BILATERAL SALPINGECTOMY OOPHORECTOMY (Bilateral Abdomen)   CYSTOSCOPY (N/A Urethra)       Some hot flashes.  Needs a refill on Estradiol.   No problems.  No vaginal bleeding.   GYNECOLOGIC HISTORY: Patient's last menstrual period was 10/15/2014. Contraception:Hysterectomy  Menopausal hormone therapy: Estradiol Last mammogram: 12-14- 15 WNL Last pap smear: 12-20-14 WNL NEG HR HPV        OB History    Gravida Para Term Preterm AB TAB SAB Ectopic Multiple Living   1 1 1       1          Patient Active Problem List   Diagnosis Date Noted  . Status post laparoscopic hysterectomy 02/14/2015  . Fatigue 05/03/2014  . GERD (gastroesophageal reflux disease) 05/03/2014  . Uterine fibroid 10/07/2013  . Vasomotor flushing 09/21/2013  . Hair loss 09/21/2013  . 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 Abnormal uterine bleeding (AUB) 09/21/2013    Past Medical History  Diagnosis Date  . Fibroid   . Dyspareunia   . Cancer 03/2013, 06/2013    malignant melanoma on right arm and back  . 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 GERD (gastroesophageal reflux disease)     takes protonix as needed   . Arthritis   . Elevated serum creatinine     Past Surgical History  Procedure Laterality Date  . Tubal ligation    . Melanoma excision Right 03/2013    melanoma right arm  . Skin cancer excision  06/2013    midddle of back with path report of atypia  . Cryotherapy  5/91    for persistent condyloma atypia  . Laser ablation of condylomas  03/05/90 Dr. Warnell Forester    perineal area- also had Bowens disease  . Breast surgery Right 04/2010    breast biopsy for  multiple fibroadenoma - no atypia  . Breast biopsy  1987    Dr. Rosana Hoes  . Cesarean section    . Tubal ligation Bilateral   . Wisdom tooth extraction    . Robotic assisted total hysterectomy N/A 02/14/2015    Procedure: ROBOTIC ASSISTED TOTAL HYSTERECTOMY;  Surgeon: Nunzio Cobbs, MD;  Location: [facility];  Service: Gynecology;  Laterality: N/A;  . Bilateral salpingectomy Bilateral 02/14/2015    Procedure: BILATERAL SALPINGECTOMY OOPHORECTOMY;  Surgeon: Nunzio Cobbs, MD;  Location: [facility];  Service: Gynecology;  Laterality: Bilateral;  . Cystoscopy N/A 02/14/2015    Procedure: CYSTOSCOPY;  Surgeon: Nunzio Cobbs, MD;  Location: [facility];  Service: Gynecology;  Laterality: N/A;  . Abdominal hysterectomy      Current Outpatient Prescriptions  Medication Sig Dispense Refill  . Cholecalciferol (D3 HIGH POTENCY) 1000 UNITS capsule Take 1,000 Units by mouth daily.    Marland Kitchen estradiol (ESTRACE) 1 MG tablet Take 1 tablet (1 mg total) by mouth daily. 90 tablet 0  . Omega-3 Fatty Acids (FISH OIL PO) Take 645 mg by mouth daily.     . pantoprazole (PROTONIX) 40 MG tablet TAKE 1 TABLET EVERY DAY 30 tablet 3   No current facility-administered medications  for this visit.     ALLERGIES: Tetracyclines & related; Asa; and Sulfa antibiotics  Family History  Problem Relation Age of Onset  . Cancer Mother     lung  . Alcohol abuse Father     Cirrhosis of liver  . Cancer Maternal Aunt     stomach cancer  . Cancer Maternal Grandmother     uterine cancer    Social History   Social History  . Marital Status: Married    Spouse Name: N/A  . Number of Children: 1  . Years of Education: N/A   Occupational History  .     Social History Main Topics  . Smoking status: Never Smoker   . Smokeless tobacco: Never Used  . Alcohol Use: No  . Drug Use: No  . Sexual Activity:    Partners: Male    Birth Control/ Protection: Surgical     Comment: Tubal   Other Topics Concern  .  Not on file   Social History Narrative    ROS:  Pertinent items are noted in HPI.  PHYSICAL EXAMINATION:    BP 114/78 mmHg  Pulse 68  Resp 14  Ht 5\' 6"  (1.676 m)  Wt 194 lb (87.998 kg)  BMI 31.33 kg/m2  LMP 10/15/2014    General appearance: alert, cooperative and appears stated age   Abdomen: incisions intact - small pieces of vicryl suture removed, soft, non-tender; bowel sounds normal; no masses,  no organomegaly    Pelvic: External genitalia:  no lesions              Urethra:  normal appearing urethra with no masses, tenderness or lesions              Bartholins and Skenes: normal                 Vagina: normal appearing vagina with normal color and discharge, no lesions              Cervix: absent              Bimanual Exam:  Uterus:  uterus absent              Adnexa: no mass, fullness, tenderness       Chaperone was present for exam.  ASSESSMENT  Doing well post op.  Status post robotic TLH/BSO/cystoscopy.  PLAN  Counseled regarding decreased activity for 2 more weeks.  Can then resume exercise and sexual activity.  Estradiol 1 mg daily.  Rx given until [facility] with [name] in May 2017.  Mammogram due in December 2016.  Patient will schedule.   An After Visit Summary was printed and given to the patient.

## 2015-04-04 ENCOUNTER — Other Ambulatory Visit: Payer: Self-pay | Admitting: Internal Medicine

## 2015-06-01 ENCOUNTER — Telehealth: Payer: Self-pay | Admitting: Internal Medicine

## 2015-06-01 NOTE — Telephone Encounter (Signed)
Pt is requesting to transfer from Dr. Sharlet Salina to Dr. Quay Burow Please advise

## 2015-06-01 NOTE — Telephone Encounter (Signed)
ok 

## 2015-06-01 NOTE — Telephone Encounter (Signed)
Pt informed

## 2015-06-01 NOTE — Telephone Encounter (Signed)
Fine with me

## 2015-06-06 ENCOUNTER — Other Ambulatory Visit (INDEPENDENT_AMBULATORY_CARE_PROVIDER_SITE_OTHER): Payer: BLUE CROSS/BLUE SHIELD

## 2015-06-06 ENCOUNTER — Ambulatory Visit (INDEPENDENT_AMBULATORY_CARE_PROVIDER_SITE_OTHER)
Admission: RE | Admit: 2015-06-06 | Discharge: 2015-06-06 | Disposition: A | Discharge status: Home / Self Care | Payer: BLUE CROSS/BLUE SHIELD | Source: Ambulatory Visit | Attending: Internal Medicine | Admitting: Internal Medicine

## 2015-06-06 ENCOUNTER — Encounter: Payer: Self-pay | Admitting: Internal Medicine

## 2015-06-06 ENCOUNTER — Other Ambulatory Visit: Payer: BLUE CROSS/BLUE SHIELD

## 2015-06-06 ENCOUNTER — Ambulatory Visit (INDEPENDENT_AMBULATORY_CARE_PROVIDER_SITE_OTHER): Payer: BLUE CROSS/BLUE SHIELD | Admitting: Internal Medicine

## 2015-06-06 VITALS — BP 138/80 | HR 63 | Temp 98.0°F | Resp 16 | Wt 196.0 lb

## 2015-06-06 DIAGNOSIS — Z23 Encounter for immunization: Secondary | ICD-10-CM

## 2015-06-06 DIAGNOSIS — M25562 Pain in left knee: Secondary | ICD-10-CM

## 2015-06-06 DIAGNOSIS — M2550 Pain in unspecified joint: Secondary | ICD-10-CM | POA: Diagnosis not present

## 2015-06-06 DIAGNOSIS — M25551 Pain in right hip: Secondary | ICD-10-CM

## 2015-06-06 LAB — C-REACTIVE PROTEIN: CRP: 0.7 mg/dL (ref 0.5–20.0)

## 2015-06-06 LAB — RHEUMATOID FACTOR: Rhuematoid fact SerPl-aCnc: <10 IU/mL (ref <=14)

## 2015-06-06 LAB — SEDIMENTATION RATE: Sed Rate: 21 mm/hr (ref 0–22)

## 2015-06-06 NOTE — Progress Notes (Signed)
Pre visit review using our clinic review tool, if applicable. No additional management support is needed unless otherwise documented below in the visit note. 

## 2015-06-06 NOTE — Patient Instructions (Addendum)
  We have reviewed your prior records including labs and tests today.  Test(s) ordered today. Your results will be released to [facility] [place] (or called to you) after review, usually within 72hours after test completion. If any changes need to be made, you will be notified at that same time.  Flu vaccine administered today.   Medications reviewed and updated.  No changes recommended at this time.   Please schedule followup in with Dr. Tamala Julian to help evaluate your joint pain.

## 2015-06-06 NOTE — Progress Notes (Signed)
Subjective:    Patient ID: Alexis Young, female    DOB: 1965-09-10, 49 y.o.   MRN: [id number]  HPI  She is here for an acute visit for joint pain.  She knows she has arthritis in her feet and ankles.  She has had xrays in the past and uses orthotics.    She has bilateral knee pain and right hip pain.  She thinks this started about 6-7 months ago.  Her left knee bothers her all the time.  The pain is inside the knee and is more of an ache.  She denies swelling.  There has been no injury.  Her right hips bothers her all the time.  She has pain with laying on her right side.  She has a tightening sensation behind the right knee with walking.    She needs to sleep with a pillow between or underneath her legs for her knee pain and can not sleep on her right side because of the hip pain.  She was taking tyenol or advil pm but did not want to take that medication long term and has been taking aleve as needed.  She also takes curumin, tumeric and omega 3.  Her right back hurts when she gets up.  She feels stiff when she wakes up.  She tries to walk 30-40 minutes daily for exercise. The exercise helps the knees, but makes the hip pain worse.  She loses her balance on occasion, she denies dizziness.   Both grandmothers had bad arthritis.  She gets swelling only in her ankles; no other joints swell.   Medications and allergies reviewed with patient and updated if appropriate.  Patient Active Problem List   Diagnosis Date Noted  . Status post laparoscopic hysterectomy 02/14/2015  . Fatigue 05/03/2014  . GERD (gastroesophageal reflux disease) 05/03/2014  . Uterine fibroid 10/07/2013  . Vasomotor flushing 09/21/2013  . Hair loss 09/21/2013  . 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 Abnormal uterine bleeding (AUB) 09/21/2013    Current Outpatient Prescriptions on File Prior to Visit  Medication Sig Dispense Refill  . Cholecalciferol (D3 HIGH POTENCY) 1000 UNITS capsule Take 1,000 Units by  mouth daily.    Marland Kitchen estradiol (ESTRACE) 1 MG tablet Take 1 tablet (1 mg total) by mouth daily. 90 tablet 2  . Omega-3 Fatty Acids (FISH OIL PO) Take 645 mg by mouth daily.     . pantoprazole (PROTONIX) 40 MG tablet TAKE 1 TABLET EVERY DAY (Patient taking differently: TAKE 1 TABLET EVERY DAY PRN) 30 tablet 3   No current facility-administered medications on file prior to visit.    Past Medical History  Diagnosis Date  . Fibroid   . Dyspareunia   . Cancer 03/2013, 06/2013    malignant melanoma on right arm and back  . 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 GERD (gastroesophageal reflux disease)     takes protonix as needed   . Arthritis   . Elevated serum creatinine     Past Surgical History  Procedure Laterality Date  . Tubal ligation    . Melanoma excision Right 03/2013    melanoma right arm  . Skin cancer excision  06/2013    midddle of back with path report of atypia  . Cryotherapy  5/91    for persistent condyloma atypia  . Laser ablation of condylomas  03/05/90 Dr. Randell Patient    perineal area- also had Bowens disease  . Breast surgery  Right 04/2010    breast biopsy for multiple fibroadenoma - no atypia  . Breast biopsy  1987    Dr. Rosana Hoes  . Cesarean section    . Tubal ligation Bilateral   . Wisdom tooth extraction    . Robotic assisted total hysterectomy N/A 02/14/2015    Procedure: ROBOTIC ASSISTED TOTAL HYSTERECTOMY;  Surgeon: Nunzio Cobbs, MD;  Location: [facility];  Service: Gynecology;  Laterality: N/A;  . Bilateral salpingectomy Bilateral 02/14/2015    Procedure: BILATERAL SALPINGECTOMY OOPHORECTOMY;  Surgeon: Nunzio Cobbs, MD;  Location: [facility];  Service: Gynecology;  Laterality: Bilateral;  . Cystoscopy N/A 02/14/2015    Procedure: CYSTOSCOPY;  Surgeon: Nunzio Cobbs, MD;  Location: [facility];  Service: Gynecology;  Laterality: N/A;  . Abdominal hysterectomy      Social History   Social History  . Marital Status:  Married    Spouse Name: N/A  . Number of Children: 1  . Years of Education: N/A   Occupational History  .     Social History Main Topics  . Smoking status: Never Smoker   . Smokeless tobacco: Never Used  . Alcohol Use: No  . Drug Use: No  . Sexual Activity:    Partners: Male    Birth Control/ Protection: Surgical     Comment: Tubal   Other Topics Concern  . Not on file   Social History Narrative    Review of Systems  Constitutional: Negative for fever and chills.  Respiratory: Negative for shortness of breath.   Cardiovascular: Negative for chest pain and palpitations.  Musculoskeletal: Positive for back pain (lower back pain/stiffness in morning or with bending over) and arthralgias.       Stiffness after sitting for long periods or when waking up  Skin: Negative for rash.  Neurological: Negative for dizziness, light-headedness and headaches.       Objective:   Filed Vitals:   06/06/15 0917  BP: 138/80  Pulse: 63  Temp: 98 F (36.7 C)  Resp: 16   Filed Weights   06/06/15 0917  Weight: 196 lb (88.905 kg)   Body mass index is 31.65 kg/(m^2).   Physical Exam  Constitutional: She appears well-developed and well-nourished.  Musculoskeletal:  bilateral knee exam was performed.   SKIN: intact, no bruising or skin discoloration  SWELLING: no EFFUSION: none WARMTH: no warmth  TENDERNESS: no tenderness through out knee ROM: full extension, full flexion  GAIT: normal NEUROLOGICAL EXAM: normal sensation  CALF TENDERNESS: no   No tenderness with palpation of lateral right hip.  Slight pain with right hip flexion No lumbar spine or paravertebral tenderness   No joint deformities or swelling on exam   Neurological:  Normal sensation and strength all extremities  Skin: Skin is warm and dry. No rash noted.  Psychiatric: She has a normal mood and affect. Her behavior is normal.          Assessment & Plan:   Joint pain - diffuse, right hip pain, b/l  knee pain L > R Unlikely autoimmune in nature, but will check ANA, RF, ESR and CRP Will check xrays Possible osteoarthritis, but more likely related to other cause Would like benefit from PT - muscle strengthening - she deferred today Will refer to Dr. Tamala Julian for further evaluation of pain and treatment Discussed ideally she should limit nsaids, but can continue aleve as needed Recommended trying tylenol arthritis Continue regular exercising/ walking Advised weight loss

## 2015-06-07 LAB — ANA: Anti Nuclear Antibody(ANA): POSITIVE — AB

## 2015-06-07 LAB — ANTI-NUCLEAR AB-TITER (ANA TITER): ANA Titer 1: 1:40 {titer} — ABNORMAL HIGH

## 2015-06-08 ENCOUNTER — Encounter: Payer: Self-pay | Admitting: Internal Medicine

## 2015-06-13 ENCOUNTER — Ambulatory Visit: Payer: BLUE CROSS/BLUE SHIELD | Admitting: Internal Medicine

## 2015-06-13 ENCOUNTER — Other Ambulatory Visit: Payer: Self-pay

## 2015-06-13 DIAGNOSIS — Z1231 Encounter for screening mammogram for malignant neoplasm of breast: Secondary | ICD-10-CM

## 2015-06-15 ENCOUNTER — Encounter: Payer: Self-pay | Admitting: Obstetrics and Gynecology

## 2015-06-19 ENCOUNTER — Ambulatory Visit: Payer: BLUE CROSS/BLUE SHIELD | Admitting: Family Medicine

## 2015-06-19 DIAGNOSIS — Z0289 Encounter for other administrative examinations: Secondary | ICD-10-CM

## 2015-07-19 ENCOUNTER — Ambulatory Visit
Admission: RE | Admit: 2015-07-19 | Discharge: 2015-07-19 | Disposition: A | Discharge status: Home / Self Care | Payer: BLUE CROSS/BLUE SHIELD | Source: Ambulatory Visit

## 2015-07-19 DIAGNOSIS — Z1231 Encounter for screening mammogram for malignant neoplasm of breast: Secondary | ICD-10-CM

## 2015-07-30 DIAGNOSIS — M7710 Lateral epicondylitis, unspecified elbow: Secondary | ICD-10-CM

## 2015-07-30 HISTORY — DX: Lateral epicondylitis, unspecified elbow: M77.10

## 2015-09-21 ENCOUNTER — Ambulatory Visit: Payer: Self-pay | Admitting: Family Medicine

## 2015-09-27 ENCOUNTER — Ambulatory Visit: Payer: BLUE CROSS/BLUE SHIELD | Admitting: Family Medicine

## 2015-09-28 ENCOUNTER — Ambulatory Visit (INDEPENDENT_AMBULATORY_CARE_PROVIDER_SITE_OTHER): Payer: BLUE CROSS/BLUE SHIELD | Admitting: Family

## 2015-09-28 ENCOUNTER — Encounter: Payer: Self-pay | Admitting: Family

## 2015-09-28 VITALS — BP 132/84 | HR 65 | Temp 97.6°F | Resp 16 | Ht 66.0 in | Wt 195.0 lb

## 2015-09-28 DIAGNOSIS — M7711 Lateral epicondylitis, right elbow: Secondary | ICD-10-CM | POA: Diagnosis not present

## 2015-09-28 MED ORDER — DICLOFENAC SODIUM 2 % TD SOLN: 1.0000 "application " | Freq: Two times a day (BID) | TRANSDERMAL | Status: DC | PRN

## 2015-09-28 MED ORDER — IBUPROFEN-FAMOTIDINE 800-26.6 MG PO TABS: 1.0000 | ORAL_TABLET | Freq: Three times a day (TID) | ORAL | Status: DC | PRN

## 2015-09-28 NOTE — Assessment & Plan Note (Signed)
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 Treat conservatively with ice and home exercise therapy. Start Duexis and Pennsaid. Tenderness elbow strap as needed for discomfort. Follow-up in 3 weeks if symptoms worsen or do not improve.

## 2015-09-28 NOTE — Progress Notes (Signed)
Pre visit review using our clinic review tool, if applicable. No additional management support is needed unless otherwise documented below in the visit note. 

## 2015-09-28 NOTE — Patient Instructions (Signed)
Thank you for choosing [facility].  Summary/Instructions:  Ice 2-3 times per day and after activity as needed. Duexis - 3 times per day for the next 3-4 days and then as needed. Pennsaid - Pinkie sized dose applied 2 times per day. Stretching exercises daily.  Tennis elbow strap as needed for discomfort.  Your prescription(s) have been submitted to your pharmacy or been printed and provided for you. Please take as directed and contact our office if you believe you are having problem(s) with the medication(s) or have any questions.  If your symptoms worsen or fail to improve, please contact our office for further instruction, or in case of emergency go directly to the emergency room at the closest medical facility.   Lateral Epicondylitis With Rehab Lateral epicondylitis involves inflammation and pain around the outer portion of the elbow. The pain is caused by inflammation of the tendons in the forearm that bring back (extend) the wrist. Lateral epicondylitis is also called tennis elbow, because it is very common in tennis players. However, it may occur in any individual who extends the wrist repetitively. If lateral epicondylitis is left untreated, it may become a chronic problem. SYMPTOMS   Pain, tenderness, and inflammation on the outer (lateral) side of the elbow.  Pain or weakness with gripping activities.  Pain that increases with wrist-twisting motions (playing tennis, using a screwdriver, opening a door or a jar).  Pain with lifting objects, including a coffee cup. CAUSES  Lateral epicondylitis is caused by inflammation of the tendons that extend the wrist. Causes of injury may include:  Repetitive stress and strain on the muscles and tendons that extend the wrist.  Sudden change in activity level or intensity.  Incorrect grip in racquet sports.  Incorrect grip size of racquet (often too large).  Incorrect hitting position or technique (usually backhand, leading  with the elbow).  Using a racket that is too heavy. RISK INCREASES WITH:  Sports or occupations that require repetitive and/or strenuous forearm and wrist movements (tennis, squash, racquetball, carpentry).  Poor wrist and forearm strength and flexibility.  Failure to warm up properly before activity.  Resuming activity before healing, rehabilitation, and conditioning are complete. PREVENTION   Warm up and stretch properly before activity.  Maintain physical fitness:  Strength, flexibility, and endurance.  Cardiovascular fitness.  Wear and use properly fitted equipment.  Learn and use proper technique and have a coach correct improper technique.  Wear a tennis elbow (counterforce) brace. PROGNOSIS  The course of this condition depends on the degree of the injury. If treated properly, acute cases (symptoms lasting less than 4 weeks) are often resolved in 2 to 6 weeks. Chronic (longer lasting cases) often resolve in 3 to 6 months but may require physical therapy. RELATED COMPLICATIONS   Frequently recurring symptoms, resulting in a chronic problem. Properly treating the problem the first time decreases frequency of recurrence.  Chronic inflammation, scarring tendon degeneration, and partial tendon tear, requiring surgery.  Delayed healing or resolution of symptoms. TREATMENT  Treatment first involves the use of ice and medicine to reduce pain and inflammation. Strengthening and stretching exercises may help reduce discomfort if performed regularly. These exercises may be performed at home if the condition is an acute injury. Chronic cases may require a referral to a physical therapist for evaluation and treatment. Your caregiver may advise a corticosteroid injection to help reduce inflammation. Rarely, surgery is needed. MEDICATION  If pain medicine is needed, nonsteroidal anti-inflammatory medicines (aspirin and ibuprofen), or other minor  pain relievers (acetaminophen), are  often advised.  Do not take pain medicine for 7 days before surgery.  Prescription pain relievers may be given, if your caregiver thinks they are needed. Use only as directed and only as much as you need.  Corticosteroid injections may be recommended. These injections should be reserved only for the most severe cases, because they can only be given a certain number of times. HEAT AND COLD  Cold treatment (icing) should be applied for 10 to 15 minutes every 2 to 3 hours for inflammation and pain, and immediately after activity that aggravates your symptoms. Use ice packs or an ice massage.  Heat treatment may be used before performing stretching and strengthening activities prescribed by your caregiver, physical therapist, or athletic trainer. Use a heat pack or a warm water soak. SEEK MEDICAL CARE IF: Symptoms get worse or do not improve in 2 weeks, despite treatment. EXERCISES  RANGE OF MOTION (ROM) AND STRETCHING EXERCISES - Epicondylitis, Lateral (Tennis Elbow) These exercises may help you when beginning to rehabilitate your injury. Your symptoms may go away with or without further involvement from your physician, physical therapist, or athletic trainer. While completing these exercises, remember:   Restoring tissue flexibility helps normal motion to return to the joints. This allows healthier, less painful movement and activity.  An effective stretch should be held for at least 30 seconds.  A stretch should never be painful. You should only feel a gentle lengthening or release in the stretched tissue. RANGE OF MOTION - Wrist Flexion, Active-Assisted  Extend your right / left elbow with your fingers pointing down.*  Gently pull the back of your hand towards you, until you feel a gentle stretch on the top of your forearm.  Hold this position for __________ seconds. Repeat __________ times. Complete this exercise __________ times per day.  *If directed by your physician, physical  therapist or athletic trainer, complete this stretch with your elbow bent, rather than extended. RANGE OF MOTION - Wrist Extension, Active-Assisted  Extend your right / left elbow and turn your palm upwards.*  Gently pull your palm and fingertips back, so your wrist extends and your fingers point more toward the ground.  You should feel a gentle stretch on the inside of your forearm.  Hold this position for __________ seconds. Repeat __________ times. Complete this exercise __________ times per day. *If directed by your physician, physical therapist or athletic trainer, complete this stretch with your elbow bent, rather than extended. STRETCH - Wrist Flexion  Place the back of your right / left hand on a tabletop, leaving your elbow slightly bent. Your fingers should point away from your body.  Gently press the back of your hand down onto the table by straightening your elbow. You should feel a stretch on the top of your forearm.  Hold this position for __________ seconds. Repeat __________ times. Complete this stretch __________ times per day.  STRETCH - Wrist Extension   Place your right / left fingertips on a tabletop, leaving your elbow slightly bent. Your fingers should point backwards.  Gently press your fingers and palm down onto the table by straightening your elbow. You should feel a stretch on the inside of your forearm.  Hold this position for __________ seconds. Repeat __________ times. Complete this stretch __________ times per day.  STRENGTHENING EXERCISES - Epicondylitis, Lateral (Tennis Elbow) These exercises may help you when beginning to rehabilitate your injury. They may resolve your symptoms with or without further involvement from your  physician, physical therapist, or athletic trainer. While completing these exercises, remember:   Muscles can gain both the endurance and the strength needed for everyday activities through controlled exercises.  Complete these  exercises as instructed by your physician, physical therapist or athletic trainer. Increase the resistance and repetitions only as guided.  You may experience muscle soreness or fatigue, but the pain or discomfort you are trying to eliminate should never worsen during these exercises. If this pain does get worse, stop and make sure you are following the directions exactly. If the pain is still present after adjustments, discontinue the exercise until you can discuss the trouble with your caregiver. STRENGTH - Wrist Flexors  Sit with your right / left forearm palm-up and fully supported on a table or countertop. Your elbow should be resting below the height of your shoulder. Allow your wrist to extend over the edge of the surface.  Loosely holding a __________ weight, or a piece of rubber exercise band or tubing, slowly curl your hand up toward your forearm.  Hold this position for __________ seconds. Slowly lower the wrist back to the starting position in a controlled manner. Repeat __________ times. Complete this exercise __________ times per day.  STRENGTH - Wrist Extensors  Sit with your right / left forearm palm-down and fully supported on a table or countertop. Your elbow should be resting below the height of your shoulder. Allow your wrist to extend over the edge of the surface.  Loosely holding a __________ weight, or a piece of rubber exercise band or tubing, slowly curl your hand up toward your forearm.  Hold this position for __________ seconds. Slowly lower the wrist back to the starting position in a controlled manner. Repeat __________ times. Complete this exercise __________ times per day.  STRENGTH - Ulnar Deviators  Stand with a ____________________ weight in your right / left hand, or sit while holding a rubber exercise band or tubing, with your healthy arm supported on a table or countertop.  Move your wrist, so that your pinkie travels toward your forearm and your thumb  moves away from your forearm.  Hold this position for __________ seconds and then slowly lower the wrist back to the starting position. Repeat __________ times. Complete this exercise __________ times per day STRENGTH - Radial Deviators  Stand with a ____________________ weight in your right / left hand, or sit while holding a rubber exercise band or tubing, with your injured arm supported on a table or countertop.  Raise your hand upward in front of you or pull up on the rubber tubing.  Hold this position for __________ seconds and then slowly lower the wrist back to the starting position. Repeat __________ times. Complete this exercise __________ times per day. STRENGTH - Forearm Supinators   Sit with your right / left forearm supported on a table, keeping your elbow below shoulder height. Rest your hand over the edge, palm down.  Gently grip a hammer or a soup ladle.  Without moving your elbow, slowly turn your palm and hand upward to a "thumbs-up" position.  Hold this position for __________ seconds. Slowly return to the starting position. Repeat __________ times. Complete this exercise __________ times per day.  STRENGTH - Forearm Pronators   Sit with your right / left forearm supported on a table, keeping your elbow below shoulder height. Rest your hand over the edge, palm up.  Gently grip a hammer or a soup ladle.  Without moving your elbow, slowly turn your palm  and hand upward to a "thumbs-up" position.  Hold this position for __________ seconds. Slowly return to the starting position. Repeat __________ times. Complete this exercise __________ times per day.  STRENGTH - Grip  Grasp a tennis ball, a dense sponge, or a large, rolled sock in your hand.  Squeeze as hard as you can, without increasing any pain.  Hold this position for __________ seconds. Release your grip slowly. Repeat __________ times. Complete this exercise __________ times per day.  STRENGTH - Elbow  Extensors, Isometric  Stand or sit upright, on a firm surface. Place your right / left arm so that your palm faces your stomach, and it is at the height of your waist.  Place your opposite hand on the underside of your forearm. Gently push up as your right / left arm resists. Push as hard as you can with both arms, without causing any pain or movement at your right / left elbow. Hold this stationary position for __________ seconds. Gradually release the tension in both arms. Allow your muscles to relax completely before repeating.   This information is not intended to replace advice given to you by your health care provider. Make sure you discuss any questions you have with your health care provider.   Document Released: 07/15/2005 Document Revised: 08/05/2014 Document Reviewed: 10/27/2008 Elsevier Interactive Patient Education [employer].

## 2015-09-28 NOTE — Progress Notes (Signed)
Subjective:    Patient ID: Alexis Young, female    DOB: 05/16/1966, 50 y.o.   MRN: [id number]  Chief Complaint  Patient presents with  . Elbow Pain    x1 month and a half he right elbow has been bothering her, it has gotten worse, has limited ROM, the pain going down her arm, already has arthritis in her ankles and feet    HPI:  Alexis Young is a 50 y.o. female who  has a past medical history of Fibroid; Dyspareunia; Cancer ([place]) (03/2013, 06/2013); Premature ovarian failure (12/2008); GERD (gastroesophageal reflux disease); Arthritis; and Elevated serum creatinine. and presents today for an office visit.   This is a new problem. Associated symptom of pain located in her right elbow has been going on for about 1 month. Right hand dominant. Describes a throbbing pain that sometimes radiates down her hand. Denies trauma or repetitive changes. Does have numbness and tingling as well as some weakness. Aggravating factors include picking up things and opening jars. Modifying factors include ibuprofen which does not help greatly with her pain. She does also take tumeric and which she is unsure if it has helped. Does have arthritis in her hips and ankles. Recent blood work was negative for autoimmune.  Allergies  Allergen Reactions  . Tetracyclines & Related Nausea Only  . Asa [Aspirin] Nausea And Vomiting and Rash  . Sulfa Antibiotics Rash     Current Outpatient Prescriptions on File Prior to Visit  Medication Sig Dispense Refill  . Cholecalciferol (D3 HIGH POTENCY) 1000 UNITS capsule Take 1,000 Units by mouth daily.    Marland Kitchen estradiol (ESTRACE) 1 MG tablet Take 1 tablet (1 mg total) by mouth daily. 90 tablet 2  . Omega-3 Fatty Acids (FISH OIL PO) Take 645 mg by mouth daily.     . pantoprazole (PROTONIX) 40 MG tablet TAKE 1 TABLET EVERY DAY (Patient taking differently: TAKE 1 TABLET EVERY DAY PRN) 30 tablet 3   No current facility-administered medications on file prior to visit.      Past Surgical History  Procedure Laterality Date  . Tubal ligation    . Melanoma excision Right 03/2013    melanoma right arm  . Skin cancer excision  06/2013    midddle of back with path report of atypia  . Cryotherapy  5/91    for persistent condyloma atypia  . Laser ablation of condylomas  03/05/90 Dr. Warnell Forester    perineal area- also had Bowens disease  . Breast surgery Right 04/2010    breast biopsy for multiple fibroadenoma - no atypia  . Breast biopsy  1987    Dr. Rosana Hoes  . Cesarean section    . Tubal ligation Bilateral   . Wisdom tooth extraction    . Robotic assisted total hysterectomy N/A 02/14/2015    Procedure: ROBOTIC ASSISTED TOTAL HYSTERECTOMY;  Surgeon: Nunzio Cobbs, MD;  Location: [facility];  Service: Gynecology;  Laterality: N/A;  . Bilateral salpingectomy Bilateral 02/14/2015    Procedure: BILATERAL SALPINGECTOMY OOPHORECTOMY;  Surgeon: Nunzio Cobbs, MD;  Location: [facility];  Service: Gynecology;  Laterality: Bilateral;  . Cystoscopy N/A 02/14/2015    Procedure: CYSTOSCOPY;  Surgeon: Nunzio Cobbs, MD;  Location: [facility];  Service: Gynecology;  Laterality: N/A;  . Abdominal hysterectomy      Past Medical History  Diagnosis Date  . Fibroid   . Dyspareunia   . Cancer ([place]) 03/2013, 06/2013  malignant melanoma on right arm and back  . 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 GERD (gastroesophageal reflux disease)     takes protonix as needed   . Arthritis   . Elevated serum creatinine      Review of Systems  Constitutional: Negative for fever and chills.  Musculoskeletal:       Positive for right elbow pain.   Neurological: Positive for weakness and numbness.      Objective:    BP 132/84 mmHg  Pulse 65  Temp(Src) 97.6 F (36.4 C) (Oral)  Resp 16  Ht 5\' 6"  (1.676 m)  Wt 195 lb (88.451 kg)  BMI 31.49 kg/m2  SpO2 98%  LMP 10/15/2014 Nursing note and vital signs reviewed.  Physical Exam   Constitutional: She is oriented to person, place, and time. She appears well-developed and well-nourished. No distress.  Cardiovascular: Normal rate, regular rhythm, normal heart sounds and intact distal pulses.   Pulmonary/Chest: Effort normal and breath sounds normal.  Musculoskeletal:  Right elbow - no obvious deformity or discoloration. Mild edema. Tenderness elicited along wrist extensor muscle group and lateral upper condyle. Wrist and elbow range of motion are within normal limits. Discomfort noted with wrist extension and passive wrist flexion. Pulses and sensation are intact and appropriate. Negative valgus stress test.  Neurological: She is alert and oriented to person, place, and time.  Skin: Skin is warm and dry.  Psychiatric: She has a normal mood and affect. Her behavior is normal. Judgment and thought content normal.       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 Treat conservatively with ice and home exercise therapy. Start Duexis and Pennsaid. Tenderness elbow strap as needed for discomfort. Follow-up in 3 weeks if symptoms worsen or do not improve.      Relevant Medications   Ibuprofen-Famotidine 800-26.6 MG TABS   Diclofenac Sodium (PENNSAID) 2 % SOLN

## 2015-12-27 ENCOUNTER — Ambulatory Visit: Payer: BLUE CROSS/BLUE SHIELD | Admitting: Nurse Practitioner

## 2015-12-31 ENCOUNTER — Other Ambulatory Visit: Payer: Self-pay | Admitting: Obstetrics and Gynecology

## 2016-01-01 NOTE — Telephone Encounter (Signed)
Medication refill request: estrace Last AEX:  12/20/14 PG Next AEX: 01/10/16 PG Last MMG (if hormonal medication request): 07/20/15 BIRADS1:neg Refill authorized: 03/29/15 #[id number]. Today please advise.

## 2016-01-10 ENCOUNTER — Ambulatory Visit (INDEPENDENT_AMBULATORY_CARE_PROVIDER_SITE_OTHER): Payer: BLUE CROSS/BLUE SHIELD | Admitting: Nurse Practitioner

## 2016-01-10 ENCOUNTER — Encounter: Payer: Self-pay | Admitting: Nurse Practitioner

## 2016-01-10 VITALS — BP 120/70 | HR 70 | Resp 18 | Ht 65.25 in | Wt 196.0 lb

## 2016-01-10 DIAGNOSIS — Z0000 Encounter for general adult medical examination without abnormal findings: Secondary | ICD-10-CM

## 2016-01-10 DIAGNOSIS — R319 Hematuria, unspecified: Secondary | ICD-10-CM | POA: Diagnosis not present

## 2016-01-10 DIAGNOSIS — Z01419 Encounter for gynecological examination (general) (routine) without abnormal findings: Secondary | ICD-10-CM | POA: Diagnosis not present

## 2016-01-10 DIAGNOSIS — Z1211 Encounter for screening for malignant neoplasm of colon: Secondary | ICD-10-CM | POA: Diagnosis not present

## 2016-01-10 LAB — POCT URINALYSIS DIPSTICK
Bilirubin, UA: NEGATIVE
Glucose, UA: NEGATIVE
Ketones, UA: NEGATIVE
Leukocytes, UA: NEGATIVE
Nitrite, UA: NEGATIVE
Protein, UA: NEGATIVE
Urobilinogen, UA: NEGATIVE
pH, UA: 5

## 2016-01-10 LAB — CBC WITH DIFFERENTIAL/PLATELET
Basophils Absolute: 46 {cells}/uL (ref 0–200)
Basophils Relative: 1 %
Eosinophils Absolute: 92 {cells}/uL (ref 15–500)
Eosinophils Relative: 2 %
HCT: 40.8 % (ref 35.0–45.0)
Hemoglobin: 13.1 g/dL (ref 11.7–15.5)
Lymphocytes Relative: 36 %
Lymphs Abs: 1656 {cells}/uL (ref 850–3900)
MCH: 27.9 pg (ref 27.0–33.0)
MCHC: 32.1 g/dL (ref 32.0–36.0)
MCV: 87 fL (ref 80.0–100.0)
MPV: 10.4 fL (ref 7.5–12.5)
Monocytes Absolute: 368 {cells}/uL (ref 200–950)
Monocytes Relative: 8 %
Neutro Abs: 2438 {cells}/uL (ref 1500–7800)
Neutrophils Relative %: 53 %
Platelets: 235 10*3/uL (ref 140–400)
RBC: 4.69 MIL/uL (ref 3.80–5.10)
RDW: 14.3 % (ref 11.0–15.0)
WBC: 4.6 10*3/uL (ref 3.8–10.8)

## 2016-01-10 LAB — HEMOGLOBIN A1C
Hgb A1c MFr Bld: 5.7 % — ABNORMAL HIGH (ref <5.7)
Mean Plasma Glucose: 117 mg/dL

## 2016-01-10 LAB — TSH: TSH: 4.63 mIU/L — ABNORMAL HIGH

## 2016-01-10 MED ORDER — ESTRADIOL 1 MG PO TABS: ORAL_TABLET | ORAL | Status: DC

## 2016-01-10 NOTE — Progress Notes (Signed)
50 y.o. G69P1001 Married  Caucasian Fe here for annual exam.  Pt had 02/14/15 Robotic assisted TAH/BSO.  Feels great and loves that she had the surgery.  She is doing well except for right tennis elbow and pain with ROM.  Also bilateral hip pain and unable to sleep well. The NSAID's do not help.  Patient's last menstrual period was 10/15/2014.          Sexually active: Yes.    The current method of family planning is status post hysterectomy 02/14/15  Exercising: Yes.    Walking Smoker:  no  Health Maintenance: Pap:  12/20/14 Neg. HR HPV:neg MMG:  07/20/15 BIRADS1:neg Colonoscopy:  03/2002 Normal done for IBS -  needs repeat now TDaP:  12/2006 HIV: years ago Labs: Here  Hg:12.8 UA: RBC=Mod - no symptoms   reports that she has never smoked. She has never used smokeless tobacco. She reports that she does not drink alcohol or use illicit drugs.  Past Medical History  Diagnosis Date  . Fibroid   . Dyspareunia   . Cancer ([place]) 03/2013, 06/2013    malignant melanoma on right arm and back  . 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 GERD (gastroesophageal reflux disease)     takes protonix as needed   . Arthritis   . Elevated serum creatinine   . Tennis elbow 2017    Right    Past Surgical History  Procedure Laterality Date  . Tubal ligation    . Melanoma excision Right 03/2013    melanoma right arm  . Skin cancer excision  06/2013    midddle of back with path report of atypia  . Cryotherapy  5/91    for persistent condyloma atypia  . Laser ablation of condylomas  03/05/90 Dr. Warnell Forester    perineal area- also had Bowens disease  . Breast surgery Right 04/2010    breast biopsy for multiple fibroadenoma - no atypia  . Breast biopsy  1987    Dr. Rosana Hoes  . Cesarean section    . Tubal ligation Bilateral   . Wisdom tooth extraction    . Robotic assisted total hysterectomy N/A 02/14/2015    Procedure: ROBOTIC ASSISTED TOTAL HYSTERECTOMY;  Surgeon: Nunzio Cobbs, MD;  Location: [facility];  Service: Gynecology;  Laterality: N/A;  . Bilateral salpingectomy Bilateral 02/14/2015    Procedure: BILATERAL SALPINGECTOMY OOPHORECTOMY;  Surgeon: Nunzio Cobbs, MD;  Location: [facility];  Service: Gynecology;  Laterality: Bilateral;  . Cystoscopy N/A 02/14/2015    Procedure: CYSTOSCOPY;  Surgeon: Nunzio Cobbs, MD;  Location: [facility];  Service: Gynecology;  Laterality: N/A;  . Abdominal hysterectomy      Current Outpatient Prescriptions  Medication Sig Dispense Refill  . Cholecalciferol (D3 HIGH POTENCY) 1000 UNITS capsule Take 1,000 Units by mouth daily.    . Diclofenac Sodium (PENNSAID) 2 % SOLN Place 1 application onto the skin 2 (two) times daily as needed. 112 g 1  . estradiol (ESTRACE) 1 MG tablet TAKE 1 TABLET (1 MG TOTAL) BY MOUTH DAILY. 90 tablet 4  . Ibuprofen-Famotidine 800-26.6 MG TABS Take 1 tablet by mouth 3 (three) times daily as needed. 90 tablet 1  . Omega-3 Fatty Acids (FISH OIL PO) Take 645 mg by mouth daily.     . pantoprazole (PROTONIX) 40 MG tablet TAKE 1 TABLET EVERY DAY (Patient taking differently: TAKE 1 TABLET EVERY DAY PRN) 30 tablet 3  No current facility-administered medications for this visit.    Family History  Problem Relation Age of Onset  . Cancer Mother     lung  . Alcohol abuse Father     Cirrhosis of liver  . Cancer Maternal Aunt     stomach cancer  . Cancer Maternal Grandmother     uterine cancer    ROS:  Pertinent items are noted in HPI.  Otherwise, a comprehensive ROS was negative.  Exam:   BP 120/70 mmHg  Pulse 70  Resp 18  Ht 5' 5.25" (1.657 m)  Wt 196 lb (88.905 kg)  BMI 32.38 kg/m2  LMP 10/15/2014 Height: 5' 5.25" (165.7 cm) Ht Readings from Last 3 Encounters:  01/10/16 5' 5.25" (1.657 m)  09/28/15 5\' 6"  (1.676 m)  03/29/15 5\' 6"  (1.676 m)    General appearance: alert, cooperative and appears stated age Head: Normocephalic, without obvious abnormality, atraumatic Neck: no  adenopathy, supple, symmetrical, trachea midline and thyroid normal to inspection and palpation Lungs: clear to auscultation bilaterally Breasts: normal appearance, no masses or tenderness Heart: regular rate and rhythm Abdomen: soft, non-tender; no masses,  no organomegaly Extremities: extremities normal, atraumatic, no cyanosis or edema Skin: Skin color, texture, turgor normal. No rashes or lesions Lymph nodes: Cervical, supraclavicular, and axillary nodes normal. No abnormal inguinal nodes palpated Neurologic: Grossly normal   Pelvic: External genitalia:  no lesions              Urethra:  normal appearing urethra with no masses, tenderness or lesions              Bartholin's and Skene's: normal                 Vagina: normal appearing vagina with normal color and discharge, no lesions              Cervix: absent              Pap taken: No. Bimanual Exam:  Uterus:  uterus absent              Adnexa: no mass, fullness, tenderness               Rectovaginal: Confirms               Anus:  normal sphincter tone, no lesions  Chaperone present: no  A:  Well Woman with normal exam  History of POF and on HRT since 2010 S/P Robotic assisted TAH/BSO 02/14/15 - remains on ERT  Having increased joint pain - mainly hips and no help with NSAID's  Hematuria - asymptomatic  P:   Reviewed health and wellness pertinent to exam  Pap smear as above  Mammogram is due 04/2016  Refill ERT - Estradiol 1 mg daily for a year  Counseled with risk of DVT, CVA, cancer  Follow with labs  Referral back to Dr. Collene Mares for colonoscopy  Counseled on breast self exam, mammography screening, use and side effects of HRT, adequate intake of calcium and vitamin D, diet and exercise return annually or prn  An After Visit Summary was printed and given to the patient.

## 2016-01-10 NOTE — Patient Instructions (Signed)

## 2016-01-11 LAB — COMPREHENSIVE METABOLIC PANEL
ALT: 9 U/L (ref 6–29)
AST: 13 U/L (ref 10–35)
Albumin: 4.3 g/dL (ref 3.6–5.1)
Alkaline Phosphatase: 79 U/L (ref 33–130)
BUN: 18 mg/dL (ref 7–25)
CO2: 18 mmol/L — ABNORMAL LOW (ref 20–31)
Calcium: 8.8 mg/dL (ref 8.6–10.4)
Chloride: 105 mmol/L (ref 98–110)
Creat: 0.94 mg/dL (ref 0.50–1.05)
Glucose, Bld: 84 mg/dL (ref 65–99)
Potassium: 4.1 mmol/L (ref 3.5–5.3)
Sodium: 142 mmol/L (ref 135–146)
Total Bilirubin: 0.6 mg/dL (ref 0.2–1.2)
Total Protein: 7.2 g/dL (ref 6.1–8.1)

## 2016-01-11 LAB — HIV ANTIBODY (ROUTINE TESTING W REFLEX): HIV 1&2 Ab, 4th Generation: NONREACTIVE

## 2016-01-11 LAB — URINALYSIS, MICROSCOPIC ONLY
Casts: NONE SEEN [LPF]
Crystals: NONE SEEN [HPF]
RBC / HPF: NONE SEEN RBC/HPF (ref <=2)
Yeast: NONE SEEN [HPF]

## 2016-01-11 LAB — LIPID PANEL
Cholesterol: 285 mg/dL — ABNORMAL HIGH (ref 125–200)
HDL: 54 mg/dL (ref >=46)
LDL Cholesterol: 190 mg/dL — ABNORMAL HIGH (ref <130)
Total CHOL/HDL Ratio: 5.3 Ratio — ABNORMAL HIGH (ref <=5.0)
Triglycerides: 204 mg/dL — ABNORMAL HIGH (ref <150)
VLDL: 41 mg/dL — ABNORMAL HIGH (ref <30)

## 2016-01-11 LAB — VITAMIN D 25 HYDROXY (VIT D DEFICIENCY, FRACTURES): Vit D, 25-Hydroxy: 21 ng/mL — ABNORMAL LOW (ref 30–100)

## 2016-01-12 ENCOUNTER — Telehealth: Payer: Self-pay | Admitting: Nurse Practitioner

## 2016-01-12 ENCOUNTER — Other Ambulatory Visit: Payer: Self-pay | Admitting: Nurse Practitioner

## 2016-01-12 DIAGNOSIS — R899 Unspecified abnormal finding in specimens from other organs, systems and tissues: Secondary | ICD-10-CM

## 2016-01-12 LAB — ANA: Anti Nuclear Antibody(ANA): NEGATIVE

## 2016-01-12 MED ORDER — VITAMIN D (ERGOCALCIFEROL) 1.25 MG (50000 UNIT) PO CAPS: 50000.0000 [IU] | ORAL_CAPSULE | ORAL | Status: DC

## 2016-01-12 NOTE — Telephone Encounter (Signed)
Patient is scheduled for a lab appointment on 01/22/2016 at 10 am. Will close encounter.

## 2016-01-12 NOTE — Telephone Encounter (Signed)
Left a message to call us back about her labs.   The lipid panel shows an elevated total cholesterol of 285 compared to 209 last year, the triglycerides also up, LDL at 190 compared to 142.  Does she need dietary consult?  The TSH is also elevated - does she have [facility] of this?  We should repeat this in 2 weeks to confirm not a lab error and if still elevated should treat.  Vit D is low again. What dose of Vit D currently?  CMP, CBC is OK.  HGB AIC is 5.7 same as last year.  ANA is negative this time, 7 months ago was positive.  HIV was negative as expected.  Urine micro with no RBC

## 2016-01-12 NOTE — Telephone Encounter (Signed)
Pt returned call and we have reviewed labs.  She will come in for a repeat TSH in 1-2 weeks. No [place] of hypothyroid.   Will also go back on Rx Vit D for 3 months.  She will also go back on a low cholesterol diet to bring down her cholesterol - she had been off recently due to vacations.

## 2016-01-13 NOTE — Progress Notes (Signed)
Encounter reviewed by Dr. Aundria Rud.  Urine micro also sent.

## 2016-01-15 LAB — HEMOGLOBIN, FINGERSTICK: Hemoglobin, fingerstick: 12.8 g/dL (ref 12.0–16.0)

## 2016-01-19 ENCOUNTER — Telehealth: Payer: Self-pay | Admitting: Nurse Practitioner

## 2016-01-19 NOTE — Telephone Encounter (Signed)
error 

## 2016-01-22 ENCOUNTER — Other Ambulatory Visit (INDEPENDENT_AMBULATORY_CARE_PROVIDER_SITE_OTHER): Payer: BLUE CROSS/BLUE SHIELD

## 2016-01-22 DIAGNOSIS — R899 Unspecified abnormal finding in specimens from other organs, systems and tissues: Secondary | ICD-10-CM

## 2016-01-22 LAB — TSH: TSH: 5.66 mIU/L — ABNORMAL HIGH

## 2016-01-23 ENCOUNTER — Telehealth: Payer: Self-pay | Admitting: Nurse Practitioner

## 2016-01-23 DIAGNOSIS — E039 Hypothyroidism, unspecified: Secondary | ICD-10-CM

## 2016-01-23 MED ORDER — LEVOTHYROXINE SODIUM 50 MCG PO TABS: 50.0000 ug | ORAL_TABLET | Freq: Every day | ORAL | Status: DC

## 2016-01-23 NOTE — Telephone Encounter (Signed)
TSH order

## 2016-03-14 ENCOUNTER — Other Ambulatory Visit (INDEPENDENT_AMBULATORY_CARE_PROVIDER_SITE_OTHER): Payer: BLUE CROSS/BLUE SHIELD

## 2016-03-14 ENCOUNTER — Other Ambulatory Visit: Payer: Self-pay | Admitting: Nurse Practitioner

## 2016-03-14 DIAGNOSIS — E039 Hypothyroidism, unspecified: Secondary | ICD-10-CM

## 2016-03-14 LAB — TSH: TSH: 1.21 mIU/L

## 2016-03-29 ENCOUNTER — Encounter: Payer: Self-pay | Admitting: *Deleted

## 2016-04-15 ENCOUNTER — Other Ambulatory Visit: Payer: Self-pay | Admitting: Nurse Practitioner

## 2016-04-16 NOTE — Telephone Encounter (Signed)
Medication refill request: Levothyroxine 62mcg Last AEX:  01/10/16 PG Next AEX: 01/13/17 Last MMG (if hormonal medication request): 07/19/15 BIRADS1 negative Refill authorized: 01/23/16 #90 w/0 refills today #90 w/0 refills? Please advise

## 2016-06-03 ENCOUNTER — Telehealth: Payer: Self-pay | Admitting: Nurse Practitioner

## 2016-06-03 NOTE — Telephone Encounter (Signed)
Please see telephone encounter dated 01/12/2016. Kem Boroughs, FNP reviewed results with patient from 01/10/2016. Dietary consult was offered, but patient declined and was advised to restart low cholesterol diet. Patient would like to have her cholesterol level rechecked at her lab appointment on 11/14/207 at 9 am.   Dr.Silva, okay to order lipid panel?

## 2016-06-03 NOTE — Telephone Encounter (Signed)
Patient is requesting an additional test for cholesterol at her next lab appointment on 06/11/16 for TSH.

## 2016-06-04 NOTE — Telephone Encounter (Signed)
Left detailed message at number provided [phone], okay per ROI. Advised of message as seen below from [place]. Advised to return call to the office with any further questions or concerns.  Routing to covering provider for final review. Patient agreeable to disposition. Will close encounter.

## 2016-06-04 NOTE — Telephone Encounter (Signed)
I have reviewed the patient's chart, and I am going to recommend a change in the patient's treatment plan.  She has had consistently elevated cholesterol for at least the last 2 years.  Her levels and ratios mean she is at increased risk for cardiovascular disease.   I recommend she follow up with her PCP instead of having the labs done here. I am not trying to obstruct her care. I want to make sure we are really helping her to be heart healthy!  Cc- Lamont Snowball

## 2016-06-11 ENCOUNTER — Other Ambulatory Visit: Payer: BLUE CROSS/BLUE SHIELD

## 2016-06-11 ENCOUNTER — Other Ambulatory Visit: Payer: Self-pay | Admitting: Nurse Practitioner

## 2016-06-11 DIAGNOSIS — E039 Hypothyroidism, unspecified: Secondary | ICD-10-CM

## 2016-06-11 DIAGNOSIS — Z1231 Encounter for screening mammogram for malignant neoplasm of breast: Secondary | ICD-10-CM

## 2016-06-11 LAB — TSH: TSH: 0.02 mIU/L — ABNORMAL LOW

## 2016-06-13 ENCOUNTER — Other Ambulatory Visit: Payer: Self-pay | Admitting: Certified Nurse Midwife

## 2016-06-13 DIAGNOSIS — E039 Hypothyroidism, unspecified: Secondary | ICD-10-CM

## 2016-06-13 MED ORDER — LEVOTHYROXINE SODIUM 50 MCG PO TABS: 50.0000 ug | ORAL_TABLET | Freq: Every day | ORAL | 1 refills | Status: DC

## 2016-06-14 ENCOUNTER — Telehealth: Payer: Self-pay | Admitting: *Deleted

## 2016-06-14 NOTE — Telephone Encounter (Signed)
I have attempted to contact this patient by phone with the following results: left message to return call to [place] at [phone] on answering machine (mobile per [facility]).  Advised message was regarding recent thyroid labs.  [phone] (Mobile) *Preferred*

## 2016-06-14 NOTE — Telephone Encounter (Signed)
-----   Message from Regina Eck, CNM sent at 06/13/2016 10:05 AM EST ----- Notify patient that Her TSH is too low and need to change Synthroid back to 50 mcg at this point and recheck in 4 weeks.  Order placed for labs and Synthroid

## 2016-06-17 ENCOUNTER — Telehealth: Payer: Self-pay | Admitting: Nurse Practitioner

## 2016-06-17 NOTE — Telephone Encounter (Signed)
Patient called and requested to speak with the nurse about her thyroid medication dosage.

## 2016-06-17 NOTE — Telephone Encounter (Signed)
Pt notified in result note.  Closing encounter. 

## 2016-06-17 NOTE — Telephone Encounter (Signed)
Spoke with patient. Patient states she was advised to reduce Synthroid back to 50 mcg on 11/17. Patient states once she looked at her medication bottle she realized she had already been taking 50 mcg. Patient reports taking the 50 mcg for approximately 6 months. Patient states synthroid she has is scored, she can split in half if needed. Advised patient that Melvia Heaps, CNM out of the office, will review when she returns 06/18/16 for clarification of dosage of synthroid and return call. Patient is agreeable.  Melvia Heaps, CNM -please advise?    Notes Recorded by Graylon Good, CMA on 06/14/2016 at 12:35 PM EST Pt notified of TSH results. Pt voices understanding and is agreeable with plan. Lab appointment scheduled for 07/16/16 @ 9am. ------  Notes Recorded by Graylon Good, CMA on 06/14/2016 at 10:40 AM EST I have attempted to contact this patient by phone with the following results: left message to return call to [place] at [phone] on answering machine (mobile per [facility]). Advised message was regarding recent thyroid labs. [phone] (Mobile) *Preferred* ------  Notes Recorded by Regina Eck, CNM on 06/13/2016 at 10:05 AM EST Notify patient that Her TSH is too low and need to change Synthroid back to 50 mcg at this point and recheck in 4 weeks. Order placed for labs and Synthroid

## 2016-06-18 NOTE — Telephone Encounter (Signed)
Spoke with patient, advised as seen below per Deborah Leonard, CNM. Patient verbalizes understanding and is agreeable.   Routing to provider for final review. Patient is agreeable to disposition. Will close encounter.  

## 2016-06-18 NOTE — Telephone Encounter (Signed)
If she truly is taking 50 mcg then have her half and return and as discussed for recheck

## 2016-07-12 ENCOUNTER — Other Ambulatory Visit: Payer: Self-pay | Admitting: Nurse Practitioner

## 2016-07-12 NOTE — Telephone Encounter (Signed)
Medication refill request: Levothyroxine Last AEX:  01/10/16 PG Next AEX: 01/13/17 PG Last MMG (if hormonal medication request): 07/19/15 BIRADS1, Density C[facility]; Scheduled for 07/19/16 Refill authorized: 06/13/16 #30 1R. Please advise. Thank you.

## 2016-07-16 ENCOUNTER — Other Ambulatory Visit: Payer: Self-pay | Admitting: Certified Nurse Midwife

## 2016-07-16 ENCOUNTER — Other Ambulatory Visit (INDEPENDENT_AMBULATORY_CARE_PROVIDER_SITE_OTHER): Payer: BLUE CROSS/BLUE SHIELD

## 2016-07-16 DIAGNOSIS — E039 Hypothyroidism, unspecified: Secondary | ICD-10-CM

## 2016-07-16 LAB — TSH: TSH: 8.74 mIU/L — ABNORMAL HIGH

## 2016-07-18 ENCOUNTER — Telehealth: Payer: Self-pay | Admitting: Nurse Practitioner

## 2016-07-18 LAB — T4, FREE: Free T4: 0.9 ng/dL (ref 0.8–1.8)

## 2016-07-18 NOTE — Telephone Encounter (Signed)
I spoke to [facility] about this patient status and due to not well controlled thyroid with medication, Patty felt she needed to see internal medicine MD for more intensive management.

## 2016-07-18 NOTE — Telephone Encounter (Signed)
Patient called requesting to speak with the nurse about questions regarding her recent lab results she saw on MyChart.

## 2016-07-18 NOTE — Telephone Encounter (Addendum)
Spoke with patient. Patient states she has some questions about MyChart message as seen below. Patient would like to know why she needs f/u with pcp for thyroid since currently being managed by Kem Boroughs, NP. Patient also asking if the results for added labs are back? If not, when will they be? Advised patient can take up to 3 days for labs. Advised patient Kem Boroughs, NP is out of the office today, will review with covering provider and return call with recommendations. Patient is agreeable.  Spoke with lab, Thyroid panel added.   Notes Recorded by Kem Boroughs, FNP on 07/17/2016 at 5:39 PM EST Results via my chart: Please add thyroid panel and free T 4.  Will send Alexis Young a note.  [name], The TSH is now the other way in being too high. I am going to add some other labs to this TSH and I want you to follow with Dr. Pricilla Holm.  Melvia Heaps, CNM -please advise?  Cc: Kem Boroughs, NP

## 2016-07-18 NOTE — Telephone Encounter (Signed)
Left message to call Tamber Burtch at [phone].  

## 2016-07-19 ENCOUNTER — Ambulatory Visit
Admission: RE | Admit: 2016-07-19 | Discharge: 2016-07-19 | Disposition: A | Discharge status: Home / Self Care | Payer: BLUE CROSS/BLUE SHIELD | Source: Ambulatory Visit | Attending: Nurse Practitioner | Admitting: Nurse Practitioner

## 2016-07-19 DIAGNOSIS — Z1231 Encounter for screening mammogram for malignant neoplasm of breast: Secondary | ICD-10-CM

## 2016-07-19 LAB — THYROID PROFILE - CHCC
Free Thyroxine Index: 1.4 (ref 1.4–3.8)
T3 Uptake: 27 % (ref 22–35)
T4, Total: 5.2 ug/dL (ref 4.5–12.0)

## 2016-07-19 NOTE — Telephone Encounter (Signed)
Spoke with patient, advised as seen below per Melvia Heaps, CNM. Patient asking if results are available for added thyroid test? Advised patient thyroid panel has resulted, bu has not been viewed by Kem Boroughs, NP. Advised patient will review with Kem Boroughs, NP and return call with any additional recommendations. Patient states she will go ahead and place call to pcp Dr. Billey Gosling for scheduling.   Kem Boroughs, NP -please advise?

## 2016-07-19 NOTE — Telephone Encounter (Signed)
Left detailed message, advised as seen below. Advised patient to return call to office for any additional questions.  Notes Recorded by Kem Boroughs, FNP on 07/19/2016 at 9:28 AM EST Results via my chart: They came into DL box and I now see them.  Saja, The thyroid panel and free T 4 is all normal. But still go to see PCP.  Routing to provider for final review. Patient is agreeable to disposition. Will close encounter.

## 2016-07-19 NOTE — Telephone Encounter (Signed)
Return call to Jill. °

## 2016-07-31 ENCOUNTER — Ambulatory Visit (INDEPENDENT_AMBULATORY_CARE_PROVIDER_SITE_OTHER): Payer: BLUE CROSS/BLUE SHIELD | Admitting: Internal Medicine

## 2016-07-31 ENCOUNTER — Encounter: Payer: Self-pay | Admitting: Internal Medicine

## 2016-07-31 VITALS — BP 120/84 | HR 62 | Temp 97.8°F | Resp 16 | Wt 185.0 lb

## 2016-07-31 DIAGNOSIS — E039 Hypothyroidism, unspecified: Secondary | ICD-10-CM | POA: Diagnosis not present

## 2016-07-31 DIAGNOSIS — Z23 Encounter for immunization: Secondary | ICD-10-CM

## 2016-07-31 MED ORDER — LEVOTHYROXINE SODIUM 25 MCG PO TABS: 37.5000 ug | ORAL_TABLET | Freq: Every day | ORAL | 1 refills | Status: DC

## 2016-07-31 NOTE — Patient Instructions (Addendum)
Take 37.5 mg of the thyroid medication daily.   Have repeat blood work in about 6 weeks.  The blood work has been ordered.  Your do not need to fast.

## 2016-07-31 NOTE — Assessment & Plan Note (Signed)
Start 37.5 g of levothyroxine daily Recheck TSH, free T4 in approximately 6 weeks We'll adjust medication as needed We'll likely need TSH checked every 6 months

## 2016-07-31 NOTE — Progress Notes (Signed)
Subjective:    Patient ID: Alexis Young, female    DOB: November 01, 1965, 51 y.o.   MRN: [id number]  HPI She is here for follow up.  Hypothyroidism: Her hypothyroidism managed by her gynecologist. She was initially taking 25 g but was then increased to 50 g.  And 25 g was 50 g was too much. Her gynecologist request that she follow up with me. She has not had any symptoms suggestive of an overactive or underactive thyroid. Thing she noticed was some increase in facial acne. She did not have any changes in her energy level, changes in weight, palpitations or changes in her stool. She takes medication daily at 3 AM. She denies any side effects.   She is taking 25 mcg currently.   Medications and allergies reviewed with patient and updated if appropriate.  Patient Active Problem List   Diagnosis Date Noted  . Lateral epicondylitis of right elbow 09/28/2015  . Status post laparoscopic hysterectomy 02/14/2015  . Fatigue 05/03/2014  . GERD (gastroesophageal reflux disease) 05/03/2014  . Uterine fibroid 10/07/2013  . Vasomotor flushing 09/21/2013  . Hair loss 09/21/2013  . 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 Abnormal uterine bleeding (AUB) 09/21/2013    Current Outpatient Prescriptions on File Prior to Visit  Medication Sig Dispense Refill  . estradiol (ESTRACE) 1 MG tablet TAKE 1 TABLET (1 MG TOTAL) BY MOUTH DAILY. 90 tablet 4  . levothyroxine (SYNTHROID, LEVOTHROID) 50 MCG tablet Take 1 tablet (50 mcg total) by mouth daily. (Patient taking differently: Take 25 mcg by mouth daily. ) 30 tablet 1  . Omega-3 Fatty Acids (FISH OIL PO) Take 645 mg by mouth daily.     . pantoprazole (PROTONIX) 40 MG tablet TAKE 1 TABLET EVERY DAY (Patient taking differently: TAKE 1 TABLET EVERY DAY PRN) 30 tablet 3  . Vitamin D, Ergocalciferol, (DRISDOL) 50000 units CAPS capsule Take 1 capsule (50,000 Units total) by mouth every 7 (seven) days. 30 capsule 1   No current facility-administered  medications on file prior to visit.     Past Medical History:  Diagnosis Date  . Arthritis   . 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 Dyspareunia   . Elevated serum creatinine   . Fibroid   . GERD (gastroesophageal reflux disease)    takes protonix as needed   . 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 Tennis elbow 2017   Right    Past Surgical History:  Procedure Laterality Date  . ABDOMINAL HYSTERECTOMY    . BILATERAL SALPINGECTOMY Bilateral 02/14/2015   Procedure: BILATERAL SALPINGECTOMY OOPHORECTOMY;  Surgeon: Nunzio Cobbs, MD;  Location: [facility];  Service: Gynecology;  Laterality: Bilateral;  . BREAST BIOPSY  1987   Dr. Rosana Hoes  . BREAST SURGERY Right 04/2010   breast biopsy for multiple fibroadenoma - no atypia  . CESAREAN SECTION    . CRYOTHERAPY  5/91   for persistent condyloma atypia  . CYSTOSCOPY N/A 02/14/2015   Procedure: CYSTOSCOPY;  Surgeon: Nunzio Cobbs, MD;  Location: [facility];  Service: Gynecology;  Laterality: N/A;  . LASER ABLATION OF CONDYLOMAS  03/05/90 Dr. 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 MELANOMA EXCISION Right 03/2013   melanoma right arm  . ROBOTIC ASSISTED TOTAL HYSTERECTOMY N/A 02/14/2015   Procedure: ROBOTIC ASSISTED TOTAL HYSTERECTOMY;  Surgeon: Nunzio Cobbs, MD;  Location: [facility];  Service: Gynecology;  Laterality: N/A;  . SKIN CANCER EXCISION  06/2013   midddle of back with path report of atypia  . TUBAL LIGATION    . TUBAL LIGATION Bilateral   . WISDOM TOOTH EXTRACTION      Social History   Social History  . Marital status: Married    Spouse name: N/A  . Number of children: 1  . Years of education: N/A   Occupational History  .  [employer]   Social History Main Topics  . Smoking status: Never Smoker  . Smokeless tobacco: Never Used  . Alcohol use No  . Drug use: No  . Sexual activity: Yes    Partners: Male     Birth control/ protection: Surgical     Comment: Hysterectomy   Other Topics Concern  . Not on file   Social History Narrative  . No narrative on file    Family History  Problem Relation Age of Onset  . Cancer Mother     lung  . Alcohol abuse Father     Cirrhosis of liver  . Cancer Maternal Aunt     stomach cancer  . Cancer Maternal Grandmother     uterine cancer    Review of Systems  Constitutional: Negative for fatigue and fever.  Cardiovascular: Negative for chest pain, palpitations and leg swelling.  Gastrointestinal: Negative for constipation and diarrhea.  Neurological: Positive for light-headedness (occasionally). Negative for headaches.       Objective:   Vitals:   07/31/16 1127  BP: 120/84  Pulse: 62  Resp: 16  Temp: 97.8 F (36.6 C)   Filed Weights   07/31/16 1127  Weight: 185 lb (83.9 kg)   Body mass index is 30.55 kg/m.  Wt Readings from Last 3 Encounters:  07/31/16 185 lb (83.9 kg)  01/10/16 196 lb (88.9 kg)  09/28/15 195 lb (88.5 kg)     Physical Exam Constitutional: Appears well-developed and well-nourished. No distress.  HENT:  Head: Normocephalic and atraumatic.  Neck: Neck supple. No tracheal deviation present. No thyromegaly present.  No cervical lymphadenopathy Cardiovascular: Normal rate, regular rhythm and normal heart sounds.   No murmur heard. No carotid bruit .  No edema Pulmonary/Chest: Effort normal and breath sounds normal. No respiratory distress. No has no wheezes. No rales.  Skin: Skin is warm and dry. Not diaphoretic.  Psychiatric: Normal mood and affect. Behavior is normal.          Assessment & Plan:   See Problem List for Assessment and Plan of chronic medical problems.

## 2016-07-31 NOTE — Progress Notes (Signed)
Pre visit review using our clinic review tool, if applicable. No additional management support is needed unless otherwise documented below in the visit note. 

## 2016-11-28 ENCOUNTER — Other Ambulatory Visit (INDEPENDENT_AMBULATORY_CARE_PROVIDER_SITE_OTHER): Payer: BLUE CROSS/BLUE SHIELD

## 2016-11-28 ENCOUNTER — Encounter: Payer: Self-pay | Admitting: Internal Medicine

## 2016-11-28 DIAGNOSIS — E039 Hypothyroidism, unspecified: Secondary | ICD-10-CM | POA: Diagnosis not present

## 2016-11-28 LAB — T4, FREE: Free T4: 0.71 ng/dL (ref 0.60–1.60)

## 2016-11-28 LAB — TSH: TSH: 4.33 u[IU]/mL (ref 0.35–4.50)

## 2017-01-13 ENCOUNTER — Encounter: Payer: Self-pay | Admitting: Nurse Practitioner

## 2017-01-13 ENCOUNTER — Ambulatory Visit (INDEPENDENT_AMBULATORY_CARE_PROVIDER_SITE_OTHER): Payer: BLUE CROSS/BLUE SHIELD | Admitting: Nurse Practitioner

## 2017-01-13 VITALS — BP 110/62 | HR 64 | Ht 65.0 in | Wt 186.0 lb

## 2017-01-13 DIAGNOSIS — Z01419 Encounter for gynecological examination (general) (routine) without abnormal findings: Secondary | ICD-10-CM

## 2017-01-13 DIAGNOSIS — Z0000 Encounter for general adult medical examination without abnormal findings: Secondary | ICD-10-CM | POA: Diagnosis not present

## 2017-01-13 DIAGNOSIS — Z23 Encounter for immunization: Secondary | ICD-10-CM

## 2017-01-13 DIAGNOSIS — E039 Hypothyroidism, unspecified: Secondary | ICD-10-CM | POA: Diagnosis not present

## 2017-01-13 DIAGNOSIS — E559 Vitamin D deficiency, unspecified: Secondary | ICD-10-CM | POA: Diagnosis not present

## 2017-01-13 DIAGNOSIS — R350 Frequency of micturition: Secondary | ICD-10-CM | POA: Diagnosis not present

## 2017-01-13 LAB — POCT URINALYSIS DIPSTICK
Bilirubin, UA: NEGATIVE
Glucose, UA: NEGATIVE
Ketones, UA: NEGATIVE
Leukocytes, UA: NEGATIVE
Nitrite, UA: NEGATIVE
Protein, UA: NEGATIVE
Urobilinogen, UA: 0.2 E.U./dL
pH, UA: 6 (ref 5.0–8.0)

## 2017-01-13 MED ORDER — ESTRADIOL 1 MG PO TABS: ORAL_TABLET | ORAL | 4 refills | Status: DC

## 2017-01-13 MED ORDER — VITAMIN D (ERGOCALCIFEROL) 1.25 MG (50000 UNIT) PO CAPS: 50000.0000 [IU] | ORAL_CAPSULE | ORAL | 1 refills | Status: DC

## 2017-01-13 NOTE — Progress Notes (Signed)
Patient ID: Alexis Young, female   DOB: 21-Jul-1966, 51 y.o.   MRN: [id number]  51 y.o. [id number] Married  Caucasian Fe here for annual exam.  Takes Vit D weekly without missed doses.  She is having some urinary frequency without dysuria.  Denies back pain, fever/ chills.  Patient's last menstrual period was 10/15/2014.          Sexually active: Yes.    The current method of family planning is status post hysterectomy.    Exercising: Yes.    walking Smoker:  no  Health Maintenance: Pap: 12/20/14, Negative with neg HR HPV  08/29/11, Negative with neg HR HPV History of Abnormal Pap: yes, history of cryo years ago MMG: 07/19/16, 3D-yes, Density Category C, Bi-Rads 2:  Benign Self Breast exams: yes Colonoscopy: 03/22/16, Normal, repeat in 10 years BMD: Never  TDaP: 12/29/06 HIV: 01/10/16 Labs: Fasting labs, Vit D and TSH today   reports that she has never smoked. She has never used smokeless tobacco. She reports that she does not drink alcohol or use drugs.  Past Medical History:  Diagnosis Date  . Arthritis   . Cancer ([place]) 03/2013, 06/2013   malignant melanoma on right arm and back  . Dyspareunia   . Elevated serum creatinine   . Fibroid   . GERD (gastroesophageal reflux disease)    takes protonix as needed   . 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 Tennis elbow 2017   Right    Past Surgical History:  Procedure Laterality Date  . BILATERAL SALPINGECTOMY Bilateral 02/14/2015   Procedure: BILATERAL SALPINGECTOMY OOPHORECTOMY;  Surgeon: Nunzio Cobbs, MD;  Location: [facility];  Service: Gynecology;  Laterality: Bilateral;  . BREAST BIOPSY  1987   Dr. Rosana Hoes  . BREAST SURGERY Right 04/2010   breast biopsy for multiple fibroadenoma - no atypia  . CESAREAN SECTION    . CRYOTHERAPY  5/91   for persistent condyloma atypia  . CYSTOSCOPY N/A 02/14/2015   Procedure: CYSTOSCOPY;  Surgeon: Nunzio Cobbs, MD;  Location: [facility];  Service:  Gynecology;  Laterality: N/A;  . LASER ABLATION OF CONDYLOMAS  03/05/90 Dr. 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 MELANOMA EXCISION Right 03/2013   melanoma right arm  . ROBOTIC ASSISTED TOTAL HYSTERECTOMY N/A 02/14/2015   Procedure: ROBOTIC ASSISTED TOTAL HYSTERECTOMY;  Surgeon: Nunzio Cobbs, MD;  Location: [facility];  Service: Gynecology;  Laterality: N/A;  . SKIN CANCER EXCISION  06/2013   midddle of back with path report of atypia  . TUBAL LIGATION Bilateral   . WISDOM TOOTH EXTRACTION      Current Outpatient Prescriptions  Medication Sig Dispense Refill  . estradiol (ESTRACE) 1 MG tablet TAKE 1 TABLET (1 MG TOTAL) BY MOUTH DAILY. 90 tablet 4  . levothyroxine (SYNTHROID, LEVOTHROID) 25 MCG tablet Take 1.5 tablets (37.5 mcg total) by mouth daily before breakfast. 135 tablet 1  . Omega-3 Fatty Acids (FISH OIL PO) Take 645 mg by mouth daily.     . pantoprazole (PROTONIX) 40 MG tablet TAKE 1 TABLET EVERY DAY (Patient taking differently: TAKE 1 TABLET EVERY DAY PRN) 30 tablet 3  . Vitamin D, Ergocalciferol, (DRISDOL) 50000 units CAPS capsule Take 1 capsule (50,000 Units total) by mouth every 7 (seven) days. 30 capsule 1   No current facility-administered medications for this visit.     Family History  Problem Relation Age of Onset  .  Cancer Mother        lung  . Alcohol abuse Father        Cirrhosis of liver  . Cancer Maternal Aunt        stomach cancer  . Cancer Maternal Grandmother        uterine cancer    ROS:  Pertinent items are noted in HPI.  Otherwise, a comprehensive ROS was negative.  Exam:   LMP 10/15/2014    Ht Readings from Last 3 Encounters:  01/10/16 5' 5.25" (1.657 m)  09/28/15 5\' 6"  (1.676 m)  03/29/15 5\' 6"  (1.676 m)    General appearance: alert, cooperative and appears stated age Head: Normocephalic, without obvious abnormality, atraumatic Neck: no adenopathy, supple, symmetrical, trachea midline and thyroid normal to inspection and  palpation Lungs: clear to auscultation bilaterally Breasts: normal appearance, no masses or tenderness Heart: regular rate and rhythm Abdomen: soft, non-tender; no masses,  no organomegaly Extremities: extremities normal, atraumatic, no cyanosis or edema Skin: Skin color, texture, turgor normal. No rashes or lesions.  Multiple moles, scars from melanoma on right arm X 2 Lymph nodes: Cervical, supraclavicular, and axillary nodes normal. No abnormal inguinal nodes palpated Neurologic: Grossly normal   Pelvic: External genitalia:  no lesions              Urethra:  normal appearing urethra with no masses, tenderness or lesions              Bartholin's and Skene's: normal                 Vagina: normal appearing vagina with normal color and discharge, no lesions              Cervix: anteverted              Pap taken: No. Bimanual Exam:  Uterus:  normal size, contour, position, consistency, mobility, non-tender              Adnexa: no mass, fullness, tenderness               Rectovaginal: Confirms               Anus:  normal sphincter tone, no lesions  Chaperone present: yes  A:  Well Woman with normal exam  History of POF and on HRT since 2010  S/P Robotic TAH/BSO 02/14/15  Urinary frequency  History of melanoma right arm X 2   P:   Reviewed health and wellness pertinent to exam  Pap smear: no  Mammogram is due 12/18  Refill on Vit D for a year  Follow with urine C&S, micro  Refill on Estrace 1 mg daily for a year  Counseled with risk of DVT, CVA, cancer.   Follow with labs  Counseled on breast self exam, diet, exercise, adequate intake of calcium and vitamin D return annually or prn  An After Visit Summary was printed and given to the patient.

## 2017-01-13 NOTE — Progress Notes (Signed)
Reviewed personally.  M. Suzanne Arleatha Philipps, MD.  

## 2017-01-13 NOTE — Patient Instructions (Signed)

## 2017-01-14 LAB — CBC
Hematocrit: 38.8 % (ref 34.0–46.6)
Hemoglobin: 12.7 g/dL (ref 11.1–15.9)
MCH: 28.3 pg (ref 26.6–33.0)
MCHC: 32.7 g/dL (ref 31.5–35.7)
MCV: 86 fL (ref 79–97)
Platelets: 234 10*3/uL (ref 150–379)
RBC: 4.49 x10E6/uL (ref 3.77–5.28)
RDW: 14.4 % (ref 12.3–15.4)
WBC: 4.3 10*3/uL (ref 3.4–10.8)

## 2017-01-14 LAB — COMPREHENSIVE METABOLIC PANEL
ALT: 8 IU/L (ref 0–32)
AST: 14 IU/L (ref 0–40)
Albumin/Globulin Ratio: 2 (ref 1.2–2.2)
Albumin: 4.5 g/dL (ref 3.5–5.5)
Alkaline Phosphatase: 74 IU/L (ref 39–117)
BUN/Creatinine Ratio: 17 (ref 9–23)
BUN: 14 mg/dL (ref 6–24)
Bilirubin Total: 0.6 mg/dL (ref 0.0–1.2)
CO2: 25 mmol/L (ref 20–29)
Calcium: 9.2 mg/dL (ref 8.7–10.2)
Chloride: 103 mmol/L (ref 96–106)
Creatinine, Ser: 0.83 mg/dL (ref 0.57–1.00)
GFR calc Af Amer: 94 mL/min/{1.73_m2} (ref >59)
GFR calc non Af Amer: 81 mL/min/{1.73_m2} (ref >59)
Globulin, Total: 2.3 g/dL (ref 1.5–4.5)
Glucose: 94 mg/dL (ref 65–99)
Potassium: 4.6 mmol/L (ref 3.5–5.2)
Sodium: 142 mmol/L (ref 134–144)
Total Protein: 6.8 g/dL (ref 6.0–8.5)

## 2017-01-14 LAB — URINE CULTURE

## 2017-01-14 LAB — LIPID PANEL
Chol/HDL Ratio: 4.1 ratio (ref 0.0–4.4)
Cholesterol, Total: 243 mg/dL — ABNORMAL HIGH (ref 100–199)
HDL: 60 mg/dL (ref >39)
LDL Calculated: 159 mg/dL — ABNORMAL HIGH (ref 0–99)
Triglycerides: 122 mg/dL (ref 0–149)
VLDL Cholesterol Cal: 24 mg/dL (ref 5–40)

## 2017-01-14 LAB — URINALYSIS, MICROSCOPIC ONLY
Casts: NONE SEEN /lpf
Epithelial Cells (non renal): >10 /hpf — AB (ref 0–10)
RBC, UA: NONE SEEN /hpf (ref 0–?)

## 2017-01-14 LAB — TSH: TSH: 5.18 u[IU]/mL — ABNORMAL HIGH (ref 0.450–4.500)

## 2017-01-14 LAB — VITAMIN D 25 HYDROXY (VIT D DEFICIENCY, FRACTURES): Vit D, 25-Hydroxy: 41.5 ng/mL (ref 30.0–100.0)

## 2017-01-15 ENCOUNTER — Telehealth: Payer: Self-pay | Admitting: Nurse Practitioner

## 2017-01-15 NOTE — Telephone Encounter (Signed)
Pt is left a message to cal [place] back about her lab test results.  See results note.

## 2017-01-16 NOTE — Telephone Encounter (Signed)
Spoke with patient, advised as seen below per Kem Boroughs, NP. RN to assist with scheduling f/u with Dr. Quay Burow and return call to patient with appointment information. Patient verbalizes understanding and is agreeable.   Notes recorded by Kem Boroughs, [place] on 01/15/2017 at 5:53 PM EDT Results via my chart: She needs to be informed about elevated TSH - actually her PCP is managing this but wanted her to get back in touch with Dr. Quay Burow. Tried to call her and left a voice mail.  Viewed by [name] on 01/15/2017 6:11 PM  Written by Kem Boroughs, FNP on 01/15/2017 5:53 PM   Alexis Young, The urine culture was negative. Vit D was good this time at 41.5. The TSH had changed and did go up at 5.180. Your PCP will need to follow this and adjust your dose as needed. The lipid panel did show an elevated total and LDL cholesterol. Still need to be following a low cholesterol diet. The kidney, liver, glucose, CBC was normal. I did try to call you with an update. One of the triage nurses will also be trying to call you back about the TSH.

## 2017-01-16 NOTE — Telephone Encounter (Signed)
Spoke with Larene Beach at Dr. Quay Burow office. Patient scheduled for f/u of elevated TSH, labs available for review in EPIC. Patient scheduled on 01/22/17 at 9:30am.

## 2017-01-16 NOTE — Telephone Encounter (Signed)
Spoke with patient, advised of appointment date and time as seen below, patient verbalizes understanding and is agreeable.  Routing to provider for final review. Patient is agreeable to disposition. Will close encounter.

## 2017-01-21 NOTE — Progress Notes (Signed)
Subjective:    Patient ID: Alexis Young, female    DOB: 06-22-1966, 51 y.o.   MRN: [id number]  HPI The patient is here for follow up.  Hypothyroidism:  She is taking her medication daily.  She takes it at 2:30 in the morning daily.  She denies any recent changes in energy or weight that are unexplained.  There have been no changes in bowel movements or hair/skin.  Her gyn tested her blood work and her tsh was slightly elevated and she is here for follow up.     Medications and allergies reviewed with patient and updated if appropriate.  Patient Active Problem List   Diagnosis Date Noted  . Hypothyroidism 07/31/2016  . Lateral epicondylitis of right elbow 09/28/2015  . Status post laparoscopic hysterectomy 02/14/2015  . Fatigue 05/03/2014  . GERD (gastroesophageal reflux disease) 05/03/2014  . Vasomotor flushing 09/21/2013  . Hair loss 09/21/2013  . 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 estradiol (ESTRACE) 1 MG tablet TAKE 1 TABLET (1 MG TOTAL) BY MOUTH DAILY. 90 tablet 4  . Omega-3 Fatty Acids (FISH OIL PO) Take 645 mg by mouth daily.     . Vitamin D, Ergocalciferol, (DRISDOL) 50000 units CAPS capsule Take 1 capsule (50,000 Units total) by mouth every 7 (seven) days. 30 capsule 1  . [DISCONTINUED] levothyroxine (SYNTHROID, LEVOTHROID) 25 MCG tablet Take 1.5 tablets (37.5 mcg total) by mouth daily before breakfast. 135 tablet 1   No current facility-administered medications on file prior to visit.     Past Medical History:  Diagnosis Date  . Arthritis   . Cancer ([place]) 03/2013, 06/2013   malignant melanoma on right arm and back  . Dyspareunia   . Elevated serum creatinine   . Fibroid   . GERD (gastroesophageal reflux disease)    takes protonix as needed   . 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 Tennis elbow 2017   Right    Past Surgical History:   Procedure Laterality Date  . BILATERAL SALPINGECTOMY Bilateral 02/14/2015   Procedure: BILATERAL SALPINGECTOMY OOPHORECTOMY;  Surgeon: Nunzio Cobbs, MD;  Location: [facility];  Service: Gynecology;  Laterality: Bilateral;  . BREAST BIOPSY  1987   Dr. Rosana Hoes  . BREAST SURGERY Right 04/2010   breast biopsy for multiple fibroadenoma - no atypia  . CESAREAN SECTION    . CRYOTHERAPY  5/91   for persistent condyloma atypia  . CYSTOSCOPY N/A 02/14/2015   Procedure: CYSTOSCOPY;  Surgeon: Nunzio Cobbs, MD;  Location: [facility];  Service: Gynecology;  Laterality: N/A;  . LASER ABLATION OF CONDYLOMAS  03/05/90 Dr. 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 MELANOMA EXCISION Right 03/2013   melanoma right arm  . ROBOTIC ASSISTED TOTAL HYSTERECTOMY N/A 02/14/2015   Procedure: ROBOTIC ASSISTED TOTAL HYSTERECTOMY;  Surgeon: Nunzio Cobbs, MD;  Location: [facility];  Service: Gynecology;  Laterality: N/A;  . SKIN CANCER EXCISION  06/2013   midddle of back with path report of atypia  . TUBAL LIGATION Bilateral   . WISDOM TOOTH EXTRACTION      Social History   Social History  . Marital status: Married    Spouse name: N/A  . Number of children: 1  . Years of education: N/A   Occupational History  .  [employer]  Social History Main Topics  . Smoking status: Never Smoker  . Smokeless tobacco: Never Used  . Alcohol use No  . Drug use: No  . Sexual activity: Yes    Partners: Male    Birth control/ protection: Surgical     Comment: Hysterectomy   Other Topics Concern  . Not on file   Social History Narrative  . No narrative on file    Family History  Problem Relation Age of Onset  . Cancer Mother        lung  . Alcohol abuse Father        Cirrhosis of liver  . Cancer Maternal Aunt        stomach cancer  . Cancer Maternal Grandmother        uterine cancer  . Heart failure Paternal Grandmother     Review of Systems  Constitutional:  Negative for fever and unexpected weight change.  Gastrointestinal: Negative for constipation.  Skin:       No changes in hair or skin       Objective:   Vitals:   01/22/17 0932  BP: 118/80  Pulse: 64  Resp: 16  Temp: 97.7 F (36.5 C)   Wt Readings from Last 3 Encounters:  01/22/17 187 lb (84.8 kg)  01/13/17 186 lb (84.4 kg)  07/31/16 185 lb (83.9 kg)   Body mass index is 31.12 kg/m.   Physical Exam    Constitutional: Appears well-developed and well-nourished. No distress.  HENT:  Head: Normocephalic and atraumatic.  Neck: Neck supple. No tracheal deviation present. No thyromegaly present.  No cervical lymphadenopathy Cardiovascular: Normal rate, regular rhythm and normal heart sounds.   No murmur heard.  No edema Pulmonary/Chest: Effort normal and breath sounds normal. No respiratory distress. No has no wheezes. No rales.  Skin: Skin is warm and dry. Not diaphoretic.  Psychiatric: Normal mood and affect. Behavior is normal.      Assessment & Plan:    See Problem List for Assessment and Plan of chronic medical problems.

## 2017-01-22 ENCOUNTER — Ambulatory Visit (INDEPENDENT_AMBULATORY_CARE_PROVIDER_SITE_OTHER): Payer: BLUE CROSS/BLUE SHIELD | Admitting: Internal Medicine

## 2017-01-22 ENCOUNTER — Encounter: Payer: Self-pay | Admitting: Internal Medicine

## 2017-01-22 VITALS — BP 118/80 | HR 64 | Temp 97.7°F | Resp 16 | Wt 187.0 lb

## 2017-01-22 DIAGNOSIS — E039 Hypothyroidism, unspecified: Secondary | ICD-10-CM | POA: Diagnosis not present

## 2017-01-22 MED ORDER — LEVOTHYROXINE SODIUM 50 MCG PO TABS: 50.0000 ug | ORAL_TABLET | Freq: Every day | ORAL | 3 refills | Status: DC

## 2017-01-22 NOTE — Patient Instructions (Signed)
Increase the thyroid medication to 50 mcg.  Have your thyroid blood work done in 6-8 weeks.  We will adjust your medication further if needed.

## 2017-01-22 NOTE — Assessment & Plan Note (Signed)
tsh slightly elevated - increase from 37.5 mcg to 50 mcg Recheck tsh in 6-8 weeks

## 2017-01-30 ENCOUNTER — Other Ambulatory Visit: Payer: Self-pay | Admitting: Internal Medicine

## 2017-02-10 ENCOUNTER — Other Ambulatory Visit: Payer: Self-pay | Admitting: Nurse Practitioner

## 2017-02-10 NOTE — Telephone Encounter (Signed)
eScribe request from [facility] for refill on ESTRADIOL TABLETS Last filled - 01/13/17, #90 X 4 RF Last AEX - 01/13/17 Next AEX - not scheduled at this time Last MMG (if homonal medicaiton) - 07/19/16, Bi-Rads 2:  Benign, repeat screening in one year, [place] denied today as new script sent at annual exam viist. Closing encounter.

## 2017-02-17 ENCOUNTER — Other Ambulatory Visit: Payer: Self-pay | Admitting: *Deleted

## 2017-02-17 NOTE — Telephone Encounter (Signed)
Faxed refill request received from [facility] for ESTRADIOL Last filled by MD on 01/13/17, #90 X 4 RF Last AEX - 01/13/17 PG Next AEX - not scheduled Last MMG - 07/19/16,Bi-Rads 2:  Benign  Patient was given new RX at time of annual exam. Refill denied.

## 2017-04-04 ENCOUNTER — Other Ambulatory Visit (INDEPENDENT_AMBULATORY_CARE_PROVIDER_SITE_OTHER): Payer: BLUE CROSS/BLUE SHIELD

## 2017-04-04 DIAGNOSIS — E039 Hypothyroidism, unspecified: Secondary | ICD-10-CM

## 2017-04-04 LAB — TSH: TSH: 1.72 u[IU]/mL (ref 0.35–4.50)

## 2017-04-05 ENCOUNTER — Encounter: Payer: Self-pay | Admitting: Internal Medicine

## 2017-06-09 ENCOUNTER — Other Ambulatory Visit: Payer: Self-pay | Admitting: Nurse Practitioner

## 2017-06-09 DIAGNOSIS — Z1231 Encounter for screening mammogram for malignant neoplasm of breast: Secondary | ICD-10-CM

## 2017-07-24 ENCOUNTER — Ambulatory Visit
Admission: RE | Admit: 2017-07-24 | Discharge: 2017-07-24 | Disposition: A | Discharge status: Home / Self Care | Payer: BLUE CROSS/BLUE SHIELD | Source: Ambulatory Visit | Attending: Nurse Practitioner | Admitting: Nurse Practitioner

## 2017-07-24 DIAGNOSIS — Z1231 Encounter for screening mammogram for malignant neoplasm of breast: Secondary | ICD-10-CM

## 2017-11-07 NOTE — Progress Notes (Signed)
[facility] Note  11/10/2017     CHIEF COMPLAINT Patient presents for Retina Evaluation   HISTORY OF PRESENT ILLNESS: Alexis Young is a 52 y.o. female who presents to the clinic today for:   HPI    Retina Evaluation    In both eyes.  This started 1 month ago.  Associated Symptoms Floaters, Flashes and Pain.  Negative for Distortion, Photophobia, Trauma, Jaw Claudication, Fever, Fatigue, Weight Loss, Shoulder/Hip pain, Scalp Tenderness, Glare, Redness and Blind Spot.  Context:  distance vision, mid-range vision and near vision.  Treatments tried include eye drops.  Response to treatment was no improvement.  I, the attending physician,  performed the HPI with the patient and updated documentation appropriately.          Comments    Referral of Dr. Lucita Ferrara for retina evuation. Patient states she may be cataract sx ou and needs cataract clearance, she is undecided to go forward with the surgery at this time. 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 Pt uses Inveltys gtt's TID, XIIdra Gtt's Bid.        Last edited by Bernarda Caffey, MD on 11/10/2017  9:33 AM. (History)    Pt states she was seen by Dr. Lucita Ferrara and states "he wants to put a multifocal lens in my eyes"; Pt states she initially went to Dr. Lucita Ferrara for a new study, states she was not a candidate; Pt reports Dr. Lucita Ferrara then sent to here for retinal exam; Pt is CTL wearer, states she has been wearing glasses and CTL since kindergarten;   Referring physician: Vevelyn Royals, MD [id number] [place]. [place], [place] [address]  HISTORICAL INFORMATION:   Selected notes from the MEDICAL RECORD NUMBER Referred by Dr. Lucita Ferrara for clearance of IC8 lense;  LEE- 04.10.19 (K. Stonecipher) [BCVA OD: 20/25-2 OS: 20/30 MRx OD: -11.75+0.25x090 OS:  -11.50sph] Ocular Hx- high myopia PMH- hyperthyroidism    CURRENT MEDICATIONS: Current Outpatient Medications (Ophthalmic Drugs)  Medication Sig  . INVELTYS 1 % SUSP INSTILL 1 DSROP INTO BOTH EYES THREE TIMES A DAY  . XIIDRA 5 % SOLN INSTILL 1 DROP INTO BOTH EYES TWICE A DAY   No current facility-administered medications for this visit.  (Ophthalmic Drugs)   Current Outpatient Medications (Other)  Medication Sig  . estradiol (ESTRACE) 1 MG tablet TAKE 1 TABLET (1 MG TOTAL) BY MOUTH DAILY.  Marland Kitchen levothyroxine (SYNTHROID, LEVOTHROID) 50 MCG tablet Take 1 tablet (50 mcg total) by mouth daily.  . Omega-3 Fatty Acids (FISH OIL PO) Take 645 mg by mouth daily.   . Vitamin D, Ergocalciferol, (DRISDOL) 50000 units CAPS capsule Take 1 capsule (50,000 Units total) by mouth every 7 (seven) days.   No current facility-administered medications for this visit.  (Other)      REVIEW OF SYSTEMS: ROS    Positive for: Eyes   Negative for: Constitutional, Gastrointestinal, Neurological, Skin, Genitourinary, Musculoskeletal, HENT, Endocrine, Cardiovascular, Respiratory, Psychiatric, Allergic/Imm, Heme/Lymph   Last edited by Zenovia Jordan, LPN on 9/62/2297  9:89 AM. (History)       ALLERGIES Allergies  Allergen Reactions  . Tetracyclines & Related Nausea Only  . Asa [Aspirin] Nausea And Vomiting and Rash  . Sulfa Antibiotics Rash    PAST MEDICAL HISTORY Past Medical History:  Diagnosis Date  . Arthritis   . Cancer ([place])  03/2013, 06/2013   malignant melanoma on right arm and back  . Dyspareunia   . Elevated serum creatinine   . Fibroid   . GERD (gastroesophageal reflux disease)    takes protonix as needed   . 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 Tennis elbow 2017   Right   Past Surgical History:  Procedure Laterality Date  . BILATERAL SALPINGECTOMY Bilateral 02/14/2015   Procedure: BILATERAL SALPINGECTOMY OOPHORECTOMY;  Surgeon: Nunzio Cobbs,  MD;  Location: [facility];  Service: Gynecology;  Laterality: Bilateral;  . BREAST BIOPSY  1987   Dr. Rosana Hoes  . BREAST EXCISIONAL BIOPSY Left    benign  . BREAST SURGERY Right 04/2010   breast biopsy for multiple fibroadenoma - no atypia  . CESAREAN SECTION    . CRYOTHERAPY  5/91   for persistent condyloma atypia  . CYSTOSCOPY N/A 02/14/2015   Procedure: CYSTOSCOPY;  Surgeon: Nunzio Cobbs, MD;  Location: [facility];  Service: Gynecology;  Laterality: N/A;  . LASER ABLATION OF CONDYLOMAS  03/05/90 Dr. 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 MELANOMA EXCISION Right 03/2013   melanoma right arm  . ROBOTIC ASSISTED TOTAL HYSTERECTOMY N/A 02/14/2015   Procedure: ROBOTIC ASSISTED TOTAL HYSTERECTOMY;  Surgeon: Nunzio Cobbs, MD;  Location: [facility];  Service: Gynecology;  Laterality: N/A;  . SKIN CANCER EXCISION  06/2013   midddle of back with path report of atypia  . TUBAL LIGATION Bilateral   . WISDOM TOOTH EXTRACTION      FAMILY HISTORY Family History  Problem Relation Age of Onset  . Cancer Mother        lung  . Alcohol abuse Father        Cirrhosis of liver  . Cancer Maternal Aunt        stomach cancer  . Cancer Maternal Grandmother        uterine cancer  . Heart failure Paternal Grandmother     SOCIAL HISTORY Social History   Tobacco Use  . Smoking status: Never Smoker  . Smokeless tobacco: Never Used  Substance Use Topics  . Alcohol use: No  . Drug use: No         OPHTHALMIC EXAM:  Base Eye Exam    Visual Acuity (Snellen - Linear)      Right Left   Dist cc 20/20 -1 20/30 -2   Dist ph cc NI 20/25 -2   Correction:  Contacts       Tonometry (Tonopen, 9:10 AM)      Right Left   Pressure 18 18       Pupils      Dark Light Shape React APD   Right 4 2 Round Brisk None   Left 4 2 Round Brisk None       Visual Fields (Counting fingers)      Left Right    Full Full       Extraocular Movement      Right Left    Full, Ortho Full,  Ortho       Neuro/Psych    Oriented x3:  Yes   Mood/Affect:  Normal       Dilation    Both eyes:  1.0% Mydriacyl, 2.5% Phenylephrine @ 9:10 AM        Slit Lamp and Fundus Exam    Slit Lamp Exam      Right Left  Lids/Lashes Normal Normal   Conjunctiva/Sclera White and quiet White and quiet   Cornea Mild Arcus, trace Punctate epithelial erosions Mild Arcus, trace Punctate epithelial erosions   Anterior Chamber Deep and quiet Deep and quiet   Iris Round and dilated Round and dilated   Lens 2+ Nuclear sclerosis, 2+ Cortical cataract 2+ Nuclear sclerosis, 2+ Cortical cataract   Vitreous Vitreous syneresis Vitreous syneresis, Posterior vitreous detachment       Fundus Exam      Right Left   Disc mild tilt, Temporal Peripapillary atrophy mild tilt, Temporal Peripapillary atrophy   C/D Ratio 0.3 0.3   Macula Good foveal reflex, pigment mottling and clumping - worse nasal to fovea, No heme or edema Good foveal reflex, mild Retinal pigment epithelial mottling, No heme or edema   Vessels Normal Normal   Periphery Attached, scattered mild RPE changes, focal area of chorioretinal atrophy at 0430, mild peripheral cystoid degeneration temporally and mild cobblestoning, oral bay cyst at 0730 Attached, pigmented cobblestoning inferiorly, oral bay cyst in supratemporal quadrant        Refraction    Wearing Rx      Sphere Cylinder   Right -9.50 Sphere   Left -9.50 Sphere  contacts       Manifest Refraction      Sphere Cylinder Axis Dist VA   Right -11.25 Sphere  20/25+1   Left -11.25 +0.50 180 20/25-1          IMAGING AND PROCEDURES  Imaging and Procedures for 11/10/17  OCT, Retina - OU - Both Eyes       Right Eye Quality was good. Central Foveal Thickness: 290. Progression has no prior data. Findings include normal foveal contour, no IRF, no SRF (Rare drusen, VMA).   Left Eye Quality was good. Central Foveal Thickness: 289. Progression has no prior data. Findings  include normal foveal contour, no IRF, no SRF, vitreomacular adhesion .   Notes *Images captured and stored on drive  Diagnosis / Impression:  NFP, No IRF/SRF OU   Clinical management:  See below  Abbreviations: NFP - Normal foveal profile. CME - cystoid macular edema. PED - pigment epithelial detachment. IRF - intraretinal fluid. SRF - subretinal fluid. EZ - ellipsoid zone. ERM - epiretinal membrane. ORA - outer retinal atrophy. ORT - outer retinal tubulation. SRHM - subretinal hyper-reflective material                  ASSESSMENT/PLAN:    ICD-10-CM   1. 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 Posterior vitreous detachment of left eye H43.812   3. Retinal edema H35.81 OCT, Retina - OU - Both Eyes  4. Combined forms of age-related cataract of both eyes H25.813     1. High myopia with peripheral myopia-related RPE changes - no retinal tears or detachments on scleral depressed exam - discussed findings and prognosis - monitor - clear from a retina standpoint to undergo cataract surgery  2. PVD / vitreous syneresis OS  Discussed findings and prognosis  No RT or RD on 360 scleral depressed exam  Reviewed s/s of RT/RD  Strict return precautions for any such RT/RD signs/symptoms  - F/U PRN  3. No retinal edema on exam or OCT  4. Combined form age-related cataract OU-  - The symptoms of cataract, surgical options, and treatments and risks were discussed with patient. - discussed diagnosis and progression - likely visually significant - cleared to proceed with cataract surgery from retinal standpoint with expert surgeon, Dr.  Stonecipher - OD scheduled for June 25th - will dictate letter to Dr. Lucita Ferrara   Ophthalmic Meds Ordered this visit:  No orders of the defined types were placed in this encounter.      Return if symptoms worsen or fail to improve.  There are no Patient Instructions on file for this visit.   Explained the diagnoses, plan, and follow  up with the patient and they expressed understanding.  Patient expressed understanding of the importance of proper follow up care.   This document serves as a record of services personally performed by Gardiner Sleeper, MD, PhD. It was created on their behalf by Catha Brow, Regino Ramirez, a certified ophthalmic assistant. The creation of this record is the provider's dictation and/or activities during the visit.  Electronically signed by: Catha Brow, COA  11/10/17 3:11 PM   Gardiner Sleeper, M.D., Ph.D. Diseases & Surgery of the Retina and [place] 11/10/17  I have reviewed the above documentation for accuracy and completeness, and I agree with the above. Gardiner Sleeper, M.D., Ph.D. 11/10/17 3:11 PM     Abbreviations: M myopia (nearsighted); A astigmatism; H hyperopia (farsighted); P presbyopia; Mrx spectacle prescription;  CTL contact lenses; OD right eye; OS left eye; OU both eyes  XT exotropia; ET esotropia; PEK punctate epithelial keratitis; PEE punctate epithelial erosions; DES dry eye syndrome; MGD meibomian gland dysfunction; ATs artificial tears; PFAT's preservative free artificial tears; [place] nuclear sclerotic cataract; PSC posterior subcapsular cataract; ERM epi-retinal membrane; PVD posterior vitreous detachment; RD retinal detachment; DM diabetes mellitus; DR diabetic retinopathy; NPDR non-proliferative diabetic retinopathy; PDR proliferative diabetic retinopathy; CSME clinically significant macular edema; DME diabetic macular edema; dbh dot blot hemorrhages; CWS cotton wool spot; POAG primary open angle glaucoma; C/D cup-to-disc ratio; HVF humphrey visual field; GVF goldmann visual field; OCT optical coherence tomography; IOP intraocular pressure; BRVO Branch retinal vein occlusion; CRVO central retinal vein occlusion; CRAO central retinal artery occlusion; BRAO branch retinal artery occlusion; RT retinal tear; SB scleral buckle; PPV pars plana  vitrectomy; VH Vitreous hemorrhage; PRP panretinal laser photocoagulation; IVK intravitreal kenalog; VMT vitreomacular traction; MH Macular hole;  NVD neovascularization of the disc; NVE neovascularization elsewhere; AREDS age related eye disease study; ARMD age related macular degeneration; POAG primary open angle glaucoma; EBMD epithelial/anterior basement membrane dystrophy; ACIOL anterior chamber intraocular lens; IOL intraocular lens; PCIOL posterior chamber intraocular lens; Phaco/IOL phacoemulsification with intraocular lens placement; [place] photorefractive keratectomy; LASIK laser assisted in situ keratomileusis; HTN hypertension; DM diabetes mellitus; COPD chronic obstructive pulmonary disease

## 2017-11-10 ENCOUNTER — Encounter (INDEPENDENT_AMBULATORY_CARE_PROVIDER_SITE_OTHER): Payer: Self-pay | Admitting: Ophthalmology

## 2017-11-10 ENCOUNTER — Ambulatory Visit (INDEPENDENT_AMBULATORY_CARE_PROVIDER_SITE_OTHER): Payer: BLUE CROSS/BLUE SHIELD | Admitting: Ophthalmology

## 2017-11-10 DIAGNOSIS — H3581 Retinal edema: Secondary | ICD-10-CM | POA: Diagnosis not present

## 2017-11-10 DIAGNOSIS — H43812 Vitreous degeneration, left eye: Secondary | ICD-10-CM

## 2017-11-10 DIAGNOSIS — H4423 Degenerative myopia, bilateral: Secondary | ICD-10-CM

## 2017-11-10 DIAGNOSIS — H25813 Combined forms of age-related cataract, bilateral: Secondary | ICD-10-CM

## 2017-11-17 ENCOUNTER — Encounter (INDEPENDENT_AMBULATORY_CARE_PROVIDER_SITE_OTHER): Payer: BLUE CROSS/BLUE SHIELD | Admitting: Ophthalmology

## 2018-01-13 ENCOUNTER — Other Ambulatory Visit: Payer: BLUE CROSS/BLUE SHIELD

## 2018-01-14 NOTE — Patient Instructions (Addendum)
  Test(s) ordered today. Your results will be released to [facility] (or called to you) after review, usually within 72hours after test completion. If any changes need to be made, you will be notified at that same time.  Medications reviewed and updated.  No changes recommended at this time.  Your prescription(s) have been submitted to your pharmacy. Please take as directed and contact our office if you believe you are having problem(s) with the medication(s).   Please followup in one year

## 2018-01-14 NOTE — Progress Notes (Signed)
Subjective:    Patient ID: Alexis Young, female    DOB: January 24, 1966, 52 y.o.   MRN: [id number]  HPI The patient is here for follow up.  Hypothyroidism Alexis Young is a 52 y.o. female who presents for follow up of hypothyroidism. Current symptoms: palpitations . Patient denies change in energy level, diarrhea, heat / cold intolerance and weight changes. Symptoms have been well-controlled.  She is taking her medication daily as prescribed.  Palpitations: On occasion she will have episodes of palpitations associated with SOB.  It occurs more when she lays on her left side.  Sometimes coughing helps.  It is transient.   She denies chest pain.  The episodes have not been increasing in frequency or lasting longer.  Hyperlipidemia: She is not on any medicatoin. She is not compliant with a low fat/cholesterol diet. She is exercising regularly - walking.     Hyperglycemia: She has had a few elevated sugars in the past and a couple of years ago her A1c was 5.7%. She is trying to eat as well as she can, but could do better.  She is walking for exercise.  Medications and allergies reviewed with patient and updated if appropriate.  Patient Active Problem List   Diagnosis Date Noted  . Hypothyroidism 07/31/2016  . Lateral epicondylitis of right elbow 09/28/2015  . Status post laparoscopic hysterectomy 02/14/2015  . Fatigue 05/03/2014  . GERD (gastroesophageal reflux disease) 05/03/2014  . Vasomotor flushing 09/21/2013  . Hair loss 09/21/2013  . 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 estradiol (ESTRACE) 1 MG tablet TAKE 1 TABLET (1 MG TOTAL) BY MOUTH DAILY. 90 tablet 4  . INVELTYS 1 % SUSP INSTILL 1 DSROP INTO BOTH EYES THREE TIMES A DAY  1  . levothyroxine (SYNTHROID, LEVOTHROID) 50 MCG tablet Take 1 tablet (50 mcg total) by mouth daily. 90 tablet 3  . Omega-3 Fatty Acids (FISH OIL PO) Take 645  mg by mouth daily.     . Vitamin D, Ergocalciferol, (DRISDOL) 50000 units CAPS capsule Take 1 capsule (50,000 Units total) by mouth every 7 (seven) days. 30 capsule 1  . XIIDRA 5 % SOLN INSTILL 1 DROP INTO BOTH EYES TWICE A DAY  12   No current facility-administered medications on file prior to visit.     Past Medical History:  Diagnosis Date  . Arthritis   . Cancer ([place]) 03/2013, 06/2013   malignant melanoma on right arm and back  . Dyspareunia   . Elevated serum creatinine   . Fibroid   . GERD (gastroesophageal reflux disease)    takes protonix as needed   . 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 Tennis elbow 2017   Right    Past Surgical History:  Procedure Laterality Date  . BILATERAL SALPINGECTOMY Bilateral 02/14/2015   Procedure: BILATERAL SALPINGECTOMY OOPHORECTOMY;  Surgeon: Nunzio Cobbs, MD;  Location: [facility];  Service: Gynecology;  Laterality: Bilateral;  . BREAST BIOPSY  1987   Dr. Rosana Hoes  . BREAST EXCISIONAL BIOPSY Left    benign  . BREAST SURGERY Right 04/2010   breast biopsy for multiple fibroadenoma - no atypia  . CESAREAN SECTION    . CRYOTHERAPY  5/91   for persistent condyloma atypia  . CYSTOSCOPY N/A 02/14/2015   Procedure: CYSTOSCOPY;  Surgeon: Nunzio Cobbs, MD;  Location: [facility];  Service: Gynecology;  Laterality: N/A;  . LASER ABLATION OF CONDYLOMAS  03/05/90 Dr. 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 MELANOMA EXCISION Right 03/2013   melanoma right arm  . ROBOTIC ASSISTED TOTAL HYSTERECTOMY N/A 02/14/2015   Procedure: ROBOTIC ASSISTED TOTAL HYSTERECTOMY;  Surgeon: Nunzio Cobbs, MD;  Location: [facility];  Service: Gynecology;  Laterality: N/A;  . SKIN CANCER EXCISION  06/2013   midddle of back with path report of atypia  . TUBAL LIGATION Bilateral   . WISDOM TOOTH EXTRACTION      Social History   Socioeconomic History  . Marital status: Married    Spouse name: Not on file  . Number  of children: 1  . Years of education: Not on file  . Highest education level: Not on file  Occupational History    Employer: [employer]  Social Needs  . Financial resource strain: Not on file  . Food insecurity:    Worry: Not on file    Inability: Not on file  . Transportation needs:    Medical: Not on file    Non-medical: Not on file  Tobacco Use  . Smoking status: Never Smoker  . Smokeless tobacco: Never Used  Substance and Sexual Activity  . Alcohol use: No  . Drug use: No  . Sexual activity: Yes    Partners: Male    Birth control/protection: Surgical    Comment: Hysterectomy  Lifestyle  . Physical activity:    Days per week: Not on file    Minutes per session: Not on file  . Stress: Not on file  Relationships  . Social connections:    Talks on phone: Not on file    Gets together: Not on file    Attends religious service: Not on file    Active member of club or organization: Not on file    Attends meetings of clubs or organizations: Not on file    Relationship status: Not on file  Other Topics Concern  . Not on file  Social History Narrative  . Not on file    Family History  Problem Relation Age of Onset  . Cancer Mother        lung  . Alcohol abuse Father        Cirrhosis of liver  . Cancer Maternal Aunt        stomach cancer  . Cancer Maternal Grandmother        uterine cancer  . Heart failure Paternal Grandmother     Review of Systems  Constitutional: Negative for chills, fatigue (fair - has a lot going on) and fever.  Respiratory: Negative for cough, shortness of breath and wheezing.   Cardiovascular: Positive for palpitations and leg swelling (right ankle swelling). Negative for chest pain.  Neurological: Negative for light-headedness and headaches.       Objective:   Vitals:   01/15/18 0759  BP: 112/70  Pulse: 68  Resp: 16  Temp: 97.8 F (36.6 C)  SpO2: 98%   BP Readings from Last 3 Encounters:  01/15/18 112/70    01/22/17 118/80  01/13/17 110/62   Wt Readings from Last 3 Encounters:  01/15/18 194 lb (88 kg)  01/22/17 187 lb (84.8 kg)  01/13/17 186 lb (84.4 kg)   Body mass index is 32.28 kg/m.   Physical Exam    Constitutional: Appears well-developed and well-nourished. No distress.  HENT:  Head: Normocephalic and atraumatic.  Neck: Neck  supple. No tracheal deviation present. No thyromegaly present.  No cervical lymphadenopathy Cardiovascular: Normal rate, regular rhythm and normal heart sounds.   No murmur heard. No carotid bruit .  No edema Pulmonary/Chest: Effort normal and breath sounds normal. No respiratory distress. No has no wheezes. No rales.  Skin: Skin is warm and dry. Not diaphoretic.  Psychiatric: Normal mood and affect. Behavior is normal.      Assessment & Plan:    See Problem List for Assessment and Plan of chronic medical problems.

## 2018-01-15 ENCOUNTER — Other Ambulatory Visit (INDEPENDENT_AMBULATORY_CARE_PROVIDER_SITE_OTHER): Payer: BLUE CROSS/BLUE SHIELD

## 2018-01-15 ENCOUNTER — Encounter: Payer: Self-pay | Admitting: Internal Medicine

## 2018-01-15 ENCOUNTER — Ambulatory Visit: Payer: BLUE CROSS/BLUE SHIELD | Admitting: Internal Medicine

## 2018-01-15 ENCOUNTER — Other Ambulatory Visit: Payer: Self-pay | Admitting: Internal Medicine

## 2018-01-15 VITALS — BP 112/70 | HR 68 | Temp 97.8°F | Resp 16 | Wt 194.0 lb

## 2018-01-15 DIAGNOSIS — R7303 Prediabetes: Secondary | ICD-10-CM

## 2018-01-15 DIAGNOSIS — E782 Mixed hyperlipidemia: Secondary | ICD-10-CM | POA: Diagnosis not present

## 2018-01-15 DIAGNOSIS — E039 Hypothyroidism, unspecified: Secondary | ICD-10-CM

## 2018-01-15 DIAGNOSIS — E785 Hyperlipidemia, unspecified: Secondary | ICD-10-CM | POA: Insufficient documentation

## 2018-01-15 DIAGNOSIS — R739 Hyperglycemia, unspecified: Secondary | ICD-10-CM | POA: Diagnosis not present

## 2018-01-15 DIAGNOSIS — R002 Palpitations: Secondary | ICD-10-CM | POA: Diagnosis not present

## 2018-01-15 LAB — COMPREHENSIVE METABOLIC PANEL
ALT: 11 U/L (ref 0–35)
AST: 13 U/L (ref 0–37)
Albumin: 4.2 g/dL (ref 3.5–5.2)
Alkaline Phosphatase: 67 U/L (ref 39–117)
BUN: 12 mg/dL (ref 6–23)
CO2: 27 mEq/L (ref 19–32)
Calcium: 9 mg/dL (ref 8.4–10.5)
Chloride: 105 mEq/L (ref 96–112)
Creatinine, Ser: 0.83 mg/dL (ref 0.40–1.20)
GFR: 76.42 mL/min (ref >60.00)
Glucose, Bld: 96 mg/dL (ref 70–99)
Potassium: 4 mEq/L (ref 3.5–5.1)
Sodium: 140 mEq/L (ref 135–145)
Total Bilirubin: 0.5 mg/dL (ref 0.2–1.2)
Total Protein: 7.2 g/dL (ref 6.0–8.3)

## 2018-01-15 LAB — LIPID PANEL
Cholesterol: 234 mg/dL — ABNORMAL HIGH (ref 0–200)
HDL: 54 mg/dL (ref >39.00)
LDL Cholesterol: 152 mg/dL — ABNORMAL HIGH (ref 0–99)
NonHDL: 180
Total CHOL/HDL Ratio: 4
Triglycerides: 138 mg/dL (ref 0.0–149.0)
VLDL: 27.6 mg/dL (ref 0.0–40.0)

## 2018-01-15 LAB — TSH: TSH: 3.52 u[IU]/mL (ref 0.35–4.50)

## 2018-01-15 LAB — HEMOGLOBIN A1C: Hgb A1c MFr Bld: 5.7 % (ref 4.6–6.5)

## 2018-01-15 NOTE — Assessment & Plan Note (Signed)
Check a1c Low sugar / carb diet Stressed regular exercise, weight loss  

## 2018-01-15 NOTE — Assessment & Plan Note (Signed)
Intermittent palpitations, transient Often clear with coughing Sometimes worse when she lays on her left side Discussed possible cardiology referral-she deferred for now Check basic blood work including TSH

## 2018-01-15 NOTE — Assessment & Plan Note (Addendum)
Check tsh  Clinically euthyroid Titrate med dose if needed

## 2018-01-15 NOTE — Assessment & Plan Note (Signed)
Check lipid panel  Regular exercise and healthy diet encouraged  

## 2018-02-16 ENCOUNTER — Other Ambulatory Visit: Payer: Self-pay

## 2018-02-16 MED ORDER — ESTRADIOL 1 MG PO TABS: ORAL_TABLET | ORAL | 0 refills | Status: DC

## 2018-02-16 NOTE — Telephone Encounter (Signed)
Medication refill request: estradiol 1mg  tab  Last AEX:  01/13/17 Next AEX: 02/25/18 Last MMG (if hormonal medication request): 07/24/17 Bi Rads category 1 neg  Refill authorized: please refill until aex if appropriate.

## 2018-02-23 ENCOUNTER — Encounter: Payer: Self-pay | Admitting: Internal Medicine

## 2018-02-24 NOTE — Progress Notes (Signed)
52 y.o. G7P1001 Married Caucasian female here for annual exam.    On ERT.  Wants to continue.   Has some urgency to void.  No urinary incontinence other than a few drops.  Waiting too long to void.  Sometimes leaks with sneezing.  Not a significant problem. Not doing Kegel's. Does like pineapple juice.   Son going to college this year at [employer] this year.   Labs with PCP.   PCP:   Billey Gosling, MD  Patient's last menstrual period was 10/15/2014 (exact date).           Sexually active: Yes.    The current method of family planning is status post hysterectomy.    Exercising: Yes.    walking Smoker:  no  Health Maintenance: Pap:     12/20/14, Negative with neg HR HPV             08/29/11, Negative with neg HR HPV History of Abnormal Pap: yes, history of cryo years ago.  Final surgical pathology of cervix - benign.  MMG: 07/24/2017 Birads 1 negative Self Breast exams: yes Colonoscopy: 03/22/16, Normal, repeat in 10 years BMD: Never  TDaP: 01/13/17 HIV: 01/10/16 Labs: with PCP, Urine sample left if needed   reports that she has never smoked. She has never used smokeless tobacco. She reports that she does not drink alcohol or use drugs.  Past Medical History:  Diagnosis Date  . Arthritis   . Cancer ([place]) 03/2013, 06/2013   malignant melanoma on right arm and back  . Dyspareunia   . Elevated serum creatinine   . Fibroid   . GERD (gastroesophageal reflux disease)    takes protonix as needed   . 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 Tennis elbow 2017   Right    Past Surgical History:  Procedure Laterality Date  . BILATERAL SALPINGECTOMY Bilateral 02/14/2015   Procedure: BILATERAL SALPINGECTOMY OOPHORECTOMY;  Surgeon: Nunzio Cobbs, MD;  Location: [facility];  Service: Gynecology;  Laterality: Bilateral;  . BREAST BIOPSY  1987   Dr. Rosana Hoes  . BREAST EXCISIONAL BIOPSY Left    benign  . BREAST SURGERY Right 04/2010   breast biopsy for  multiple fibroadenoma - no atypia  . CATARACT EXTRACTION, BILATERAL    . CESAREAN SECTION    . CRYOTHERAPY  5/91   for persistent condyloma atypia  . CYSTOSCOPY N/A 02/14/2015   Procedure: CYSTOSCOPY;  Surgeon: Nunzio Cobbs, MD;  Location: [facility];  Service: Gynecology;  Laterality: N/A;  . LASER ABLATION OF CONDYLOMAS  03/05/90 Dr. 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 MELANOMA EXCISION Right 03/2013   melanoma right arm  . ROBOTIC ASSISTED TOTAL HYSTERECTOMY N/A 02/14/2015   Procedure: ROBOTIC ASSISTED TOTAL HYSTERECTOMY;  Surgeon: Nunzio Cobbs, MD;  Location: [facility];  Service: Gynecology;  Laterality: N/A;  . SKIN CANCER EXCISION  06/2013   midddle of back with path report of atypia  . TUBAL LIGATION Bilateral   . WISDOM TOOTH EXTRACTION      Current Outpatient Medications  Medication Sig Dispense Refill  . estradiol (ESTRACE) 1 MG tablet TAKE 1 TABLET (1 MG TOTAL) BY MOUTH DAILY. 30 tablet 0  . INVELTYS 1 % SUSP INSTILL 1 DSROP INTO BOTH EYES THREE TIMES A DAY  1  . levothyroxine (SYNTHROID, LEVOTHROID) 50 MCG tablet TAKE 1 TABLET BY MOUTH EVERY DAY 90 tablet 3  .  Omega-3 Fatty Acids (FISH OIL PO) Take 645 mg by mouth daily.     . Vitamin D, Ergocalciferol, (DRISDOL) 50000 units CAPS capsule Take 1 capsule (50,000 Units total) by mouth every 7 (seven) days. 30 capsule 1  . XIIDRA 5 % SOLN INSTILL 1 DROP INTO BOTH EYES TWICE A DAY  12   No current facility-administered medications for this visit.     Family History  Problem Relation Age of Onset  . Cancer Mother        lung  . Alcohol abuse Father        Cirrhosis of liver  . Cancer Maternal Aunt        stomach cancer  . Cancer Maternal Grandmother        uterine cancer  . Heart failure Paternal Grandmother     Review of Systems  Constitutional: Negative.   HENT: Negative.   Eyes: Negative.   Respiratory: Negative.   Cardiovascular: Negative.   Endocrine: Negative.   Genitourinary:  Positive for urgency.  Musculoskeletal: Negative.   Skin: Negative.   Allergic/Immunologic: Negative.   Neurological: Negative.   Hematological: Negative.   Psychiatric/Behavioral: Negative.     Exam:   BP 126/80 (BP Location: Right Arm, Patient Position: Sitting)   Pulse 80   Ht 5\' 5"  (1.651 m)   Wt 191 lb (86.6 kg)   LMP 10/15/2014 (Exact Date)   BMI 31.78 kg/m     General appearance: alert, cooperative and appears stated age Head: Normocephalic, without obvious abnormality, atraumatic Neck: no adenopathy, supple, symmetrical, trachea midline and thyroid normal to inspection and palpation Lungs: clear to auscultation bilaterally Breasts: normal appearance, no masses or tenderness, No nipple retraction or dimpling, No nipple discharge or bleeding, No axillary or supraclavicular adenopathy Heart: regular rate and rhythm Abdomen: soft, non-tender; no masses, no organomegaly Extremities: extremities normal, atraumatic, no cyanosis or edema Skin: Skin color, texture, turgor normal. No rashes or lesions Lymph nodes: Cervical, supraclavicular, and axillary nodes normal. No abnormal inguinal nodes palpated Neurologic: Grossly normal  Pelvic: External genitalia:  no lesions              Urethra:  normal appearing urethra with no masses, tenderness or lesions              Bartholins and Skenes: normal                 Vagina: normal appearing vagina with normal color and discharge, no lesions              Cervix:  absent              Pap taken: No. Bimanual Exam:  Uterus:   absent              Adnexa: no mass, fullness, tenderness              Rectal exam: Yes.  .  Confirms.              Anus:  normal sphincter tone, no lesions  Chaperone was present for exam.  Assessment:   Well woman visit with normal exam. Status post robotic hysterectomy and BSO. Hx premature ovarian failure.  On ERT. Hx melanoma. Hx hypothyroidism.  Managed by PCP.  Elevated LDL cholesterol. On high  dose vit D.  Urinary incontinence - mild mixed incontinence.  Plan: Mammogram screening. Recommended self breast awareness. Pap and HR HPV as above. Guidelines for Calcium, Vitamin D, regular exercise program including cardiovascular and  weight bearing exercise. Check Vit D.  No refill of high dose vit D at this time.   Refill of Estrace.  I discussed WHI and risks of stroke, DVT, and PE.  Urine dip.  Kegel's.  Follow up annually and prn.   After visit summary provided.

## 2018-02-25 ENCOUNTER — Other Ambulatory Visit: Payer: Self-pay

## 2018-02-25 ENCOUNTER — Ambulatory Visit (INDEPENDENT_AMBULATORY_CARE_PROVIDER_SITE_OTHER): Payer: BLUE CROSS/BLUE SHIELD | Admitting: Obstetrics and Gynecology

## 2018-02-25 ENCOUNTER — Encounter: Payer: Self-pay | Admitting: Obstetrics and Gynecology

## 2018-02-25 VITALS — BP 126/80 | HR 80 | Ht 65.0 in | Wt 191.0 lb

## 2018-02-25 DIAGNOSIS — Z01419 Encounter for gynecological examination (general) (routine) without abnormal findings: Secondary | ICD-10-CM | POA: Diagnosis not present

## 2018-02-25 DIAGNOSIS — R7989 Other specified abnormal findings of blood chemistry: Secondary | ICD-10-CM

## 2018-02-25 DIAGNOSIS — N3946 Mixed incontinence: Secondary | ICD-10-CM | POA: Diagnosis not present

## 2018-02-25 LAB — POCT URINALYSIS DIPSTICK
Bilirubin, UA: NEGATIVE
Blood, UA: POSITIVE
Glucose, UA: NEGATIVE
Ketones, UA: NEGATIVE
Leukocytes, UA: NEGATIVE
Nitrite, UA: NEGATIVE
Protein, UA: NEGATIVE
Spec Grav, UA: 1.01 (ref 1.010–1.025)
Urobilinogen, UA: 0.2 E.U./dL
pH, UA: 5 (ref 5.0–8.0)

## 2018-02-25 MED ORDER — ESTRADIOL 1 MG PO TABS: ORAL_TABLET | ORAL | 3 refills | Status: DC

## 2018-02-25 NOTE — Patient Instructions (Addendum)
EXERCISE AND DIET:  We recommended that you start or continue a regular exercise program for good health. Regular exercise means any activity that makes your heart beat faster and makes you sweat.  We recommend exercising at least 30 minutes per day at least 3 days a week, preferably 4 or 5.  We also recommend a diet low in fat and sugar.  Inactivity, poor dietary choices and obesity can cause diabetes, heart attack, stroke, and kidney damage, among others.    ALCOHOL AND SMOKING:  Women should limit their alcohol intake to no more than 7 drinks/beers/glasses of wine (combined, not each!) per week. Moderation of alcohol intake to this level decreases your risk of breast cancer and liver damage. And of course, no recreational drugs are part of a healthy lifestyle.  And absolutely no smoking or even second hand smoke. Most people know smoking can cause heart and lung diseases, but did you know it also contributes to weakening of your bones? Aging of your skin?  Yellowing of your teeth and nails?  CALCIUM AND VITAMIN D:  Adequate intake of calcium and Vitamin D are recommended.  The recommendations for exact amounts of these supplements seem to change often, but generally speaking 600 mg of calcium (either carbonate or citrate) and 800 units of Vitamin D per day seems prudent. Certain women may benefit from higher intake of Vitamin D.  If you are among these women, your doctor will have told you during your visit.    PAP SMEARS:  Pap smears, to check for cervical cancer or precancers,  have traditionally been done yearly, although recent scientific advances have shown that most women can have pap smears less often.  However, every woman still should have a physical exam from her gynecologist every year. It will include a breast check, inspection of the vulva and vagina to check for abnormal growths or skin changes, a visual exam of the cervix, and then an exam to evaluate the size and shape of the uterus and  ovaries.  And after 53 years of age, a rectal exam is indicated to check for rectal cancers. We will also provide age appropriate advice regarding health maintenance, like when you should have certain vaccines, screening for sexually transmitted diseases, bone density testing, colonoscopy, mammograms, etc.   MAMMOGRAMS:  All women over 40 years old should have a yearly mammogram. Many facilities now offer a "3D" mammogram, which may cost around $50 extra out of pocket. If possible,  we recommend you accept the option to have the 3D mammogram performed.  It both reduces the number of women who will be called back for extra views which then turn out to be normal, and it is better than the routine mammogram at detecting truly abnormal areas.    COLONOSCOPY:  Colonoscopy to screen for colon cancer is recommended for all women at age 50.  We know, you hate the idea of the prep.  We agree, BUT, having colon cancer and not knowing it is worse!!  Colon cancer so often starts as a polyp that can be seen and removed at colonscopy, which can quite literally save your life!  And if your first colonoscopy is normal and you have no family history of colon cancer, most women don't have to have it again for 10 years.  Once every ten years, you can do something that may end up saving your life, right?  We will be happy to help you get it scheduled when you are ready.    Be sure to check your insurance coverage so you understand how much it will cost.  It may be covered as a preventative service at no cost, but you should check your particular policy.      Kegel Exercises Kegel exercises help strengthen the muscles that support the rectum, vagina, small intestine, bladder, and uterus. Doing Kegel exercises can help:  Improve bladder and bowel control.  Improve sexual response.  Reduce problems and discomfort during pregnancy.  Kegel exercises involve squeezing your pelvic floor muscles, which are the same muscles you  squeeze when you try to stop the flow of urine. The exercises can be done while sitting, standing, or lying down, but it is best to vary your position. Phase 1 exercises 1. Squeeze your pelvic floor muscles tight. You should feel a tight lift in your rectal area. If you are a female, you should also feel a tightness in your vaginal area. Keep your stomach, buttocks, and legs relaxed. 2. Hold the muscles tight for up to 10 seconds. 3. Relax your muscles. Repeat this exercise 50 times a day or as many times as told by your health care provider. Continue to do this exercise for at least 4-6 weeks or for as long as told by your health care provider. This information is not intended to replace advice given to you by your health care provider. Make sure you discuss any questions you have with your health care provider. Document Released: 07/01/2012 Document Revised: 03/09/2016 Document Reviewed: 06/04/2015 Elsevier Interactive Patient Education  2018 Elsevier Inc.  

## 2018-02-26 LAB — URINALYSIS, MICROSCOPIC ONLY
Epithelial Cells (non renal): >10 /hpf — AB (ref 0–10)
WBC, UA: NONE SEEN /hpf (ref 0–5)

## 2018-02-26 LAB — URINE CULTURE

## 2018-02-26 LAB — VITAMIN D 25 HYDROXY (VIT D DEFICIENCY, FRACTURES): Vit D, 25-Hydroxy: 33.9 ng/mL (ref 30.0–100.0)

## 2018-04-09 ENCOUNTER — Ambulatory Visit: Payer: BLUE CROSS/BLUE SHIELD | Admitting: Family Medicine

## 2018-05-28 ENCOUNTER — Encounter: Payer: Self-pay | Admitting: Family Medicine

## 2018-05-28 ENCOUNTER — Other Ambulatory Visit: Payer: BLUE CROSS/BLUE SHIELD

## 2018-05-28 ENCOUNTER — Ambulatory Visit: Payer: BLUE CROSS/BLUE SHIELD | Admitting: Family Medicine

## 2018-05-28 VITALS — BP 118/82 | HR 61 | Temp 98.0°F | Ht 65.0 in | Wt 194.0 lb

## 2018-05-28 DIAGNOSIS — Z23 Encounter for immunization: Secondary | ICD-10-CM | POA: Diagnosis not present

## 2018-05-28 DIAGNOSIS — R350 Frequency of micturition: Secondary | ICD-10-CM | POA: Diagnosis not present

## 2018-05-28 DIAGNOSIS — R300 Dysuria: Secondary | ICD-10-CM

## 2018-05-28 LAB — POCT URINALYSIS DIPSTICK
Bilirubin, UA: NEGATIVE
Blood, UA: NEGATIVE
Glucose, UA: NEGATIVE
Ketones, UA: NEGATIVE
Nitrite, UA: NEGATIVE
Protein, UA: NEGATIVE
Spec Grav, UA: 1.015 (ref 1.010–1.025)
Urobilinogen, UA: 0.2 E.U./dL
pH, UA: 7 (ref 5.0–8.0)

## 2018-05-28 MED ORDER — NITROFURANTOIN MONOHYD MACRO 100 MG PO CAPS: 100.0000 mg | ORAL_CAPSULE | Freq: Two times a day (BID) | ORAL | 0 refills | Status: DC

## 2018-05-28 NOTE — Progress Notes (Signed)
Patient ID: Alexis Young, female   DOB: [birth year], 52 y.o.   MRN: [id number]    PCP: Binnie Rail, MD  Subjective:  Alexis Young is a 52 y.o. year old very pleasant female patient who presents with Urinary Tract symptoms: symptoms including urinary frequency, polyuria, nocturia, and urgency. -started:2 months ago , symptoms are not worsening  -previous treatments: Drinking more water to see if symptoms would improve She reports these symptoms are like she experienced previously when she saw her gynecologist who noted some urine abnormalities such as  "crystals" in her urine and she would like to have her urine "rechecked"  ROS-denies fever, chills, sweats, N/V, flank pain, blood in urine, polydipsia, polyuria, or polyphagia.  Pertinent Past Medical History- Prediabetes She has incorporated dietary changes as directed by PCP with A1C 5.7.   Medications- reviewed  Current Outpatient Medications  Medication Sig Dispense Refill  . estradiol (ESTRACE) 1 MG tablet TAKE 1 TABLET (1 MG TOTAL) BY MOUTH DAILY. 90 tablet 3  . levothyroxine (SYNTHROID, LEVOTHROID) 50 MCG tablet TAKE 1 TABLET BY MOUTH EVERY DAY 90 tablet 3  . Omega-3 Fatty Acids (FISH OIL PO) Take 645 mg by mouth daily.      No current facility-administered medications for this visit.     Objective: BP 118/82   Pulse 61   Temp 98 F (36.7 C) (Oral)   Ht 5\' 5"  (1.651 m)   Wt 194 lb (88 kg)   LMP 10/15/2014 (Exact Date)   SpO2 99%   BMI 32.28 kg/m  Gen: NAD, resting comfortably HEENT: Oropharynx is clear and moist CV: RRR no murmurs rubs or gallops Lungs: CTAB no crackles, wheeze, rhonchi Abdomen: soft/nontender/nondistended/normal bowel sounds. No rebound or guarding.  No CVA tenderness.   Suprapubic tenderness not present Ext: no edema Skin: warm, dry, no rash Neuro: grossly normal, moves all extremities  Assessment/Plan: 1. Dysuria  - nitrofurantoin, macrocrystal-monohydrate, (MACROBID) 100 MG capsule;  Take 1 capsule (100 mg total) by mouth 2 (two) times daily.  Dispense: 10 capsule; Refill: 0  2. Urinary frequency  - POCT Urinalysis Dipstick - Urine Culture; Future   UA indicates 3+ Leukocytes.  History and exam today are suggestive of UTI.  Will send for culture. Empiric treatment with nitrofurantoin 100 mg BID x 5 days.   Advised patient to complete antibiotic and she should follow up if her symptoms do not improve in 2 to 3 days, worsen, she develops a fever >101, or back pain. Also advised her to force fluids and she can use OTC Azo for symptom relief temporarily if needed. Patient voiced understanding and agreed with plan.  Finally, we reviewed reasons to return to care including if symptoms worsen or persist or new concerns arise- once again particularly fever, N/V, or flank pain.   Alexis Quint, FNP

## 2018-05-28 NOTE — Patient Instructions (Signed)
Please complete antibiotic as directed. If you develop symptoms of fever >101, pain in your back, or do not improve with treatment that has been provided in 3 to 4 days, please follow up for further evaluation and treatment.   Urinary Tract Infection, Adult A urinary tract infection (UTI) is an infection of any part of the urinary tract. The urinary tract includes the:  Kidneys.  Ureters.  Bladder.  Urethra.  These organs make, store, and get rid of pee (urine) in the body. Follow these instructions at home:  Take over-the-counter and prescription medicines only as told by your doctor.  If you were prescribed an antibiotic medicine, take it as told by your doctor. Do not stop taking the antibiotic even if you start to feel better.  Avoid the following drinks: ? Alcohol. ? Caffeine. ? Tea. ? Carbonated drinks.  Drink enough fluid to keep your pee clear or pale yellow.  Keep all follow-up visits as told by your doctor. This is important.  Make sure to: ? Empty your bladder often and completely. Do not to hold pee for long periods of time. ? Empty your bladder before and after sex. ? Wipe from front to back after a bowel movement if you are female. Use each tissue one time when you wipe. Contact a doctor if:  You have back pain.  You have a fever.  You feel sick to your stomach (nauseous).  You throw up (vomit).  Your symptoms do not get better after 3 days.  Your symptoms go away and then come back. Get help right away if:  You have very bad back pain.  You have very bad lower belly (abdominal) pain.  You are throwing up and cannot keep down any medicines or water. This information is not intended to replace advice given to you by your health care provider. Make sure you discuss any questions you have with your health care provider. Document Released: 01/01/2008 Document Revised: 12/21/2015 Document Reviewed: 06/05/2015 Elsevier Interactive Patient Education   [employer].

## 2018-05-29 LAB — URINE CULTURE
MICRO NUMBER:: 91311805
SPECIMEN QUALITY:: ADEQUATE

## 2018-06-01 ENCOUNTER — Telehealth: Payer: Self-pay

## 2018-06-01 NOTE — Telephone Encounter (Signed)
-----   Message from Delano Metz, [place] sent at 05/30/2018  8:50 AM EDT ----- Urine culture does not indicate an infection. If symptoms persist, please follow up with PCP for further evaluation.

## 2018-06-01 NOTE — Telephone Encounter (Signed)
Pt has viewed results via MyChart  

## 2018-06-17 ENCOUNTER — Encounter: Payer: Self-pay | Admitting: Internal Medicine

## 2018-06-17 ENCOUNTER — Ambulatory Visit: Payer: BLUE CROSS/BLUE SHIELD | Admitting: Internal Medicine

## 2018-06-17 ENCOUNTER — Other Ambulatory Visit (INDEPENDENT_AMBULATORY_CARE_PROVIDER_SITE_OTHER): Payer: BLUE CROSS/BLUE SHIELD

## 2018-06-17 VITALS — BP 146/82 | HR 62 | Temp 98.1°F | Resp 16 | Ht 65.0 in | Wt 192.0 lb

## 2018-06-17 DIAGNOSIS — Z0000 Encounter for general adult medical examination without abnormal findings: Secondary | ICD-10-CM | POA: Diagnosis not present

## 2018-06-17 DIAGNOSIS — R350 Frequency of micturition: Secondary | ICD-10-CM

## 2018-06-17 DIAGNOSIS — Z23 Encounter for immunization: Secondary | ICD-10-CM | POA: Diagnosis not present

## 2018-06-17 DIAGNOSIS — R7303 Prediabetes: Secondary | ICD-10-CM

## 2018-06-17 DIAGNOSIS — H6981 Other specified disorders of Eustachian tube, right ear: Secondary | ICD-10-CM | POA: Diagnosis not present

## 2018-06-17 DIAGNOSIS — E039 Hypothyroidism, unspecified: Secondary | ICD-10-CM

## 2018-06-17 LAB — LIPID PANEL
Cholesterol: 232 mg/dL — ABNORMAL HIGH (ref 0–200)
HDL: 51.7 mg/dL (ref >39.00)
LDL Cholesterol: 155 mg/dL — ABNORMAL HIGH (ref 0–99)
NonHDL: 179.95
Total CHOL/HDL Ratio: 4
Triglycerides: 123 mg/dL (ref 0.0–149.0)
VLDL: 24.6 mg/dL (ref 0.0–40.0)

## 2018-06-17 LAB — URINALYSIS, ROUTINE W REFLEX MICROSCOPIC
Bilirubin Urine: NEGATIVE
Ketones, ur: NEGATIVE
Leukocytes, UA: NEGATIVE
Nitrite: NEGATIVE
Specific Gravity, Urine: 1.01 (ref 1.000–1.030)
Total Protein, Urine: NEGATIVE
Urine Glucose: NEGATIVE
Urobilinogen, UA: 0.2 (ref 0.0–1.0)
WBC, UA: NONE SEEN (ref 0–?)
pH: 7 (ref 5.0–8.0)

## 2018-06-17 LAB — CBC WITH DIFFERENTIAL/PLATELET
Basophils Absolute: 0 10*3/uL (ref 0.0–0.1)
Basophils Relative: 0.2 % (ref 0.0–3.0)
Eosinophils Absolute: 0.1 10*3/uL (ref 0.0–0.7)
Eosinophils Relative: 1.7 % (ref 0.0–5.0)
HCT: 39.6 % (ref 36.0–46.0)
Hemoglobin: 13.2 g/dL (ref 12.0–15.0)
Lymphocytes Relative: 34.8 % (ref 12.0–46.0)
Lymphs Abs: 2.2 10*3/uL (ref 0.7–4.0)
MCHC: 33.4 g/dL (ref 30.0–36.0)
MCV: 85.1 fl (ref 78.0–100.0)
Monocytes Absolute: 0.4 10*3/uL (ref 0.1–1.0)
Monocytes Relative: 6.7 % (ref 3.0–12.0)
Neutro Abs: 3.6 10*3/uL (ref 1.4–7.7)
Neutrophils Relative %: 56.6 % (ref 43.0–77.0)
Platelets: 241 10*3/uL (ref 150.0–400.0)
RBC: 4.65 Mil/uL (ref 3.87–5.11)
RDW: 13.7 % (ref 11.5–15.5)
WBC: 6.3 10*3/uL (ref 4.0–10.5)

## 2018-06-17 LAB — COMPREHENSIVE METABOLIC PANEL
ALT: 13 U/L (ref 0–35)
AST: 16 U/L (ref 0–37)
Albumin: 4.4 g/dL (ref 3.5–5.2)
Alkaline Phosphatase: 66 U/L (ref 39–117)
BUN: 16 mg/dL (ref 6–23)
CO2: 27 mEq/L (ref 19–32)
Calcium: 9 mg/dL (ref 8.4–10.5)
Chloride: 104 mEq/L (ref 96–112)
Creatinine, Ser: 0.84 mg/dL (ref 0.40–1.20)
GFR: 75.25 mL/min (ref >60.00)
Glucose, Bld: 92 mg/dL (ref 70–99)
Potassium: 4 mEq/L (ref 3.5–5.1)
Sodium: 137 mEq/L (ref 135–145)
Total Bilirubin: 0.6 mg/dL (ref 0.2–1.2)
Total Protein: 7.4 g/dL (ref 6.0–8.3)

## 2018-06-17 LAB — HEMOGLOBIN A1C: Hgb A1c MFr Bld: 5.5 % (ref 4.6–6.5)

## 2018-06-17 LAB — TSH: TSH: 2.56 u[IU]/mL (ref 0.35–4.50)

## 2018-06-17 MED ORDER — PREDNISONE 20 MG PO TABS: 40.0000 mg | ORAL_TABLET | Freq: Every day | ORAL | 0 refills | Status: AC

## 2018-06-17 NOTE — Assessment & Plan Note (Signed)
Completed treatment for recent ? uti Still has frequent urination UA, UCx

## 2018-06-17 NOTE — Assessment & Plan Note (Signed)
Check a1c Low sugar / carb diet Stressed regular exercise   

## 2018-06-17 NOTE — Assessment & Plan Note (Signed)
Clinically euthyroid Check tsh  Titrate med dose if needed  

## 2018-06-17 NOTE — Assessment & Plan Note (Signed)
Has tried sudafed w/o improvement Has dec hearing and significant tinnitus Start prednisone 40 mg daily x 5 days

## 2018-06-17 NOTE — Progress Notes (Signed)
Subjective:    Patient ID: Alexis Young, female    DOB: Jan 08, 1966, 52 y.o.   MRN: [id number]  HPI She is here for a physical exam.    She had a cold a couple of weeks ago.  She started sudafed.  Cold cleared up, but right ear > left ear - has been persistently clogged, her hearing feels muffled and she has a constant sound in the right ear like a drilling sensation.   She can not sleep because the noise is so loud.    She denies other concerns.  She needs a TB test for work    Medications and allergies reviewed with patient and updated if appropriate.  Patient Active Problem List   Diagnosis Date Noted  . Hyperlipidemia 01/15/2018  . Prediabetes 01/15/2018  . Palpitations 01/15/2018  . Hypothyroidism 07/31/2016  . Lateral epicondylitis of right elbow 09/28/2015  . Status post laparoscopic hysterectomy 02/14/2015  . Fatigue 05/03/2014  . GERD (gastroesophageal reflux disease) 05/03/2014  . Vasomotor flushing 09/21/2013  . Hair loss 09/21/2013  . 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 estradiol (ESTRACE) 1 MG tablet TAKE 1 TABLET (1 MG TOTAL) BY MOUTH DAILY. 90 tablet 3  . levothyroxine (SYNTHROID, LEVOTHROID) 50 MCG tablet TAKE 1 TABLET BY MOUTH EVERY DAY 90 tablet 3  . Omega-3 Fatty Acids (FISH OIL PO) Take 645 mg by mouth daily.      No current facility-administered medications on file prior to visit.     Past Medical History:  Diagnosis Date  . Arthritis   . Cancer ([place]) 03/2013, 06/2013   malignant melanoma on right arm and back  . Dyspareunia   . Elevated serum creatinine   . Fibroid   . GERD (gastroesophageal reflux disease)    takes protonix as needed   . 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 Tennis elbow 2017   Right    Past Surgical History:  Procedure Laterality Date  . BILATERAL SALPINGECTOMY Bilateral 02/14/2015   Procedure:  BILATERAL SALPINGECTOMY OOPHORECTOMY;  Surgeon: Nunzio Cobbs, MD;  Location: [facility];  Service: Gynecology;  Laterality: Bilateral;  . BREAST BIOPSY  1987   Dr. Rosana Hoes  . BREAST EXCISIONAL BIOPSY Left    benign  . BREAST SURGERY Right 04/2010   breast biopsy for multiple fibroadenoma - no atypia  . CATARACT EXTRACTION, BILATERAL    . CESAREAN SECTION    . CRYOTHERAPY  5/91   for persistent condyloma atypia  . CYSTOSCOPY N/A 02/14/2015   Procedure: CYSTOSCOPY;  Surgeon: Nunzio Cobbs, MD;  Location: [facility];  Service: Gynecology;  Laterality: N/A;  . LASER ABLATION OF CONDYLOMAS  03/05/90 Dr. 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 MELANOMA EXCISION Right 03/2013   melanoma right arm  . ROBOTIC ASSISTED TOTAL HYSTERECTOMY N/A 02/14/2015   Procedure: ROBOTIC ASSISTED TOTAL HYSTERECTOMY;  Surgeon: Nunzio Cobbs, MD;  Location: [facility];  Service: Gynecology;  Laterality: N/A;  . SKIN CANCER EXCISION  06/2013   midddle of back with path report of atypia  . TUBAL LIGATION Bilateral   . WISDOM TOOTH EXTRACTION      Social History   Socioeconomic History  . Marital status: Married    Spouse name: Not on file  . Number of children: 1  . Years of  education: Not on file  . Highest education level: Not on file  Occupational History    Employer: [employer]  Social Needs  . Financial resource strain: Not on file  . Food insecurity:    Worry: Not on file    Inability: Not on file  . Transportation needs:    Medical: Not on file    Non-medical: Not on file  Tobacco Use  . Smoking status: Never Smoker  . Smokeless tobacco: Never Used  Substance and Sexual Activity  . Alcohol use: No  . Drug use: No  . Sexual activity: Yes    Partners: Male    Birth control/protection: Surgical    Comment: Hysterectomy  Lifestyle  . Physical activity:    Days per week: Not on file    Minutes per session: Not on file  . Stress: Not on file    Relationships  . Social connections:    Talks on phone: Not on file    Gets together: Not on file    Attends religious service: Not on file    Active member of club or organization: Not on file    Attends meetings of clubs or organizations: Not on file    Relationship status: Not on file  Other Topics Concern  . Not on file  Social History Narrative  . Not on file    Family History  Problem Relation Age of Onset  . Cancer Mother        lung  . Alcohol abuse Father        Cirrhosis of liver  . Cancer Maternal Aunt        stomach cancer  . Cancer Maternal Grandmother        uterine cancer  . Heart failure Paternal Grandmother     Review of Systems  Constitutional: Negative for chills and fever.  HENT: Negative for congestion, ear pain and sinus pain.        Right ear feels warm, bubbling/crackling, pressure and popping  Respiratory: Positive for cough (occ). Negative for shortness of breath and wheezing.   Cardiovascular: Negative for chest pain, palpitations and leg swelling.  Gastrointestinal: Positive for nausea (with ear symptoms). Negative for abdominal pain, blood in stool, constipation and diarrhea.       No gerd  Genitourinary: Positive for frequency. Negative for dysuria and hematuria.  Musculoskeletal: Negative for arthralgias and back pain.  Skin: Negative for color change and rash.  Neurological: Negative for dizziness, light-headedness and headaches.  Psychiatric/Behavioral: Negative for dysphoric mood. The patient is not nervous/anxious.        Objective:   Vitals:   06/17/18 1550  BP: (!) 146/82  Pulse: 62  Resp: 16  Temp: 98.1 F (36.7 C)  SpO2: 99%   Filed Weights   06/17/18 1550  Weight: 192 lb (87.1 kg)   Body mass index is 31.95 kg/m. BP Readings from Last 3 Encounters:  06/17/18 (!) 146/82  05/28/18 118/82  02/25/18 126/80     Wt Readings from Last 3 Encounters:  06/17/18 192 lb (87.1 kg)  05/28/18 194 lb (88 kg)  02/25/18 191  lb (86.6 kg)     Physical Exam Constitutional: She appears well-developed and well-nourished. No distress.  HENT:  Head: Normocephalic and atraumatic.  Right Ear: External ear normal. Normal ear canal and TM Left Ear: External ear normal.  Normal ear canal and TM Mouth/Throat: Oropharynx is clear and moist.  Eyes: Conjunctivae and EOM are normal.  Neck: Neck  supple. No tracheal deviation present. No thyromegaly present.  No carotid bruit  Cardiovascular: Normal rate, regular rhythm and normal heart sounds.   No murmur heard.  No edema. Pulmonary/Chest: Effort normal and breath sounds normal. No respiratory distress. She has no wheezes. She has no rales.  Breast: deferred to Gyn Abdominal: Soft. She exhibits no distension. There is no tenderness.  Lymphadenopathy: She has no cervical adenopathy.  Skin: Skin is warm and dry. She is not diaphoretic.  Psychiatric: She has a normal mood and affect. Her behavior is normal.        Assessment & Plan:   Physical exam: Screening blood work  ordered Immunizations  Up to date , discussed shingrix Colonoscopy   Up to date  Mammogram   Up to date  Gyn   Up to date  Exercise    walking Weight  encouraged weight loss Skin   none Substance abuse   none  See Problem List for Assessment and Plan of chronic medical problems.   FU in one year

## 2018-06-17 NOTE — Patient Instructions (Addendum)
Tests ordered today. Your results will be released to [facility] (or called to you) after review, usually within 72hours after test completion. If any changes need to be made, you will be notified at that same time.  All other Health Maintenance issues reviewed.   All recommended immunizations and age-appropriate screenings are up-to-date or discussed.  No immunizations administered today.   Your TB test was administered.    Medications reviewed and updated.  Changes include :   Prednisone for your ear   Please followup in one year   Health Maintenance, Female Adopting a healthy lifestyle and getting preventive care can go a long way to promote health and wellness. Talk with your health care provider about what schedule of regular examinations is right for you. This is a good chance for you to check in with your provider about disease prevention and staying healthy. In between checkups, there are plenty of things you can do on your own. Experts have done a lot of research about which lifestyle changes and preventive measures are most likely to keep you healthy. Ask your health care provider for more information. Weight and diet Eat a healthy diet  Be sure to include plenty of vegetables, fruits, low-fat dairy products, and lean protein.  Do not eat a lot of foods high in solid fats, added sugars, or salt.  Get regular exercise. This is one of the most important things you can do for your health. ? Most adults should exercise for at least 150 minutes each week. The exercise should increase your heart rate and make you sweat (moderate-intensity exercise). ? Most adults should also do strengthening exercises at least twice a week. This is in addition to the moderate-intensity exercise.  Maintain a healthy weight  Body mass index (BMI) is a measurement that can be used to identify possible weight problems. It estimates body fat based on height and weight. Your health care provider can help  determine your BMI and help you achieve or maintain a healthy weight.  For females 81 years of age and older: ? A BMI below 18.5 is considered underweight. ? A BMI of 18.5 to 24.9 is normal. ? A BMI of 25 to 29.9 is considered overweight. ? A BMI of 30 and above is considered obese.  Watch levels of cholesterol and blood lipids  You should start having your blood tested for lipids and cholesterol at 53 years of age, then have this test every 5 years.  You may need to have your cholesterol levels checked more often if: ? Your lipid or cholesterol levels are high. ? You are older than 53 years of age. ? You are at high risk for heart disease.  Cancer screening Lung Cancer  Lung cancer screening is recommended for adults 81-18 years old who are at high risk for lung cancer because of a history of smoking.  A yearly low-dose CT scan of the lungs is recommended for people who: ? Currently smoke. ? Have quit within the past 15 years. ? Have at least a 30-pack-year history of smoking. A pack year is smoking an average of one pack of cigarettes a day for 1 year.  Yearly screening should continue until it has been 15 years since you quit.  Yearly screening should stop if you develop a health problem that would prevent you from having lung cancer treatment.  Breast Cancer  Practice breast self-awareness. This means understanding how your breasts normally appear and feel.  It also means doing regular  breast self-exams. Let your health care provider know about any changes, no matter how small.  If you are in your 20s or 30s, you should have a clinical breast exam (CBE) by a health care provider every 1-3 years as part of a regular health exam.  If you are 39 or older, have a CBE every year. Also consider having a breast X-ray (mammogram) every year.  If you have a family history of breast cancer, talk to your health care provider about genetic screening.  If you are at high risk for  breast cancer, talk to your health care provider about having an MRI and a mammogram every year.  Breast cancer gene (BRCA) assessment is recommended for women who have family members with BRCA-related cancers. BRCA-related cancers include: ? Breast. ? Ovarian. ? Tubal. ? Peritoneal cancers.  Results of the assessment will determine the need for genetic counseling and BRCA1 and BRCA2 testing.  Cervical Cancer Your health care provider may recommend that you be screened regularly for cancer of the pelvic organs (ovaries, uterus, and vagina). This screening involves a pelvic examination, including checking for microscopic changes to the surface of your cervix (Pap test). You may be encouraged to have this screening done every 3 years, beginning at age 49.  For women ages 43-65, health care providers may recommend pelvic exams and Pap testing every 3 years, or they may recommend the Pap and pelvic exam, combined with testing for human papilloma virus (HPV), every 5 years. Some types of HPV increase your risk of cervical cancer. Testing for HPV may also be done on women of any age with unclear Pap test results.  Other health care providers may not recommend any screening for nonpregnant women who are considered low risk for pelvic cancer and who do not have symptoms. Ask your health care provider if a screening pelvic exam is right for you.  If you have had past treatment for cervical cancer or a condition that could lead to cancer, you need Pap tests and screening for cancer for at least 20 years after your treatment. If Pap tests have been discontinued, your risk factors (such as having a new sexual partner) need to be reassessed to determine if screening should resume. Some women have medical problems that increase the chance of getting cervical cancer. In these cases, your health care provider may recommend more frequent screening and Pap tests.  Colorectal Cancer  This type of cancer can be  detected and often prevented.  Routine colorectal cancer screening usually begins at 53 years of age and continues through 53 years of age.  Your health care provider may recommend screening at an earlier age if you have risk factors for colon cancer.  Your health care provider may also recommend using home test kits to check for hidden blood in the stool.  A small camera at the end of a tube can be used to examine your colon directly (sigmoidoscopy or colonoscopy). This is done to check for the earliest forms of colorectal cancer.  Routine screening usually begins at age 65.  Direct examination of the colon should be repeated every 5-10 years through 53 years of age. However, you may need to be screened more often if early forms of precancerous polyps or small growths are found.  Skin Cancer  Check your skin from head to toe regularly.  Tell your health care provider about any new moles or changes in moles, especially if there is a change in a mole's shape  or color.  Also tell your health care provider if you have a mole that is larger than the size of a pencil eraser.  Always use sunscreen. Apply sunscreen liberally and repeatedly throughout the day.  Protect yourself by wearing long sleeves, pants, a wide-brimmed hat, and sunglasses whenever you are outside.  Heart disease, diabetes, and high blood pressure  High blood pressure causes heart disease and increases the risk of stroke. High blood pressure is more likely to develop in: ? People who have blood pressure in the high end of the normal range (130-139/85-89 mm Hg). ? People who are overweight or obese. ? People who are African American.  If you are 18-59 years of age, have your blood pressure checked every 3-5 years. If you are 36 years of age or older, have your blood pressure checked every year. You should have your blood pressure measured twice-once when you are at a hospital or clinic, and once when you are not at a  hospital or clinic. Record the average of the two measurements. To check your blood pressure when you are not at a hospital or clinic, you can use: ? An automated blood pressure machine at a pharmacy. ? A home blood pressure monitor.  If you are between 37 years and 12 years old, ask your health care provider if you should take aspirin to prevent strokes.  Have regular diabetes screenings. This involves taking a blood sample to check your fasting blood sugar level. ? If you are at a normal weight and have a low risk for diabetes, have this test once every three years after 53 years of age. ? If you are overweight and have a high risk for diabetes, consider being tested at a younger age or more often. Preventing infection Hepatitis B  If you have a higher risk for hepatitis B, you should be screened for this virus. You are considered at high risk for hepatitis B if: ? You were born in a country where hepatitis B is common. Ask your health care provider which countries are considered high risk. ? Your parents were born in a high-risk country, and you have not been immunized against hepatitis B (hepatitis B vaccine). ? You have HIV or AIDS. ? You use needles to inject street drugs. ? You live with someone who has hepatitis B. ? You have had sex with someone who has hepatitis B. ? You get hemodialysis treatment. ? You take certain medicines for conditions, including cancer, organ transplantation, and autoimmune conditions.  Hepatitis C  Blood testing is recommended for: ? Everyone born from [birth year] through 1966. ? Anyone with known risk factors for hepatitis C.  Sexually transmitted infections (STIs)  You should be screened for sexually transmitted infections (STIs) including gonorrhea and chlamydia if: ? You are sexually active and are younger than 53 years of age. ? You are older than 53 years of age and your health care provider tells you that you are at risk for this type of  infection. ? Your sexual activity has changed since you were last screened and you are at an increased risk for chlamydia or gonorrhea. Ask your health care provider if you are at risk.  If you do not have HIV, but are at risk, it may be recommended that you take a prescription medicine daily to prevent HIV infection. This is called pre-exposure prophylaxis (PrEP). You are considered at risk if: ? You are sexually active and do not regularly use condoms or know the  HIV status of your partner(s). ? You take drugs by injection. ? You are sexually active with a partner who has HIV.  Talk with your health care provider about whether you are at high risk of being infected with HIV. If you choose to begin PrEP, you should first be tested for HIV. You should then be tested every 3 months for as long as you are taking PrEP. Pregnancy  If you are premenopausal and you may become pregnant, ask your health care provider about preconception counseling.  If you may become pregnant, take 400 to 800 micrograms (mcg) of folic acid every day.  If you want to prevent pregnancy, talk to your health care provider about birth control (contraception). Osteoporosis and menopause  Osteoporosis is a disease in which the bones lose minerals and strength with aging. This can result in serious bone fractures. Your risk for osteoporosis can be identified using a bone density scan.  If you are 78 years of age or older, or if you are at risk for osteoporosis and fractures, ask your health care provider if you should be screened.  Ask your health care provider whether you should take a calcium or vitamin D supplement to lower your risk for osteoporosis.  Menopause may have certain physical symptoms and risks.  Hormone replacement therapy may reduce some of these symptoms and risks. Talk to your health care provider about whether hormone replacement therapy is right for you. Follow these instructions at home:  Schedule  regular health, dental, and eye exams.  Stay current with your immunizations.  Do not use any tobacco products including cigarettes, chewing tobacco, or electronic cigarettes.  If you are pregnant, do not drink alcohol.  If you are breastfeeding, limit how much and how often you drink alcohol.  Limit alcohol intake to no more than 1 drink per day for nonpregnant women. One drink equals 12 ounces of beer, 5 ounces of wine, or 1 ounces of hard liquor.  Do not use street drugs.  Do not share needles.  Ask your health care provider for help if you need support or information about quitting drugs.  Tell your health care provider if you often feel depressed.  Tell your health care provider if you have ever been abused or do not feel safe at home. This information is not intended to replace advice given to you by your health care provider. Make sure you discuss any questions you have with your health care provider. Document Released: 01/28/2011 Document Revised: 12/21/2015 Document Reviewed: 04/18/2015 Elsevier Interactive Patient Education  [employer].

## 2018-06-18 LAB — URINE CULTURE
MICRO NUMBER:: 91399093
SPECIMEN QUALITY:: ADEQUATE

## 2018-06-19 ENCOUNTER — Other Ambulatory Visit: Payer: Self-pay | Admitting: Internal Medicine

## 2018-06-19 ENCOUNTER — Encounter: Payer: Self-pay | Admitting: Internal Medicine

## 2018-06-19 DIAGNOSIS — Z1231 Encounter for screening mammogram for malignant neoplasm of breast: Secondary | ICD-10-CM

## 2018-06-19 LAB — TB SKIN TEST
Induration: 0 mm
TB Skin Test: NEGATIVE

## 2018-06-22 ENCOUNTER — Encounter: Payer: Self-pay | Admitting: Internal Medicine

## 2018-06-23 ENCOUNTER — Encounter: Payer: Self-pay | Admitting: Internal Medicine

## 2018-06-28 ENCOUNTER — Encounter: Payer: Self-pay | Admitting: Internal Medicine

## 2018-06-29 ENCOUNTER — Encounter: Payer: Self-pay | Admitting: Internal Medicine

## 2018-06-30 ENCOUNTER — Encounter: Payer: BLUE CROSS/BLUE SHIELD | Admitting: Internal Medicine

## 2018-07-07 ENCOUNTER — Encounter: Payer: Self-pay | Admitting: Internal Medicine

## 2018-07-08 MED ORDER — AMOXICILLIN-POT CLAVULANATE 875-125 MG PO TABS: 1.0000 | ORAL_TABLET | Freq: Two times a day (BID) | ORAL | 0 refills | Status: DC

## 2018-07-14 ENCOUNTER — Encounter: Payer: Self-pay | Admitting: Internal Medicine

## 2018-07-16 ENCOUNTER — Telehealth: Payer: Self-pay | Admitting: Obstetrics and Gynecology

## 2018-07-16 NOTE — Telephone Encounter (Signed)
She could start with Aveeno oatmeal bath today which should help

## 2018-07-16 NOTE — Telephone Encounter (Signed)
Spoke with patient. Patient reports redness, burning and itching externally on vagina and back to rectum. Symptoms started on 12/17. Denies bleeding, vaginal d/c or odor. Currently taking abx amoxicillin for Sinus infection and URI, will complete medication on 12/21. Called PCP, OTC 1 day monistat recommended, provided no relief.   Recommended OV for further evaluation. Patient declined OV for today with Melvia Heaps, CNM and 12/20 at 9:30am with Dr. Quincy Simmonds. Patient request OV today after 3:30pm or tomorrow after 1:30pm.   OV scheduled for 12/20 at 3pm with Melvia Heaps, CNM.  Routing to provider for final review. Patient is agreeable to disposition. Will close encounter.  Cc: Melvia Heaps, CNM

## 2018-07-16 NOTE — Telephone Encounter (Signed)
Patient called and said she thinks she may have a yeast infection after taking some antibiotics. She took an OTC medication yesterday morning, she said, but is continuing to have symptoms. Routing to triage for review and scheduling with patient's physician, Dr. Quincy Simmonds, if possible.

## 2018-07-16 NOTE — Telephone Encounter (Signed)
Call to patient. Message given to patient as seen below from Debbi. Patient states she will try the Aveeno bath to help with her symptoms.

## 2018-07-17 ENCOUNTER — Encounter: Payer: Self-pay | Admitting: Certified Nurse Midwife

## 2018-07-17 ENCOUNTER — Ambulatory Visit: Payer: BLUE CROSS/BLUE SHIELD | Admitting: Certified Nurse Midwife

## 2018-07-17 ENCOUNTER — Other Ambulatory Visit: Payer: Self-pay

## 2018-07-17 VITALS — BP 120/80 | HR 70 | Resp 16 | Ht 64.75 in | Wt 193.0 lb

## 2018-07-17 DIAGNOSIS — T3695XA Adverse effect of unspecified systemic antibiotic, initial encounter: Secondary | ICD-10-CM | POA: Diagnosis not present

## 2018-07-17 DIAGNOSIS — B373 Candidiasis of vulva and vagina: Secondary | ICD-10-CM

## 2018-07-17 DIAGNOSIS — B3731 Acute candidiasis of vulva and vagina: Secondary | ICD-10-CM

## 2018-07-17 DIAGNOSIS — B379 Candidiasis, unspecified: Secondary | ICD-10-CM | POA: Diagnosis not present

## 2018-07-17 MED ORDER — NYSTATIN 100000 UNIT/GM EX OINT: 1.0000 "application " | TOPICAL_OINTMENT | Freq: Two times a day (BID) | CUTANEOUS | 0 refills | Status: DC

## 2018-07-17 MED ORDER — FLUCONAZOLE 150 MG PO TABS: ORAL_TABLET | ORAL | 0 refills | Status: DC

## 2018-07-17 NOTE — Progress Notes (Signed)
52 y.o. Married Caucasian female [id number] here with complaint of vaginal symptoms of itching, burning, and increase discharge. Describes discharge as white, thick, non odor. Patient was treated for URI with antibiotics and feels related to this. Has used Monistat OTC per PCP with no change. Itching is really uncomfortable. Onset of symptoms 4 days ago. Denies new personal products. Some vaginal dryness. Urinary symptoms none that are different.  No other health issues today  Review of Systems  Constitutional: Negative.   HENT: Negative.   Eyes: Negative.   Respiratory: Negative.   Cardiovascular: Negative.   Gastrointestinal: Negative.   Genitourinary:       Vaginal itching & burning  Musculoskeletal: Negative.   Skin: Negative.   Neurological: Negative.   Endo/Heme/Allergies: Negative.   Psychiatric/Behavioral: Negative.     O:Healthy female WDWN Affect: normal, orientation x 3  Exam:Skin: warm and dry Abdomen:Soft, non tender, no masses  Inguinal Lymph nodes: no enlargement or tenderness Pelvic exam: External genital: normal female with scaling and exudate noted BUS: negative Vagina: copious white slight odorous  discharge noted. Ph:4.0   ,Wet prep taken,  Cervix: absent Uterus:absent Adnexa: no masses or fullness noted Rectal area: no redness or exudate   Wet Prep results: KOH + yeast, Saline+ yeast   A:Normal pelvic exam History of URI treated with Augmentin Yeast vaginitis/vulvitis   P:Discussed findings of yeast vaginitis/vulvitis and etiology. Discussed Aveeno  sitz bath for comfort. Avoid moist clothes or pads for extended period of time.   Coconut Oil use for skin protection  can be used to external skin for protection or dryness. Questions addressed. Discussed starting on oral refrigerated probiotic to help with GI flora change. Rx: Diflucan 150 mg see order with instructions Rx : Nystatin see order with instructions  Rv prn, or if symptoms increase.

## 2018-07-29 DIAGNOSIS — H9319 Tinnitus, unspecified ear: Secondary | ICD-10-CM

## 2018-07-29 HISTORY — DX: Tinnitus, unspecified ear: H93.19

## 2018-08-04 ENCOUNTER — Ambulatory Visit
Admission: RE | Admit: 2018-08-04 | Discharge: 2018-08-04 | Disposition: A | Discharge status: Home / Self Care | Payer: BLUE CROSS/BLUE SHIELD | Source: Ambulatory Visit | Attending: Internal Medicine | Admitting: Internal Medicine

## 2018-08-04 DIAGNOSIS — Z1231 Encounter for screening mammogram for malignant neoplasm of breast: Secondary | ICD-10-CM

## 2019-01-01 ENCOUNTER — Other Ambulatory Visit: Payer: Self-pay | Admitting: Internal Medicine

## 2019-03-05 ENCOUNTER — Ambulatory Visit: Payer: BLUE CROSS/BLUE SHIELD | Admitting: Obstetrics and Gynecology

## 2019-03-12 DIAGNOSIS — H90A22 Sensorineural hearing loss, unilateral, left ear, with restricted hearing on the contralateral side: Secondary | ICD-10-CM | POA: Insufficient documentation

## 2019-03-12 DIAGNOSIS — H9311 Tinnitus, right ear: Secondary | ICD-10-CM | POA: Insufficient documentation

## 2019-04-01 ENCOUNTER — Other Ambulatory Visit: Payer: Self-pay

## 2019-04-01 MED ORDER — ESTRADIOL 1 MG PO TABS: ORAL_TABLET | ORAL | 0 refills | Status: DC

## 2019-04-01 NOTE — Telephone Encounter (Signed)
Medication refill request: Estradiol Last AEX:  02/25/2018 BS Next AEX: 05/06/2019 Last MMG (if hormonal medication request): 08/04/2018 BIRADS 1 Negative Density C Refill authorized: Pending authorization. #[id number] with no refills if appropriate. Please advise.

## 2019-04-24 ENCOUNTER — Other Ambulatory Visit: Payer: Self-pay | Admitting: Obstetrics and Gynecology

## 2019-04-26 ENCOUNTER — Other Ambulatory Visit: Payer: Self-pay

## 2019-04-26 NOTE — Progress Notes (Signed)
53 y.o. G7P1001 Married Caucasian female here for annual exam.    Patient would like to have thyroid studies checked today. Feeling tired all the time.  Not sleeping well. Her husband gets up at 2:30 to get ready for work.   Having right tinnitus and hearing loss.  Now has a tube in her right ear.   Taking ERT.  Does feel hot at times.  Urinary frequency.  No burning.  Drinks a lot of water.  Up a couple times a night to void.  DF - every 2 hours.  No urinary incontinence. Can leak a little with sneeze or cough.   Returning to work next week during the pandemic.  Urine Dip: Trace RBCs  PCP: Billey Gosling, MD    Patient's last menstrual period was 10/15/2014 (exact date).           Sexually active: Yes.    The current method of family planning is status post hysterectomy.    Exercising: Yes.    walking Smoker:  no  Health Maintenance: Pap: 12-20-14 Neg:Neg HR HPV, 08-29-11 Neg:Neg HR HPV History of abnormal Pap:  Yes, Hx cryotherapy to cervix years ago. Final surgical pathology of cervix--benign. MMG: 08-04-18 3D/Neg/density C/BiRads1 Colonoscopy: 03/22/16,Normal, repeat in 10 years BMD:   n/a  Result  n/a TDaP:  12-13-16 Gardasil:   n/a HIV:01-10-16 NR Hep C:Unsure.   Screening Labs:  ---    reports that she has never smoked. She has never used smokeless tobacco. She reports that she does not drink alcohol or use drugs.  Past Medical History:  Diagnosis Date  . Arthritis   . Cancer ([place]) 03/2013, 06/2013   malignant melanoma on right arm and back  . Dyspareunia   . Elevated serum creatinine   . Fibroid   . GERD (gastroesophageal reflux disease)    takes protonix as needed   . 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 Tennis elbow 2017   Right  . Tinnitus 2020   both ears    Past Surgical History:  Procedure Laterality Date  . BILATERAL SALPINGECTOMY Bilateral 02/14/2015   Procedure: BILATERAL SALPINGECTOMY OOPHORECTOMY;  Surgeon: Nunzio Cobbs, MD;  Location: [facility];  Service: Gynecology;  Laterality: Bilateral;  . BREAST BIOPSY  1987   Dr. Rosana Hoes  . BREAST EXCISIONAL BIOPSY Left    benign  . BREAST SURGERY Right 04/2010   breast biopsy for multiple fibroadenoma - no atypia  . CATARACT EXTRACTION, BILATERAL    . CESAREAN SECTION    . CRYOTHERAPY  5/91   for persistent condyloma atypia  . CYSTOSCOPY N/A 02/14/2015   Procedure: CYSTOSCOPY;  Surgeon: Nunzio Cobbs, MD;  Location: [facility];  Service: Gynecology;  Laterality: N/A;  . LASER ABLATION OF CONDYLOMAS  03/05/90 Dr. 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 MELANOMA EXCISION Right 03/2013   melanoma right arm  . ROBOTIC ASSISTED TOTAL HYSTERECTOMY N/A 02/14/2015   Procedure: ROBOTIC ASSISTED TOTAL HYSTERECTOMY;  Surgeon: Nunzio Cobbs, MD;  Location: [facility];  Service: Gynecology;  Laterality: N/A;  . SKIN CANCER EXCISION  06/2013   midddle of back with path report of atypia  . TUBAL LIGATION Bilateral   . WISDOM TOOTH EXTRACTION      Current Outpatient Medications  Medication Sig Dispense Refill  . estradiol (ESTRACE) 1 MG tablet TAKE 1 TABLET (1 MG TOTAL) BY MOUTH DAILY. 30 tablet 0  .  fluticasone (FLONASE) 50 MCG/ACT nasal spray Place 1 spray into both nostrils daily.    Marland Kitchen levothyroxine (SYNTHROID) 50 MCG tablet TAKE 1 TABLET BY MOUTH EVERY DAY 90 tablet 1  . VITAMIN D PO Take by mouth.     No current facility-administered medications for this visit.     Family History  Problem Relation Age of Onset  . Cancer Mother        lung  . Alcohol abuse Father        Cirrhosis of liver  . Cancer Maternal Aunt        stomach cancer  . Cancer Maternal Grandmother        uterine cancer  . Heart failure Paternal Grandmother   . Breast cancer Neg Hx     Review of Systems  Genitourinary: Positive for frequency.  All other systems reviewed and are negative.   Exam:   BP 122/78 (Cuff Size: Large)   Pulse 66   Temp 97.7  F (36.5 C) (Temporal)   Resp 18   Ht 5\' 5"  (1.651 m)   Wt 194 lb 6.4 oz (88.2 kg)   LMP 10/15/2014 (Exact Date)   BMI 32.35 kg/m     General appearance: alert, cooperative and appears stated age Head: normocephalic, without obvious abnormality, atraumatic Neck: no adenopathy, supple, symmetrical, trachea midline and thyroid normal to inspection and palpation Lungs: clear to auscultation bilaterally Breasts: normal appearance, no masses or tenderness, No nipple retraction or dimpling, No nipple discharge or bleeding, No axillary adenopathy Heart: regular rate and rhythm Abdomen: soft, non-tender; no masses, no organomegaly Extremities: extremities normal, atraumatic, no cyanosis or edema Skin: skin color, texture, turgor normal. No rashes or lesions Lymph nodes: cervical, supraclavicular, and axillary nodes normal. Neurologic: grossly normal  Pelvic: External genitalia:  no lesions              No abnormal inguinal nodes palpated.              Urethra:  normal appearing urethra with no masses, tenderness or lesions              Bartholins and Skenes: normal                 Vagina: normal appearing vagina with normal color and discharge, no lesions              Cervix: absent              Pap taken: No. Bimanual Exam:  Uterus:  absent              Adnexa: no mass, fullness, tenderness              Rectal exam: Yes.  .  Confirms.              Anus:  normal sphincter tone, no lesions  Chaperone was present for exam.  Assessment:   Well woman visit with normal exam. Status post robotic hysterectomy and BSO. Hx premature ovarian failure since age 58.  On ERT. Hx melanoma. Hx hypothyroidism.  Managed by PCP.  Elevated LDL cholesterol. Fatigue.  Urinary incontinence - mild mixed incontinence. Urinary frequency.   Plan: Mammogram screening discussed. Self breast awareness reviewed. Pap and HR HPV as above. Guidelines for Calcium, Vitamin D, regular exercise program including  cardiovascular and weight bearing exercise. Refill of ERT.  I discussed WHI and increased risk of stroke, PE, DVT, and possible breast cancer.  Urine micro and cx.  Declines medication for over active bladder.  TFTs, CBC, hep c aby. BMD for next year.  I recommend flu vaccine.  She will see dermatology this week.  Follow up annually and prn.   After visit summary provided.

## 2019-04-27 ENCOUNTER — Encounter: Payer: Self-pay | Admitting: Obstetrics and Gynecology

## 2019-04-27 ENCOUNTER — Ambulatory Visit (INDEPENDENT_AMBULATORY_CARE_PROVIDER_SITE_OTHER): Payer: BC Managed Care – PPO | Admitting: Obstetrics and Gynecology

## 2019-04-27 VITALS — BP 122/78 | HR 66 | Temp 97.7°F | Resp 18 | Ht 65.0 in | Wt 194.4 lb

## 2019-04-27 DIAGNOSIS — R350 Frequency of micturition: Secondary | ICD-10-CM | POA: Diagnosis not present

## 2019-04-27 DIAGNOSIS — Z119 Encounter for screening for infectious and parasitic diseases, unspecified: Secondary | ICD-10-CM

## 2019-04-27 DIAGNOSIS — R319 Hematuria, unspecified: Secondary | ICD-10-CM

## 2019-04-27 DIAGNOSIS — Z01419 Encounter for gynecological examination (general) (routine) without abnormal findings: Secondary | ICD-10-CM

## 2019-04-27 DIAGNOSIS — E28319 Asymptomatic premature menopause: Secondary | ICD-10-CM | POA: Diagnosis not present

## 2019-04-27 LAB — POCT URINALYSIS DIPSTICK
Bilirubin, UA: NEGATIVE
Glucose, UA: NEGATIVE
Ketones, UA: NEGATIVE
Leukocytes, UA: NEGATIVE
Nitrite, UA: NEGATIVE
Protein, UA: NEGATIVE
Urobilinogen, UA: 0.2 E.U./dL
pH, UA: 5 (ref 5.0–8.0)

## 2019-04-27 MED ORDER — ESTRADIOL 1 MG PO TABS: ORAL_TABLET | ORAL | 3 refills | Status: DC

## 2019-04-27 NOTE — Patient Instructions (Signed)

## 2019-04-28 LAB — TSH: TSH: 2.86 u[IU]/mL (ref 0.450–4.500)

## 2019-04-28 LAB — T4, FREE: Free T4: 1.13 ng/dL (ref 0.82–1.77)

## 2019-04-28 LAB — CBC
Hematocrit: 40.1 % (ref 34.0–46.6)
Hemoglobin: 13.1 g/dL (ref 11.1–15.9)
MCH: 27.9 pg (ref 26.6–33.0)
MCHC: 32.7 g/dL (ref 31.5–35.7)
MCV: 86 fL (ref 79–97)
Platelets: 248 10*3/uL (ref 150–450)
RBC: 4.69 x10E6/uL (ref 3.77–5.28)
RDW: 13.2 % (ref 11.7–15.4)
WBC: 5.1 10*3/uL (ref 3.4–10.8)

## 2019-04-28 LAB — URINE CULTURE

## 2019-04-28 LAB — HEPATITIS C ANTIBODY: Hep C Virus Ab: <0.1 s/co ratio (ref 0.0–0.9)

## 2019-04-29 LAB — URINALYSIS, MICROSCOPIC ONLY
Epithelial Cells (non renal): >10 /hpf — AB (ref 0–10)
RBC, Urine: NONE SEEN /hpf (ref 0–2)

## 2019-05-06 ENCOUNTER — Ambulatory Visit: Payer: BLUE CROSS/BLUE SHIELD | Admitting: Obstetrics and Gynecology

## 2019-05-12 ENCOUNTER — Other Ambulatory Visit: Payer: Self-pay | Admitting: Obstetrics and Gynecology

## 2019-05-12 DIAGNOSIS — Z1231 Encounter for screening mammogram for malignant neoplasm of breast: Secondary | ICD-10-CM

## 2019-06-28 NOTE — Progress Notes (Signed)
Subjective:    Patient ID: Alexis Young, female    DOB: [birth year], 53 y.o.   MRN: [id number]  HPI She is here for a physical exam.   She is fatigued.  She gets approximately 4 hours of sleep a night.  Her husband wakes up early and she wakes up when he gets up to go to work and typically does not get back to sleep.  She also does not sleep well because he has significant tinnitus-ringing in her ear and the sound of a heartbeat in her right ear.  Most likely this is related to conductive hearing loss.  She does follow with ENT and has a tube in the right ear.     Medications and allergies reviewed with patient and updated if appropriate.  Patient Active Problem List   Diagnosis Date Noted  . Sleep difficulties 06/29/2019  . 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 Tinnitus of right ear 03/12/2019  . Dysfunction of right eustachian tube 06/17/2018  . Hyperlipidemia 01/15/2018  . Prediabetes 01/15/2018  . Palpitations 01/15/2018  . Hypothyroidism 07/31/2016  . Status post laparoscopic hysterectomy 02/14/2015  . Fatigue 05/03/2014  . 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 estradiol (ESTRACE) 1 MG tablet TAKE 1 TABLET (1 MG TOTAL) BY MOUTH DAILY. 90 tablet 3  . fluticasone (FLONASE) 50 MCG/ACT nasal spray Place 1 spray into both nostrils daily.    Marland Kitchen levothyroxine (SYNTHROID) 50 MCG tablet TAKE 1 TABLET BY MOUTH EVERY DAY 90 tablet 1  . VITAMIN D PO Take by mouth.    . tretinoin (RETIN-A) 0.025 % cream APPLY A PEA SIZE AMOUNT TO AFFECTED AREAS NIGHTLY.     No current facility-administered medications on file prior to visit.     Past Medical History:  Diagnosis Date  . Arthritis   . Cancer ([place]) 03/2013, 06/2013   malignant melanoma on right arm and back  . Dyspareunia   . Elevated serum creatinine   . Fibroid   . GERD  (gastroesophageal reflux disease)    takes protonix as needed   . 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 Tennis elbow 2017   Right  . Tinnitus 2020   both ears    Past Surgical History:  Procedure Laterality Date  . BILATERAL SALPINGECTOMY Bilateral 02/14/2015   Procedure: BILATERAL SALPINGECTOMY OOPHORECTOMY;  Surgeon: Nunzio Cobbs, MD;  Location: [facility];  Service: Gynecology;  Laterality: Bilateral;  . BREAST BIOPSY  1987   Dr. Rosana Hoes  . BREAST EXCISIONAL BIOPSY Left    benign  . BREAST SURGERY Right 04/2010   breast biopsy for multiple fibroadenoma - no atypia  . CATARACT EXTRACTION, BILATERAL    . CESAREAN SECTION    . CRYOTHERAPY  5/91   for persistent condyloma atypia  . CYSTOSCOPY N/A 02/14/2015   Procedure: CYSTOSCOPY;  Surgeon: Nunzio Cobbs, MD;  Location: [facility];  Service: Gynecology;  Laterality: N/A;  . LASER ABLATION OF CONDYLOMAS  03/05/90 Dr. 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 MELANOMA EXCISION Right 03/2013   melanoma right arm  . ROBOTIC ASSISTED TOTAL HYSTERECTOMY N/A 02/14/2015   Procedure: ROBOTIC ASSISTED TOTAL HYSTERECTOMY;  Surgeon: Nunzio Cobbs, MD;  Location: [facility];  Service: Gynecology;  Laterality: N/A;  .  SKIN CANCER EXCISION  06/2013   midddle of back with path report of atypia  . TUBAL LIGATION Bilateral   . WISDOM TOOTH EXTRACTION      Social History   Socioeconomic History  . Marital status: Married    Spouse name: Not on file  . Number of children: 1  . Years of education: Not on file  . Highest education level: Not on file  Occupational History    Employer: [employer]  Social Needs  . Financial resource strain: Not on file  . Food insecurity    Worry: Not on file    Inability: Not on file  . Transportation needs    Medical: Not on file    Non-medical: Not on file  Tobacco Use  . Smoking status: Never Smoker  . Smokeless tobacco: Never Used   Substance and Sexual Activity  . Alcohol use: No  . Drug use: No  . Sexual activity: Yes    Partners: Male    Birth control/protection: Surgical    Comment: Hysterectomy  Lifestyle  . Physical activity    Days per week: Not on file    Minutes per session: Not on file  . Stress: Not on file  Relationships  . Social Herbalist on phone: Not on file    Gets together: Not on file    Attends religious service: Not on file    Active member of club or organization: Not on file    Attends meetings of clubs or organizations: Not on file    Relationship status: Not on file  Other Topics Concern  . Not on file  Social History Narrative  . Not on file    Family History  Problem Relation Age of Onset  . Cancer Mother        lung  . Alcohol abuse Father        Cirrhosis of liver  . Cancer Maternal Aunt        stomach cancer  . Cancer Maternal Grandmother        uterine cancer  . Heart failure Paternal Grandmother   . Breast cancer Neg Hx     Review of Systems  Constitutional: Positive for fatigue. Negative for chills and fever.  Eyes: Positive for visual disturbance (right eye only - related to cataract surgery).  Respiratory: Negative for cough, shortness of breath and wheezing.   Cardiovascular: Positive for leg swelling (right ankle only). Negative for chest pain and palpitations.  Gastrointestinal: Negative for abdominal pain, blood in stool, constipation, diarrhea and nausea.       Occ gerd  Genitourinary: Negative for dysuria and hematuria.  Musculoskeletal: Positive for arthralgias (right ankle, fingers) and joint swelling (right ankle - hurts sometimes).  Skin: Negative for color change and rash.  Neurological: Positive for light-headedness (from right ear issue). Negative for headaches.  Psychiatric/Behavioral: Positive for sleep disturbance. Negative for dysphoric mood. The patient is not nervous/anxious.        Objective:   Vitals:   06/29/19 1447   BP: 134/82  Pulse: 64  Resp: 16  Temp: 97.9 F (36.6 C)  SpO2: 99%   Filed Weights   06/29/19 1447  Weight: 193 lb (87.5 kg)   Body mass index is 32.12 kg/m.  BP Readings from Last 3 Encounters:  06/29/19 134/82  04/27/19 122/78  07/17/18 120/80    Wt Readings from Last 3 Encounters:  06/29/19 193 lb (87.5 kg)  04/27/19 194 lb 6.4  oz (88.2 kg)  07/17/18 193 lb (87.5 kg)     Physical Exam Constitutional: She appears well-developed and well-nourished. No distress.  HENT:  Head: Normocephalic and atraumatic.  Right Ear: External ear normal. Normal ear canal.  Tube in right TM without drainage Left Ear: External ear normal.  Normal ear canal and TM Mouth/Throat: Oropharynx is clear and moist.  Eyes: Conjunctivae and EOM are normal.  Neck: Neck supple. No tracheal deviation present. No thyromegaly present. No carotid bruit  Cardiovascular: Normal rate, regular rhythm and normal heart sounds.  No murmur heard.  No edema. Pulmonary/Chest: Effort normal and breath sounds normal. No respiratory distress. She has no wheezes. She has no rales.  Breast: deferred   Abdominal: Soft. She exhibits no distension. There is no tenderness.  Lymphadenopathy: She has no cervical adenopathy.  Skin: Skin is warm and dry. She is not diaphoretic.  Psychiatric: She has a normal mood and affect. Her behavior is normal.        Assessment & Plan:   Physical exam: Screening blood work    ordered Immunizations  Discussed shingrix, others up to date Colonoscopy  Up to date  Mammogram  Up to date  Gyn  Up to date  Eye exams  Up to date  Exercise  none Weight  encouraged weight loss Substance abuse   none  See Problem List for Assessment and Plan of chronic medical problems.   FU in one year

## 2019-06-28 NOTE — Patient Instructions (Addendum)
Tests ordered today. Your results will be released to [place] (or called to you) after review.  If any changes need to be made, you will be notified at that same time.  All other Health Maintenance issues reviewed.   All recommended immunizations and age-appropriate screenings are up-to-date or discussed.  No immunization administered today.   Medications reviewed and updated.  Changes include :   Trial of trazodone for sleep.   Your prescription(s) have been submitted to your pharmacy. Please take as directed and contact our office if you believe you are having problem(s) with the medication(s).  Please followup in 1 year    Health Maintenance, Female Adopting a healthy lifestyle and getting preventive care are important in promoting health and wellness. Ask your health care provider about:  The right schedule for you to have regular tests and exams.  Things you can do on your own to prevent diseases and keep yourself healthy. What should I know about diet, weight, and exercise? Eat a healthy diet   Eat a diet that includes plenty of vegetables, fruits, low-fat dairy products, and lean protein.  Do not eat a lot of foods that are high in solid fats, added sugars, or sodium. Maintain a healthy weight Body mass index (BMI) is used to identify weight problems. It estimates body fat based on height and weight. Your health care provider can help determine your BMI and help you achieve or maintain a healthy weight. Get regular exercise Get regular exercise. This is one of the most important things you can do for your health. Most adults should:  Exercise for at least 150 minutes each week. The exercise should increase your heart rate and make you sweat (moderate-intensity exercise).  Do strengthening exercises at least twice a week. This is in addition to the moderate-intensity exercise.  Spend less time sitting. Even light physical activity can be beneficial. Watch cholesterol and  blood lipids Have your blood tested for lipids and cholesterol at 54 years of age, then have this test every 5 years. Have your cholesterol levels checked more often if:  Your lipid or cholesterol levels are high.  You are older than 54 years of age.  You are at high risk for heart disease. What should I know about cancer screening? Depending on your health history and family history, you may need to have cancer screening at various ages. This may include screening for:  Breast cancer.  Cervical cancer.  Colorectal cancer.  Skin cancer.  Lung cancer. What should I know about heart disease, diabetes, and high blood pressure? Blood pressure and heart disease  High blood pressure causes heart disease and increases the risk of stroke. This is more likely to develop in people who have high blood pressure readings, are of African descent, or are overweight.  Have your blood pressure checked: ? Every 3-5 years if you are 60-48 years of age. ? Every year if you are 50 years old or older. Diabetes Have regular diabetes screenings. This checks your fasting blood sugar level. Have the screening done:  Once every three years after age 30 if you are at a normal weight and have a low risk for diabetes.  More often and at a younger age if you are overweight or have a high risk for diabetes. What should I know about preventing infection? Hepatitis B If you have a higher risk for hepatitis B, you should be screened for this virus. Talk with your health care provider to find out if  you are at risk for hepatitis B infection. Hepatitis C Testing is recommended for:  Everyone born from [birth year] through 1966.  Anyone with known risk factors for hepatitis C. Sexually transmitted infections (STIs)  Get screened for STIs, including gonorrhea and chlamydia, if: ? You are sexually active and are younger than 54 years of age. ? You are older than 54 years of age and your health care provider tells  you that you are at risk for this type of infection. ? Your sexual activity has changed since you were last screened, and you are at increased risk for chlamydia or gonorrhea. Ask your health care provider if you are at risk.  Ask your health care provider about whether you are at high risk for HIV. Your health care provider may recommend a prescription medicine to help prevent HIV infection. If you choose to take medicine to prevent HIV, you should first get tested for HIV. You should then be tested every 3 months for as long as you are taking the medicine. Pregnancy  If you are about to stop having your period (premenopausal) and you may become pregnant, seek counseling before you get pregnant.  Take 400 to 800 micrograms (mcg) of folic acid every day if you become pregnant.  Ask for birth control (contraception) if you want to prevent pregnancy. Osteoporosis and menopause Osteoporosis is a disease in which the bones lose minerals and strength with aging. This can result in bone fractures. If you are 59 years old or older, or if you are at risk for osteoporosis and fractures, ask your health care provider if you should:  Be screened for bone loss.  Take a calcium or vitamin D supplement to lower your risk of fractures.  Be given hormone replacement therapy (HRT) to treat symptoms of menopause. Follow these instructions at home: Lifestyle  Do not use any products that contain nicotine or tobacco, such as cigarettes, e-cigarettes, and chewing tobacco. If you need help quitting, ask your health care provider.  Do not use street drugs.  Do not share needles.  Ask your health care provider for help if you need support or information about quitting drugs. Alcohol use  Do not drink alcohol if: ? Your health care provider tells you not to drink. ? You are pregnant, may be pregnant, or are planning to become pregnant.  If you drink alcohol: ? Limit how much you use to 0-1 drink a day. ?  Limit intake if you are breastfeeding.  Be aware of how much alcohol is in your drink. In the [place] one drink equals one 12 oz bottle of beer (355 mL), one 5 oz glass of wine (148 mL), or one 1 oz glass of hard liquor (44 mL). General instructions  Schedule regular health, dental, and eye exams.  Stay current with your vaccines.  Tell your health care provider if: ? You often feel depressed. ? You have ever been abused or do not feel safe at home. Summary  Adopting a healthy lifestyle and getting preventive care are important in promoting health and wellness.  Follow your health care provider's instructions about healthy diet, exercising, and getting tested or screened for diseases.  Follow your health care provider's instructions on monitoring your cholesterol and blood pressure. This information is not intended to replace advice given to you by your health care provider. Make sure you discuss any questions you have with your health care provider. Document Released: 01/28/2011 Document Revised: 07/08/2018 Document Reviewed: 07/08/2018 Elsevier Patient Education  2020 [employer]  

## 2019-06-29 ENCOUNTER — Other Ambulatory Visit (INDEPENDENT_AMBULATORY_CARE_PROVIDER_SITE_OTHER): Payer: BC Managed Care – PPO

## 2019-06-29 ENCOUNTER — Ambulatory Visit (INDEPENDENT_AMBULATORY_CARE_PROVIDER_SITE_OTHER): Payer: BC Managed Care – PPO | Admitting: Internal Medicine

## 2019-06-29 ENCOUNTER — Encounter: Payer: Self-pay | Admitting: Internal Medicine

## 2019-06-29 ENCOUNTER — Other Ambulatory Visit: Payer: Self-pay

## 2019-06-29 VITALS — BP 134/82 | HR 64 | Temp 97.9°F | Resp 16 | Ht 65.0 in | Wt 193.0 lb

## 2019-06-29 DIAGNOSIS — R7303 Prediabetes: Secondary | ICD-10-CM

## 2019-06-29 DIAGNOSIS — H9311 Tinnitus, right ear: Secondary | ICD-10-CM

## 2019-06-29 DIAGNOSIS — Z0000 Encounter for general adult medical examination without abnormal findings: Secondary | ICD-10-CM

## 2019-06-29 DIAGNOSIS — E782 Mixed hyperlipidemia: Secondary | ICD-10-CM | POA: Diagnosis not present

## 2019-06-29 DIAGNOSIS — G479 Sleep disorder, unspecified: Secondary | ICD-10-CM | POA: Insufficient documentation

## 2019-06-29 DIAGNOSIS — E039 Hypothyroidism, unspecified: Secondary | ICD-10-CM | POA: Diagnosis not present

## 2019-06-29 LAB — CBC WITH DIFFERENTIAL/PLATELET
Basophils Absolute: 0.1 10*3/uL (ref 0.0–0.1)
Basophils Relative: 1.1 % (ref 0.0–3.0)
Eosinophils Absolute: 0.1 10*3/uL (ref 0.0–0.7)
Eosinophils Relative: 2 % (ref 0.0–5.0)
HCT: 38.9 % (ref 36.0–46.0)
Hemoglobin: 12.8 g/dL (ref 12.0–15.0)
Lymphocytes Relative: 34.5 % (ref 12.0–46.0)
Lymphs Abs: 2 10*3/uL (ref 0.7–4.0)
MCHC: 32.9 g/dL (ref 30.0–36.0)
MCV: 85.8 fl (ref 78.0–100.0)
Monocytes Absolute: 0.4 10*3/uL (ref 0.1–1.0)
Monocytes Relative: 6.5 % (ref 3.0–12.0)
Neutro Abs: 3.2 10*3/uL (ref 1.4–7.7)
Neutrophils Relative %: 55.9 % (ref 43.0–77.0)
Platelets: 221 10*3/uL (ref 150.0–400.0)
RBC: 4.53 Mil/uL (ref 3.87–5.11)
RDW: 13.3 % (ref 11.5–15.5)
WBC: 5.7 10*3/uL (ref 4.0–10.5)

## 2019-06-29 LAB — COMPREHENSIVE METABOLIC PANEL
ALT: 12 U/L (ref 0–35)
AST: 14 U/L (ref 0–37)
Albumin: 4.2 g/dL (ref 3.5–5.2)
Alkaline Phosphatase: 75 U/L (ref 39–117)
BUN: 17 mg/dL (ref 6–23)
CO2: 28 mEq/L (ref 19–32)
Calcium: 9.1 mg/dL (ref 8.4–10.5)
Chloride: 104 mEq/L (ref 96–112)
Creatinine, Ser: 0.78 mg/dL (ref 0.40–1.20)
GFR: 76.82 mL/min (ref >60.00)
Glucose, Bld: 92 mg/dL (ref 70–99)
Potassium: 3.8 mEq/L (ref 3.5–5.1)
Sodium: 137 mEq/L (ref 135–145)
Total Bilirubin: 0.4 mg/dL (ref 0.2–1.2)
Total Protein: 7.2 g/dL (ref 6.0–8.3)

## 2019-06-29 LAB — LIPID PANEL
Cholesterol: 261 mg/dL — ABNORMAL HIGH (ref 0–200)
HDL: 53.3 mg/dL (ref >39.00)
NonHDL: 207.95
Total CHOL/HDL Ratio: 5
Triglycerides: 206 mg/dL — ABNORMAL HIGH (ref 0.0–149.0)
VLDL: 41.2 mg/dL — ABNORMAL HIGH (ref 0.0–40.0)

## 2019-06-29 LAB — TSH: TSH: 4.39 u[IU]/mL (ref 0.35–4.50)

## 2019-06-29 LAB — LDL CHOLESTEROL, DIRECT: Direct LDL: 167 mg/dL

## 2019-06-29 LAB — HEMOGLOBIN A1C: Hgb A1c MFr Bld: 5.5 % (ref 4.6–6.5)

## 2019-06-29 MED ORDER — TRAZODONE HCL 50 MG PO TABS: 50.0000 mg | ORAL_TABLET | Freq: Every evening | ORAL | 3 refills | Status: DC | PRN

## 2019-06-29 NOTE — Assessment & Plan Note (Signed)
Check a1c Low sugar / carb diet Stressed regular exercise   

## 2019-06-29 NOTE — Assessment & Plan Note (Addendum)
Not sleeping well due to her husband getting up early in her not being able to go to sleep again Severe tinnitus in the right ear also contributing We will do trial of trazodone 50 mg - 100 mg at bedtime She will update me if this is effective and with any questions

## 2019-06-29 NOTE — Assessment & Plan Note (Signed)
Severe tinnitus of the right ear with ringing and heartbeat sound likely related to conductive hearing loss Following with ENT

## 2019-06-29 NOTE — Assessment & Plan Note (Signed)
Check lipid panel  Regular exercise and healthy diet encouraged  

## 2019-06-29 NOTE — Assessment & Plan Note (Signed)
Clinically euthyroid Check tsh  Titrate med dose if needed  

## 2019-07-01 ENCOUNTER — Encounter: Payer: Self-pay | Admitting: Internal Medicine

## 2019-07-08 ENCOUNTER — Other Ambulatory Visit: Payer: Self-pay | Admitting: Internal Medicine

## 2019-07-24 ENCOUNTER — Other Ambulatory Visit: Payer: Self-pay | Admitting: Internal Medicine

## 2019-07-30 HISTORY — PX: BREAST LUMPECTOMY: SHX2

## 2019-07-30 HISTORY — PX: BREAST BIOPSY: SHX20

## 2019-08-06 ENCOUNTER — Other Ambulatory Visit: Payer: BLUE CROSS/BLUE SHIELD

## 2019-08-06 ENCOUNTER — Ambulatory Visit: Payer: BLUE CROSS/BLUE SHIELD

## 2019-08-12 ENCOUNTER — Ambulatory Visit (INDEPENDENT_AMBULATORY_CARE_PROVIDER_SITE_OTHER): Payer: BC Managed Care – PPO

## 2019-08-12 ENCOUNTER — Encounter: Payer: Self-pay | Admitting: Podiatry

## 2019-08-12 ENCOUNTER — Other Ambulatory Visit: Payer: Self-pay

## 2019-08-12 ENCOUNTER — Ambulatory Visit: Payer: BC Managed Care – PPO | Admitting: Podiatry

## 2019-08-12 VITALS — BP 137/76 | HR 61 | Resp 16

## 2019-08-12 DIAGNOSIS — M2011 Hallux valgus (acquired), right foot: Secondary | ICD-10-CM | POA: Diagnosis not present

## 2019-08-12 NOTE — Progress Notes (Signed)
Subjective:  Patient ID: Alexis Young, female    DOB: 06-02-1966,  MRN: [id number] HPI Chief Complaint  Patient presents with  . Foot Pain    1st MPJ right - aching x years intermittently, worsening, Dr. Blenda Mounts made inserts, but now more irritating, also lateral ankle swelling right   . New Patient (Initial Visit)    Est pt 2    54 y.o. female presents with the above complaint.   ROS: Denies fever chills nausea vomiting muscle aches pains calf pain back pain chest pain shortness of breath.  Past Medical History:  Diagnosis Date  . Arthritis   . Cancer ([place]) 03/2013, 06/2013   malignant melanoma on right arm and back  . Dyspareunia   . Elevated serum creatinine   . Fibroid   . GERD (gastroesophageal reflux disease)    takes protonix as needed   . 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 Tennis elbow 2017   Right  . 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 BILATERAL SALPINGECTOMY Bilateral 02/14/2015   Procedure: BILATERAL SALPINGECTOMY OOPHORECTOMY;  Surgeon: Nunzio Cobbs, MD;  Location: [facility];  Service: Gynecology;  Laterality: Bilateral;  . BREAST BIOPSY  1987   Dr. Rosana Hoes  . BREAST EXCISIONAL BIOPSY Left    benign  . BREAST SURGERY Right 04/2010   breast biopsy for multiple fibroadenoma - no atypia  . CATARACT EXTRACTION, BILATERAL    . CESAREAN SECTION    . CRYOTHERAPY  5/91   for persistent condyloma atypia  . CYSTOSCOPY N/A 02/14/2015   Procedure: CYSTOSCOPY;  Surgeon: Nunzio Cobbs, MD;  Location: [facility];  Service: Gynecology;  Laterality: N/A;  . LASER ABLATION OF CONDYLOMAS  03/05/90 Dr. 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 MELANOMA EXCISION Right 03/2013   melanoma right arm  . ROBOTIC ASSISTED TOTAL HYSTERECTOMY N/A 02/14/2015   Procedure: ROBOTIC ASSISTED TOTAL HYSTERECTOMY;  Surgeon: Nunzio Cobbs, MD;  Location: [facility];  Service: Gynecology;   Laterality: N/A;  . SKIN CANCER EXCISION  06/2013   midddle of back with path report of atypia  . TUBAL LIGATION Bilateral   . WISDOM TOOTH EXTRACTION      Current Outpatient Medications:  .  estradiol (ESTRACE) 1 MG tablet, TAKE 1 TABLET (1 MG TOTAL) BY MOUTH DAILY., Disp: 90 tablet, Rfl: 3 .  fluticasone (FLONASE) 50 MCG/ACT nasal spray, Place 1 spray into both nostrils daily., Disp: , Rfl:  .  levothyroxine (SYNTHROID) 50 MCG tablet, TAKE 1 TABLET BY MOUTH EVERY DAY, Disp: 90 tablet, Rfl: 1 .  traZODone (DESYREL) 50 MG tablet, TAKE 1-2 TABLETS (50-100 MG TOTAL) BY MOUTH AT BEDTIME AS NEEDED FOR SLEEP., Disp: 180 tablet, Rfl: 1 .  tretinoin (RETIN-A) 0.025 % cream, APPLY A PEA SIZE AMOUNT TO AFFECTED AREAS NIGHTLY., Disp: , Rfl:  .  VITAMIN D PO, Take by mouth., Disp: , Rfl:   Allergies  Allergen Reactions  . Tetracyclines & Related Nausea Only  . Asa [Aspirin] Nausea And Vomiting and Rash  . Sulfa Antibiotics Rash   Review of Systems Objective:   Vitals:   08/12/19 0909  BP: 137/76  Pulse: 61  Resp: 16    General: Well developed, nourished, in no acute distress, alert and oriented x3   Dermatological: Skin is warm, dry and supple bilateral. Nails x 10 are well  maintained; remaining integument appears unremarkable at this time. There are no open sores, no preulcerative lesions, no rash or signs of infection present.  Vascular: Dorsalis Pedis artery and Posterior Tibial artery pedal pulses are 2/4 bilateral with immedate capillary fill time. Pedal hair growth present. No varicosities and no lower extremity edema present bilateral.   Neruologic: Grossly intact via light touch bilateral. Vibratory intact via tuning fork bilateral. Protective threshold with Semmes Wienstein monofilament intact to all pedal sites bilateral. Patellar and Achilles deep tendon reflexes 2+ bilateral. No Babinski or clonus noted bilateral.   Musculoskeletal: No gross boney pedal deformities bilateral.  No pain, crepitus, or limitation noted with foot and ankle range of motion bilateral. Muscular strength 5/5 in all groups tested bilateral.  Gait: Unassisted, Nonantalgic.    Radiographs:  Radiographs taken today demonstrate an increase in the first intermetatarsal angle of the right foot and an osseously mature individual the first intermetatarsal angles probably about 15 degrees.  Hallux abductus angle is definitely greater than normal value with early dislocation laterally.  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  At this point we consented her for a [facility] bunion repair with screw fixation.  She understands this is amendable to it she is understands that she is going to be off her foot for period of time.  We did discuss the possible postop complications which may include but not limited to postop pain bleeding swelling infection recurrence need for further surgery overcorrection under correction loss of digit loss of limb loss of life.  We dispensed a Cam walker today and information regarding the surgery center as well as the anesthesia group and instructions for the morning of surgery.     Lurline Caver T. [place], [place]

## 2019-08-12 NOTE — Patient Instructions (Signed)
Pre-Operative Instructions  Congratulations, you have decided to take an important step towards improving your quality of life.  You can be assured that the doctors and staff at [facility] will be with you every step of the way.  Here are some important things you should know:  1. Plan to be at the surgery center/hospital at least 1 (one) hour prior to your scheduled time, unless otherwise directed by the surgical center/hospital staff.  You must have a responsible adult accompany you, remain during the surgery and drive you home.  Make sure you have directions to the surgical center/hospital to ensure you arrive on time. 2. If you are having surgery at [facility] or [facility], you will need a copy of your medical history and physical form from your family physician within one month prior to the date of surgery. We will give you a form for your primary physician to complete.  3. We make every effort to accommodate the date you request for surgery.  However, there are times where surgery dates or times have to be moved.  We will contact you as soon as possible if a change in schedule is required.   4. No aspirin/ibuprofen for one week before surgery.  If you are on aspirin, any non-steroidal anti-inflammatory medications (Mobic, Aleve, Ibuprofen) should not be taken seven (7) days prior to your surgery.  You make take Tylenol for pain prior to surgery.  5. Medications - If you are taking daily heart and blood pressure medications, seizure, reflux, allergy, asthma, anxiety, pain or diabetes medications, make sure you notify the surgery center/hospital before the day of surgery so they can tell you which medications you should take or avoid the day of surgery. 6. No food or drink after midnight the night before surgery unless directed otherwise by surgical center/hospital staff. 7. No alcoholic beverages 24-hours prior to surgery.  No smoking 24-hours prior or 24-hours after  surgery. 8. Wear loose pants or shorts. They should be loose enough to fit over bandages, boots, and casts. 9. Don't wear slip-on shoes. Sneakers are preferred. 10. Bring your boot with you to the surgery center/hospital.  Also bring crutches or a walker if your physician has prescribed it for you.  If you do not have this equipment, it will be provided for you after surgery. 11. If you have not been contacted by the surgery center/hospital by the day before your surgery, call to confirm the date and time of your surgery. 12. Leave-time from work may vary depending on the type of surgery you have.  Appropriate arrangements should be made prior to surgery with your employer. 13. Prescriptions will be provided immediately following surgery by your doctor.  Fill these as soon as possible after surgery and take the medication as directed. Pain medications will not be refilled on weekends and must be approved by the doctor. 14. Remove nail polish on the operative foot and avoid getting pedicures prior to surgery. 15. Wash the night before surgery.  The night before surgery wash the foot and leg well with water and the antibacterial soap provided. Be sure to pay special attention to beneath the toenails and in between the toes.  Wash for at least three (3) minutes. Rinse thoroughly with water and dry well with a towel.  Perform this wash unless told not to do so by your physician.  Enclosed: 1 Ice pack (please put in freezer the night before surgery)   1 Hibiclens skin cleaner     Pre-op instructions  If you have any questions regarding the instructions, please do not hesitate to call our office.  [place]: [address], [place], [place] [address] -- [phone]  [place]: [address] , [place] [address] -- [phone]  [place]: [address], [place], [place] [address] -- [phone]   Website: [email or web address] 

## 2019-08-13 ENCOUNTER — Telehealth: Payer: Self-pay | Admitting: Podiatry

## 2019-08-13 NOTE — Telephone Encounter (Signed)
Pt called to schedule surgery after seeing Dr. Milinda Pointer yesterday. I told her I could get her scheduled for Friday, 29 January. I told her the recovery time would be 6-8 weeks and pt stated she wanted to get her sx done in the colder months due to having to wear the boot. Pt wanted to know if she would have to sleep in the boot, when she would be able to wear a shoe, and how long it would be before she could drive. I told her those are things Dr. Milinda Pointer would go over with her at her postop visits but to ask the day of surgery if she needed to sleep in the boot. I told her normally for the first week especially we like the pt to stay in the boot even while sleeping incase they have to get up during the night. Told her she should only be on the foot for 15 minutes every hour and to keep it elevated to reduce postoperative swelling. Pt asked what time her surgery would be and I told her sometime that morning, that someone from the surgical center would call a day or two prior to let her know her surgery time and what time to arrive for pre-op. I told her to go ahead and register online with the surgical center and I would send her signed consent forms to them. Told her to call me with any questions prior to her surgery.

## 2019-08-18 ENCOUNTER — Telehealth: Payer: Self-pay | Admitting: Podiatry

## 2019-08-18 NOTE — Telephone Encounter (Signed)
Pt called and said she is cancelling sx right now because its just not a good time for her to have sx right now. Stated Dr. Milinda Pointer told her the signed consent forms were good for six months so she might reschedule it in that time frame. Told me to call her back with any questions.  I have cancelled her postop appointments and I've also cancelled her sx with Caren Griffins at [facility]. I have also cancelled her sx on Dr. Stephenie Acres schedule in both Epic and the surgery book.

## 2019-08-18 NOTE — Telephone Encounter (Signed)
When was Alexis Young scheduled.

## 2019-08-18 NOTE — Telephone Encounter (Signed)
For next Friday. I apologize, I thought I had put the date in there.

## 2019-08-18 NOTE — Telephone Encounter (Signed)
I'm scheduled for sx on 01/29 with Dr. Milinda Pointer. I need to cancel that sx. Please call me back. Thank you.

## 2019-09-02 ENCOUNTER — Encounter: Payer: BC Managed Care – PPO | Admitting: Podiatry

## 2019-09-14 ENCOUNTER — Encounter: Payer: BC Managed Care – PPO | Admitting: Podiatry

## 2019-09-28 ENCOUNTER — Encounter: Payer: BC Managed Care – PPO | Admitting: Podiatry

## 2019-10-12 ENCOUNTER — Encounter: Payer: BC Managed Care – PPO | Admitting: Podiatry

## 2019-10-14 ENCOUNTER — Other Ambulatory Visit: Payer: Self-pay

## 2019-10-14 ENCOUNTER — Encounter: Payer: Self-pay | Admitting: Internal Medicine

## 2019-10-14 ENCOUNTER — Ambulatory Visit
Admission: RE | Admit: 2019-10-14 | Discharge: 2019-10-14 | Disposition: A | Discharge status: Home / Self Care | Payer: BC Managed Care – PPO | Source: Ambulatory Visit | Attending: Obstetrics and Gynecology | Admitting: Obstetrics and Gynecology

## 2019-10-14 DIAGNOSIS — E28319 Asymptomatic premature menopause: Secondary | ICD-10-CM

## 2019-10-14 DIAGNOSIS — Z1231 Encounter for screening mammogram for malignant neoplasm of breast: Secondary | ICD-10-CM

## 2019-10-15 ENCOUNTER — Other Ambulatory Visit: Payer: Self-pay | Admitting: Obstetrics and Gynecology

## 2019-10-15 DIAGNOSIS — R928 Other abnormal and inconclusive findings on diagnostic imaging of breast: Secondary | ICD-10-CM

## 2019-10-18 ENCOUNTER — Encounter: Payer: Self-pay | Admitting: Certified Nurse Midwife

## 2019-10-27 ENCOUNTER — Other Ambulatory Visit: Payer: Self-pay

## 2019-10-27 ENCOUNTER — Ambulatory Visit
Admission: RE | Admit: 2019-10-27 | Discharge: 2019-10-27 | Disposition: A | Discharge status: Home / Self Care | Payer: BC Managed Care – PPO | Source: Ambulatory Visit | Attending: Obstetrics and Gynecology | Admitting: Obstetrics and Gynecology

## 2019-10-27 ENCOUNTER — Other Ambulatory Visit: Payer: Self-pay | Admitting: Obstetrics and Gynecology

## 2019-10-27 DIAGNOSIS — N6489 Other specified disorders of breast: Secondary | ICD-10-CM

## 2019-10-27 DIAGNOSIS — R928 Other abnormal and inconclusive findings on diagnostic imaging of breast: Secondary | ICD-10-CM

## 2019-11-01 ENCOUNTER — Ambulatory Visit
Admission: RE | Admit: 2019-11-01 | Discharge: 2019-11-01 | Disposition: A | Discharge status: Home / Self Care | Payer: BC Managed Care – PPO | Source: Ambulatory Visit | Attending: Obstetrics and Gynecology | Admitting: Obstetrics and Gynecology

## 2019-11-01 ENCOUNTER — Other Ambulatory Visit: Payer: Self-pay

## 2019-11-01 DIAGNOSIS — N6489 Other specified disorders of breast: Secondary | ICD-10-CM

## 2019-11-02 ENCOUNTER — Other Ambulatory Visit: Payer: BC Managed Care – PPO

## 2019-11-02 DIAGNOSIS — C50919 Malignant neoplasm of unspecified site of unspecified female breast: Secondary | ICD-10-CM

## 2019-11-02 HISTORY — DX: Malignant neoplasm of unspecified site of unspecified female breast: C50.919

## 2019-11-04 ENCOUNTER — Encounter: Payer: Self-pay | Admitting: *Deleted

## 2019-11-04 DIAGNOSIS — Z170 Estrogen receptor positive status [ER+]: Secondary | ICD-10-CM

## 2019-11-04 DIAGNOSIS — C50411 Malignant neoplasm of upper-outer quadrant of right female breast: Secondary | ICD-10-CM

## 2019-11-09 NOTE — Progress Notes (Signed)
Alexis Young  [phone] [phone]     ID: Alexis Young DOB: Jun 01, 1966  MR#: [id number]  AUQ#:[id number]  Patient Care Team: Binnie Rail, MD as PCP - General (Internal Medicine) Mauro Kaufmann, RN as Oncology Nurse Navigator Rockwell Germany, RN as Oncology Nurse Navigator Jemuel Laursen, Virgie Dad, MD as Consulting Physician (Oncology) Gery Pray, MD as Consulting Physician (Radiation Oncology) Erroll Luna, MD as Consulting Physician (General Surgery) Yisroel Ramming, Everardo All, MD as Consulting Physician (Obstetrics and Gynecology) Garrel Ridgel, [place] as Consulting Physician (Podiatry) Harriett Sine, MD as Consulting Physician (Dermatology) Juanita Craver, MD as Consulting Physician (Gastroenterology) Chauncey Cruel, MD OTHER MD:  CHIEF COMPLAINT: Estrogen receptor positive breast cancer  CURRENT TREATMENT: Definitive surgery pending   HISTORY OF CURRENT ILLNESS: Tennelle N Fishbaugh had routine screening mammography on 10/14/2019 showing a possible abnormality in the bilateral breasts. She underwent bilateral diagnostic mammography with tomography and bilateral breast ultrasonography at [facility] on 10/27/2019 showing: breast density category C; small area of asymmetry with pronounced focal distortion in outer right breast with no ultrasound correlate; benign left breast cysts; no abnormal-appearing lymph nodes.  Accordingly on 11/01/2019 she proceeded to biopsy of the right breast area in question. The pathology from this procedure ([id number]) showed: invasive lobular carcinoma, grade 1, e-cadherin negative. Prognostic indicators significant for: estrogen receptor, 80% positive and progesterone receptor, 80% positive, both with strong staining intensity. Proliferation marker Ki67 at 2%. HER2 negative by immunohistochemistry (0).  The patient's subsequent history is as detailed below.   INTERVAL HISTORY: Mikal was evaluated in the  multidisciplinary breast cancer clinic on 11/10/2019 accompanied by her husband Richardson Landry. Her case was also presented at the multidisciplinary breast cancer conference on the same day. At that time a preliminary plan was proposed: Breast MRI, likely breast conserving surgery with sentinel lymph node sampling, adjuvant radiation, Oncotype, and antiestrogens.   REVIEW OF SYSTEMS: There were no specific symptoms leading to the original mammogram, which was routinely scheduled. On the provided questionnaire, she reports wearing glasses, history of posterior vitreous detachment, hearing loss, hematuria, history of skin cancer, arthritis, and thyroid issues. The patient denies unusual headaches, nausea, vomiting, stiff neck, dizziness, or gait imbalance. There has been no cough, phlegm production, or pleurisy, no chest pain or pressure, and no change in bowel  habits. The patient denies fever, rash, bleeding, unexplained fatigue or unexplained weight loss. A detailed review of systems was otherwise entirely negative.   PAST MEDICAL HISTORY: Past Medical History:  Diagnosis Date  . Arthritis   . Cancer ([place]) 03/2013, 06/2013   malignant melanoma on right arm and back  . Dyspareunia   . Elevated serum creatinine   . Family history of colon cancer   . Family history of lung cancer   . Family history of melanoma   . Family history of pancreatic cancer   . Family history of stomach cancer   . Family history of uterine cancer   . Fibroid   . GERD (gastroesophageal reflux disease)    takes protonix as needed   . 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 Tennis elbow 2017   Right  . Tinnitus 2020   both ears    PAST SURGICAL HISTORY: Past Surgical History:  Procedure Laterality Date  . BILATERAL SALPINGECTOMY Bilateral 02/14/2015   Procedure: BILATERAL SALPINGECTOMY OOPHORECTOMY;  Surgeon: Nunzio Cobbs, MD;  Location: [facility];  Service: Gynecology;  Laterality:  Bilateral;  . BREAST BIOPSY  1987   Dr. Rosana Hoes  . BREAST EXCISIONAL BIOPSY Left    benign  . BREAST SURGERY Right 04/2010   breast biopsy for multiple fibroadenoma - no atypia  . CATARACT EXTRACTION, BILATERAL    . CESAREAN SECTION    . CRYOTHERAPY  5/91   for persistent condyloma atypia  . CYSTOSCOPY N/A 02/14/2015   Procedure: CYSTOSCOPY;  Surgeon: Nunzio Cobbs, MD;  Location: [facility];  Service: Gynecology;  Laterality: N/A;  . LASER ABLATION OF CONDYLOMAS  03/05/90 Dr. 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 MELANOMA EXCISION Right 03/2013   melanoma right arm  . ROBOTIC ASSISTED TOTAL HYSTERECTOMY N/A 02/14/2015   Procedure: ROBOTIC ASSISTED TOTAL HYSTERECTOMY;  Surgeon: Nunzio Cobbs, MD;  Location: [facility];  Service: Gynecology;  Laterality: N/A;  . SKIN CANCER EXCISION  06/2013   midddle of back with path report of atypia  . TUBAL LIGATION Bilateral   . WISDOM TOOTH EXTRACTION      FAMILY HISTORY: Family History  Problem Relation Age of Onset  . Cancer Mother        lung and liver  . Alcohol abuse Father        Cirrhosis of liver  . Cancer Maternal Aunt        stomach cancer  . Cancer Maternal Grandmother        uterine cancer  . Heart failure Paternal Grandmother   . Lung cancer Maternal Uncle   . Lung cancer Maternal Uncle   . Pancreatic cancer Paternal Uncle   . Testicular cancer Cousin   . Melanoma Cousin        of eye  . Cancer Cousin        possible colon? dx 55s  . Colon cancer Other   . Breast cancer Neg Hx   Her father died at age 5 from alcoholism. Her mother was diagnosed with lung cancer at age 64 and died at age 64. Melek is an only child. She reports uterine cancer in her maternal grandmother at age 12, stomach cancer in a maternal aunt at age 64, lung cancer in two maternal uncles, and colon cancer in a maternal great-aunt.   GYNECOLOGIC HISTORY:  Patient's last menstrual period was 10/15/2014 (exact date). Menarche: 54  years old Age at first live birth: 54 years old [place] P 1 LMP: 2009 Contraceptive: used for 14 years without issue HRT used for 12 years  Hysterectomy? Yes, 01/2015, benign pathology BSO? 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 Husband Richardson Landry is a Musician.  Son Cristie Hem, age 40, is a Film/video editor.     ADVANCED DIRECTIVES: In the absence of any documentation to the contrary, the patient's spouse is their HCPOA.    HEALTH MAINTENANCE: Social History   Tobacco Use  . Smoking status: Never Smoker  . Smokeless tobacco: Never Used  Substance Use Topics  . Alcohol use: No  . Drug use: No     Colonoscopy: 02/2016, repeat 2027  PAP: 03/2019, negative  Bone density: 09/2019, +0.3   Allergies  Allergen Reactions  . Tetracyclines & Related Nausea Only  . Asa [Aspirin] Nausea And Vomiting and Rash  . Sulfa Antibiotics Rash    Current Outpatient Medications  Medication Sig Dispense Refill  . estradiol (ESTRACE) 1 MG tablet TAKE 1 TABLET (1  MG TOTAL) BY MOUTH DAILY. 90 tablet 3  . levothyroxine (SYNTHROID) 50 MCG tablet TAKE 1 TABLET BY MOUTH EVERY DAY 90 tablet 1  . tretinoin (RETIN-A) 0.025 % cream APPLY A PEA SIZE AMOUNT TO AFFECTED AREAS NIGHTLY.    Marland Kitchen VITAMIN D PO Take by mouth.    Marland Kitchen LORazepam (ATIVAN) 0.5 MG tablet Take 1 tablet (0.5 mg total) by mouth once for 1 dose. 2 tablet 0   No current facility-administered medications for this visit.    OBJECTIVE: White woman in no acute distress  Vitals:   11/10/19 0833  BP: (!) 153/58  Pulse: 62  Resp: 18  Temp: 98.5 F (36.9 C)  SpO2: 99%     Body mass index is 31.38 kg/m.   Wt Readings from Last 3 Encounters:  11/10/19 188 lb 9.6 oz (85.5 kg)  06/29/19 193 lb (87.5 kg)  04/27/19 194 lb 6.4 oz (88.2 kg)      ECOG FS:1 - Symptomatic but completely ambulatory  Ocular: Sclerae unicteric, pupils round and equal Ear-nose-throat:  Wearing a mask Lymphatic: No cervical or supraclavicular adenopathy Lungs no rales or rhonchi Heart regular rate and rhythm Abd soft, nontender, positive bowel sounds MSK no focal spinal tenderness, no joint edema Neuro: non-focal, well-oriented, appropriate affect Breasts: The right breast is status post recent biopsy.  There is a minimal ecchymosis.  Left breast is benign.  Both axillae are benign.   LAB RESULTS:  CMP     Component Value Date/Time   NA 140 11/10/2019 0812   NA 142 01/13/2017 1024   K 4.1 11/10/2019 0812   CL 107 11/10/2019 0812   CO2 27 11/10/2019 0812   GLUCOSE 95 11/10/2019 0812   BUN 14 11/10/2019 0812   BUN 14 01/13/2017 1024   CREATININE 0.92 11/10/2019 0812   CREATININE 0.94 01/10/2016 1001   CALCIUM 9.1 11/10/2019 0812   PROT 7.5 11/10/2019 0812   PROT 6.8 01/13/2017 1024   ALBUMIN 4.0 11/10/2019 0812   ALBUMIN 4.5 01/13/2017 1024   AST 12 (L) 11/10/2019 0812   ALT 10 11/10/2019 0812   ALKPHOS 73 11/10/2019 0812   BILITOT 0.6 11/10/2019 0812   GFRNONAA >60 11/10/2019 0812   GFRAA >60 11/10/2019 0812    No results found for: TOTALPROTELP, ALBUMINELP, A1GS, A2GS, BETS, BETA2SER, GAMS, MSPIKE, SPEI  Lab Results  Component Value Date   WBC 5.1 11/10/2019   NEUTROABS 2.9 11/10/2019   HGB 13.5 11/10/2019   HCT 42.0 11/10/2019   MCV 88.6 11/10/2019   PLT 224 11/10/2019    No results found for: LABCA2  No components found for: [id number]  No results for input(s): INR in the last 168 hours.  No results found for: LABCA2  No results found for: IPJ825  No results found for: KNL976  No results found for: BHA193  No results found for: CA2729  No components found for: HGQUANT  No results found for: CEA1 / No results found for: CEA1   No results found for: AFPTUMOR  No results found for: CHROMOGRNA  No results found for: KPAFRELGTCHN, LAMBDASER, KAPLAMBRATIO (kappa/lambda light chains)  No results found for: HGBA, HGBA2QUANT,  HGBFQUANT, HGBSQUAN (Hemoglobinopathy evaluation)   No results found for: LDH  No results found for: IRON, TIBC, IRONPCTSAT (Iron and TIBC)  No results found for: FERRITIN  Urinalysis    Component Value Date/Time   COLORURINE YELLOW 06/17/2018 [place] 06/17/2018 1649   LABSPEC 1.010 06/17/2018 1649   PHURINE 7.0  06/17/2018 1649   GLUCOSEU NEGATIVE 06/17/2018 1649   HGBUR TRACE-INTACT (A) 06/17/2018 1649   BILIRUBINUR n 04/27/2019 0913   KETONESUR NEGATIVE 06/17/2018 1649   PROTEINUR Negative 04/27/2019 0913   UROBILINOGEN 0.2 04/27/2019 0913   UROBILINOGEN 0.2 06/17/2018 1649   NITRITE n 04/27/2019 0913   NITRITE NEGATIVE 06/17/2018 1649   LEUKOCYTESUR Negative 04/27/2019 0913     STUDIES: DG Bone Density  Result Date: 10/14/2019 EXAM: DUAL X-RAY ABSORPTIOMETRY (DXA) FOR BONE MINERAL DENSITY IMPRESSION: Referring Physician:  Nunzio Cobbs Your patient completed a BMD test using Lunar IDXA DXA system ( analysis version: 16 ) manufactured by [employer]. Technologist: WLS PATIENT: Name: Armanii, Urbanik Patient ID: [id number] Birth Date: 03/16/1966 Height: 65.0 in. Sex: Female Measured: 10/14/2019 Weight: 191.0 lbs. Indications: Bilateral Ovariectomy (65.51), Caucasian, Estrogen Deficient, Height Loss (781.91), Hypothyroid, Hysterectomy, Low Calcium Intake (269.3), Postmenopausal, Synthroid Fractures: None Treatments: Estradiol-tablet, Vitamin D (E933.5) ASSESSMENT: The BMD measured at Femur Neck Right is 1.085 g/cm2 with a T-score of 0.3. This patient is considered normal according to [facility]) criteria. The scan quality is good. Site Region Measured Date Measured Age YA BMD Significant CHANGE T-score DualFemur Neck Right 10/14/2019    54.7         0.3     1.085 g/cm2 AP Spine  L1-L4      10/14/2019    54.7         1.5     1.355 g/cm2 DualFemur Total Mean 10/14/2019    54.7         1.4     1.184 g/cm2 World Health Organization  [facility]) criteria for post-menopausal, Caucasian Women: Normal       T-score at or above -1 SD Osteopenia   T-score between -1 and -2.5 SD Osteoporosis T-score at or below -2.5 SD RECOMMENDATION: 1. All patients should optimize calcium and vitamin D intake. 2. Consider FDA approved medical therapies in postmenopausal women and men aged 43 years and older, based on the following: a. A hip or vertebral (clinical or morphometric) fracture b. T- score < or = -2.5 at the femoral neck or spine after appropriate evaluation to exclude secondary causes c. Low bone mass (T-score between -1.0 and -2.5 at the femoral neck or spine) and a 10 year probability of a hip fracture > or = 3% or a 10 year probability of a major osteoporosis-related fracture > or = 20% based on the [place]-adapted WHO algorithm d. Clinician judgment and/or patient preferences may indicate treatment for people with 10-year fracture probabilities above or below these levels FOLLOW-UP: Patients with diagnosis of osteoporosis or at high risk for fracture should have regular bone mineral density tests. For patients eligible for Medicare, routine testing is allowed once every 2 years. The testing frequency can be increased to one year for patients who have rapidly progressing disease, those who are receiving or discontinuing medical therapy to restore bone mass, or have additional risk factors. I have reviewed this report and agree with the above findings. [facility] Radiology Electronically Signed   By: Lowella Grip III M.D.   On: 10/14/2019 08:47   US BREAST LTD UNI LEFT INC AXILLA  Result Date: 10/27/2019 CLINICAL DATA:  Possible asymmetries in the outer right breast in the craniocaudal projection of a recent screening mammogram, possible asymmetry in the central left breast in the oblique projection of the screening mammogram and possible mass in the anterior aspect of the left breast on the  screening mammogram. EXAM: DIGITAL DIAGNOSTIC BILATERAL  MAMMOGRAM WITH CAD AND TOMO ULTRASOUND BILATERAL BREAST COMPARISON:  Previous exam(s). ACR Breast Density Category c: The breast tissue is heterogeneously dense, which may obscure small masses. FINDINGS: 3D tomographic and 2D generated true lateral and spot compression views of both breasts were obtained. These confirm a small area of asymmetry with pronounced distortion in the outer right breast, only definitely seen in the craniocaudal projection. Normal appearing fibroglandular tissue is demonstrated at the location of suspected asymmetry in the anterior right breast slightly laterally, unchanged compared to previous examinations. Normal appearing fibroglandular tissue is demonstrated in the central left breast at the location of recently suspected asymmetry, unchanged compared previous examinations. There is a rounded, circumscribed and partially obscured retroareolar mass on the left with a smaller adjacent, similar appearing mass. Mammographic images were processed with CAD. On physical exam, no mass is palpable in the outer right breast or right axilla. Targeted ultrasound is performed, showing normal appearing breast tissue throughout the outer right breast with no visible mass or distortion. Ultrasound of the right axilla demonstrated normal appearing right axillary lymph nodes. A 1.4 cm cyst is demonstrated in the 3 o'clock retroareolar left breast with an adjacent 9 mm bilobed cyst. These correspond to the mammographic masses. IMPRESSION: 1. Small area of asymmetry with pronounced focal distortion in the outer right breast with no ultrasound correlate. 2. Benign left breast cysts. RECOMMENDATION: 3D stereotactic guided needle biopsy of the small asymmetry with pronounced focal distortion in the outer right breast. This has been discussed with the patient and her husband and scheduled at 11:30 a.m. on 11/01/2019. I have discussed the findings and recommendations with the patient. If applicable, a reminder  letter will be sent to the patient regarding the next appointment. BI-RADS CATEGORY  4: Suspicious. Electronically Signed   By: Claudie Revering M.D.   On: 10/27/2019 13:13   US BREAST LTD UNI RIGHT INC AXILLA  Result Date: 10/27/2019 CLINICAL DATA:  Possible asymmetries in the outer right breast in the craniocaudal projection of a recent screening mammogram, possible asymmetry in the central left breast in the oblique projection of the screening mammogram and possible mass in the anterior aspect of the left breast on the screening mammogram. EXAM: DIGITAL DIAGNOSTIC BILATERAL MAMMOGRAM WITH CAD AND TOMO ULTRASOUND BILATERAL BREAST COMPARISON:  Previous exam(s). ACR Breast Density Category c: The breast tissue is heterogeneously dense, which may obscure small masses. FINDINGS: 3D tomographic and 2D generated true lateral and spot compression views of both breasts were obtained. These confirm a small area of asymmetry with pronounced distortion in the outer right breast, only definitely seen in the craniocaudal projection. Normal appearing fibroglandular tissue is demonstrated at the location of suspected asymmetry in the anterior right breast slightly laterally, unchanged compared to previous examinations. Normal appearing fibroglandular tissue is demonstrated in the central left breast at the location of recently suspected asymmetry, unchanged compared previous examinations. There is a rounded, circumscribed and partially obscured retroareolar mass on the left with a smaller adjacent, similar appearing mass. Mammographic images were processed with CAD. On physical exam, no mass is palpable in the outer right breast or right axilla. Targeted ultrasound is performed, showing normal appearing breast tissue throughout the outer right breast with no visible mass or distortion. Ultrasound of the right axilla demonstrated normal appearing right axillary lymph nodes. A 1.4 cm cyst is demonstrated in the 3 o'clock  retroareolar left breast with an adjacent 9 mm bilobed cyst. These correspond to  the mammographic masses. IMPRESSION: 1. Small area of asymmetry with pronounced focal distortion in the outer right breast with no ultrasound correlate. 2. Benign left breast cysts. RECOMMENDATION: 3D stereotactic guided needle biopsy of the small asymmetry with pronounced focal distortion in the outer right breast. This has been discussed with the patient and her husband and scheduled at 11:30 a.m. on 11/01/2019. I have discussed the findings and recommendations with the patient. If applicable, a reminder letter will be sent to the patient regarding the next appointment. BI-RADS CATEGORY  4: Suspicious. Electronically Signed   By: Claudie Revering M.D.   On: 10/27/2019 13:13   MM DIAG BREAST TOMO BILATERAL  Result Date: 10/27/2019 CLINICAL DATA:  Possible asymmetries in the outer right breast in the craniocaudal projection of a recent screening mammogram, possible asymmetry in the central left breast in the oblique projection of the screening mammogram and possible mass in the anterior aspect of the left breast on the screening mammogram. EXAM: DIGITAL DIAGNOSTIC BILATERAL MAMMOGRAM WITH CAD AND TOMO ULTRASOUND BILATERAL BREAST COMPARISON:  Previous exam(s). ACR Breast Density Category c: The breast tissue is heterogeneously dense, which may obscure small masses. FINDINGS: 3D tomographic and 2D generated true lateral and spot compression views of both breasts were obtained. These confirm a small area of asymmetry with pronounced distortion in the outer right breast, only definitely seen in the craniocaudal projection. Normal appearing fibroglandular tissue is demonstrated at the location of suspected asymmetry in the anterior right breast slightly laterally, unchanged compared to previous examinations. Normal appearing fibroglandular tissue is demonstrated in the central left breast at the location of recently suspected asymmetry,  unchanged compared previous examinations. There is a rounded, circumscribed and partially obscured retroareolar mass on the left with a smaller adjacent, similar appearing mass. Mammographic images were processed with CAD. On physical exam, no mass is palpable in the outer right breast or right axilla. Targeted ultrasound is performed, showing normal appearing breast tissue throughout the outer right breast with no visible mass or distortion. Ultrasound of the right axilla demonstrated normal appearing right axillary lymph nodes. A 1.4 cm cyst is demonstrated in the 3 o'clock retroareolar left breast with an adjacent 9 mm bilobed cyst. These correspond to the mammographic masses. IMPRESSION: 1. Small area of asymmetry with pronounced focal distortion in the outer right breast with no ultrasound correlate. 2. Benign left breast cysts. RECOMMENDATION: 3D stereotactic guided needle biopsy of the small asymmetry with pronounced focal distortion in the outer right breast. This has been discussed with the patient and her husband and scheduled at 11:30 a.m. on 11/01/2019. I have discussed the findings and recommendations with the patient. If applicable, a reminder letter will be sent to the patient regarding the next appointment. BI-RADS CATEGORY  4: Suspicious. Electronically Signed   By: Claudie Revering M.D.   On: 10/27/2019 13:13   MM 3D SCREEN BREAST BILATERAL  Result Date: 10/14/2019 CLINICAL DATA:  Screening. EXAM: DIGITAL SCREENING BILATERAL MAMMOGRAM WITH TOMO AND CAD COMPARISON:  Previous exam(s). ACR Breast Density Category c: The breast tissue is heterogeneously dense, which may obscure small masses. FINDINGS: In the right breast asymmetries require further evaluation. In the left breast possible mass and an asymmetry require further evaluation. Images were processed with CAD. IMPRESSION: Further evaluation is suggested for possible asymmetry in the right breast. Further evaluation is suggested for possible  possible mass and asymmetry in the left breast. RECOMMENDATION: Diagnostic mammogram and possibly ultrasound of both breasts. (Code:[id number]) The patient will be contacted  regarding the findings, and additional imaging will be scheduled. BI-RADS CATEGORY  0: Incomplete. Need additional imaging evaluation and/or prior mammograms for comparison. Electronically Signed   By: Dorise Bullion III M.D   On: 10/14/2019 16:06   MM CLIP PLACEMENT RIGHT  Result Date: 11/01/2019 CLINICAL DATA:  Confirmation of clip placement after stereotactic tomosynthesis core needle biopsy of architectural distortion involving the UPPER OUTER QUADRANT of the RIGHT breast at MIDDLE depth. EXAM: 2D AND TOMOSYNTHESIS DIAGNOSTIC RIGHT MAMMOGRAM POST STEREOTACTIC BIOPSY COMPARISON:  Previous exam(s). FINDINGS: Tomosynthesis and synthesized full field CC and mediolateral images were obtained following stereotactic guided biopsy of architectural distortion involving the UPPER OUTER QUADRANT of the RIGHT breast. The coil shaped tissue marking clip is appropriately position at the SUPERIOR margin of the biopsied architectural distortion. Expected post biopsy changes are present without evidence of hematoma. IMPRESSION: Appropriate positioning of the coil shaped tissue marking clip at the site of the biopsied distortion in the [place] of the RIGHT breast at MIDDLE depth. Final Assessment: Post Procedure Mammograms for Marker Placement Electronically Signed   By: Evangeline Dakin M.D.   On: 11/01/2019 12:15   MM RT BREAST BX W LOC DEV 1ST LESION IMAGE BX SPEC STEREO GUIDE  Addendum Date: 11/08/2019   ADDENDUM REPORT: 11/02/2019 14:15 ADDENDUM: Pathology revealed GRADE I INVASIVE MAMMARY CARCINOMA of the RIGHT breast, upper outer quadrant at middle depth. This was found to be concordant by Dr. Peggye Fothergill. Pathology results were discussed with the patient by telephone. The patient reported doing well after the biopsy with  tenderness at the site. Post biopsy instructions and care were reviewed and questions were answered. The patient was encouraged to call [facility] for any additional concerns. The patient was referred to [facility] on November 10, 2019. Pathology results reported by Stacie Acres RN on 11/02/2019. Electronically Signed   By: Evangeline Dakin M.D.   On: 11/02/2019 14:15   Result Date: 11/08/2019 CLINICAL DATA:  54 year old with screening detected architectural distortion involving the UPPER OUTER QUADRANT of the RIGHT breast at MIDDLE depth. Prior benign core needle biopsy of the RIGHT breast in 2013. No prior RIGHT breast surgery. EXAM: RIGHT BREAST STEREOTACTIC CORE NEEDLE BIOPSY COMPARISON:  Previous exams. FINDINGS: The patient and I discussed the procedure of stereotactic-guided biopsy including benefits and alternatives. We discussed the high likelihood of a successful procedure. We discussed the risks of the procedure including infection, bleeding, tissue injury, clip migration, and inadequate sampling. Informed written consent was given. The usual time out protocol was performed immediately prior to the procedure. Using sterile technique with chlorhexidine as skin antisepsis, 1% lidocaine and 1% lidocaine with epinephrine as local anesthetic, under stereotactic guidance, a 9 gauge Brevera vacuum assisted device was used to perform core needle biopsy of architectural distortion involving the UPPER OUTER QUADRANT of the RIGHT breast using a superior approach. Specimen radiograph was performed showing soft tissue density in multiple core samples. Lesion quadrant: UPPER OUTER QUADRANT. At the conclusion of the procedure, a coil shaped tissue marker clip was deployed into the biopsy cavity. Follow-up 2-view mammogram was performed and dictated separately. IMPRESSION: Stereotactic-guided biopsy of architectural  distortion involving the UPPER OUTER QUADRANT of the RIGHT breast at MIDDLE depth. No apparent complications. Electronically Signed: By: Evangeline Dakin M.D. On: 11/01/2019 12:12     ELIGIBLE FOR AVAILABLE RESEARCH PROTOCOL: AET  ASSESSMENT: 54 y.o. 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  (a) breast MRI  (1) definitive surgery pending  (2) consider Oncotype: Chemotherapy not anticipated  (3) adjuvant radiation  (4) antiestrogens  PLAN: I met today with Kemani  And her husband to review her new diagnosis. Specifically we discussed the biology of her breast cancer, its diagnosis, staging, treatment  options and prognosis. We first reviewed the fact that cancer is not one disease but more than 100 different diseases and that it is important to keep them separate-- otherwise when friends and relatives discuss their own cancer experiences with Farhiya confusion can result. Similarly we explained that if breast cancer spreads to the bone or liver, the patient would not have bone cancer or liver cancer, but breast cancer in the bone and breast cancer in the liver: one cancer in three places-- not 3 different cancers which otherwise would have to be treated in 3 different ways.  We discussed the difference between local and systemic therapy. In terms of loco-regional treatment, lumpectomy plus radiation is equivalent to mastectomy as far as survival is concerned. For this reason, and because the cosmetic results are generally superior, we recommend breast conserving surgery.   We then discussed the rationale for systemic therapy. There is some risk that this cancer may have already spread to other parts of her body. Patients frequently ask at this point about bone scans, CAT scans and PET scans to find out if they have  occult breast cancer somewhere else. The problem is that in early stage disease we are much more likely to find false positives then true cancers and this would expose the patient to unnecessary procedures as well as unnecessary radiation. Scans cannot answer the question the patient really would like to know, which is whether she has microscopic disease elsewhere in her body. For those reasons we do not recommend them.  Of course we would proceed to aggressive evaluation of any symptoms that might suggest metastatic disease, but that is not the case here.  Next we went over the options for systemic therapy which are anti-estrogens, anti-HER-2 immunotherapy, and chemotherapy. Shanice does not meet criteria for anti-HER-2 immunotherapy. She is a good candidate for anti-estrogens.  The question of chemotherapy is more complicated. Chemotherapy is most effective in rapidly growing, aggressive tumors. It is much less effective in low-grade, slow growing cancers, like Mistina 's. For that reason we are going to request an Oncotype from the definitive surgical sample, as suggested by NCCN guidelines.  They understand I expected to be low risk and to indicate a good prognosis without the need for chemotherapy.    Nasteho has a good understanding of the overall plan. She agrees with it. She knows the goal of treatment in her case is cure. She will call with any problems that may develop before her next visit here.  Total encounter time 65 minutes.Sarajane Jews C. Emoni Yang, MD 11/10/2019 6:19 PM Medical Oncology and Hematology [facility] [place], [place] [address] Tel. [phone]    Fax. [phone]   This document serves as a record of services personally performed by Lurline Del, MD. It was created on his behalf by Wilburn Mylar, a trained medical scribe. The creation of this record is based on the scribe's personal observations and the provider's statements to them.   I,  Lurline Del MD, have reviewed the above documentation for accuracy and completeness, and I agree with the above.    *Total Encounter Time as defined by the Centers for Medicare and Medicaid Services includes, in addition to the face-to-face time of a patient visit (documented in the note above) non-face-to-face time: obtaining and reviewing outside history, ordering and reviewing medications, tests or procedures, care coordination (communications with other health care professionals or caregivers) and documentation in the medical record.

## 2019-11-10 ENCOUNTER — Ambulatory Visit
Admission: RE | Admit: 2019-11-10 | Discharge: 2019-11-10 | Disposition: A | Discharge status: Home / Self Care | Payer: BC Managed Care – PPO | Source: Ambulatory Visit | Attending: Radiation Oncology | Admitting: Radiation Oncology

## 2019-11-10 ENCOUNTER — Inpatient Hospital Stay: Payer: BC Managed Care – PPO

## 2019-11-10 ENCOUNTER — Ambulatory Visit: Payer: Self-pay | Admitting: Surgery

## 2019-11-10 ENCOUNTER — Other Ambulatory Visit: Payer: Self-pay

## 2019-11-10 ENCOUNTER — Ambulatory Visit (HOSPITAL_BASED_OUTPATIENT_CLINIC_OR_DEPARTMENT_OTHER): Payer: BC Managed Care – PPO | Admitting: Licensed Clinical Social Worker

## 2019-11-10 ENCOUNTER — Encounter: Payer: Self-pay | Admitting: Licensed Clinical Social Worker

## 2019-11-10 ENCOUNTER — Other Ambulatory Visit: Payer: Self-pay | Admitting: *Deleted

## 2019-11-10 ENCOUNTER — Inpatient Hospital Stay: Payer: BC Managed Care – PPO | Attending: Oncology | Admitting: Oncology

## 2019-11-10 ENCOUNTER — Encounter: Payer: Self-pay | Admitting: *Deleted

## 2019-11-10 VITALS — BP 153/58 | HR 62 | Temp 98.5°F | Resp 18 | Ht 65.0 in | Wt 188.6 lb

## 2019-11-10 DIAGNOSIS — Z170 Estrogen receptor positive status [ER+]: Secondary | ICD-10-CM | POA: Insufficient documentation

## 2019-11-10 DIAGNOSIS — Z800 Family history of malignant neoplasm of digestive organs: Secondary | ICD-10-CM

## 2019-11-10 DIAGNOSIS — C50411 Malignant neoplasm of upper-outer quadrant of right female breast: Secondary | ICD-10-CM

## 2019-11-10 DIAGNOSIS — Z808 Family history of malignant neoplasm of other organs or systems: Secondary | ICD-10-CM | POA: Insufficient documentation

## 2019-11-10 DIAGNOSIS — Z801 Family history of malignant neoplasm of trachea, bronchus and lung: Secondary | ICD-10-CM

## 2019-11-10 DIAGNOSIS — C50312 Malignant neoplasm of lower-inner quadrant of left female breast: Secondary | ICD-10-CM

## 2019-11-10 DIAGNOSIS — Z8049 Family history of malignant neoplasm of other genital organs: Secondary | ICD-10-CM

## 2019-11-10 LAB — CMP (CANCER CENTER ONLY)
ALT: 10 U/L (ref 0–44)
AST: 12 U/L — ABNORMAL LOW (ref 15–41)
Albumin: 4 g/dL (ref 3.5–5.0)
Alkaline Phosphatase: 73 U/L (ref 38–126)
Anion gap: 6 (ref 5–15)
BUN: 14 mg/dL (ref 6–20)
CO2: 27 mmol/L (ref 22–32)
Calcium: 9.1 mg/dL (ref 8.9–10.3)
Chloride: 107 mmol/L (ref 98–111)
Creatinine: 0.92 mg/dL (ref 0.44–1.00)
GFR, Est AFR Am: >60 mL/min (ref >60)
GFR, Estimated: >60 mL/min (ref >60)
Glucose, Bld: 95 mg/dL (ref 70–99)
Potassium: 4.1 mmol/L (ref 3.5–5.1)
Sodium: 140 mmol/L (ref 135–145)
Total Bilirubin: 0.6 mg/dL (ref 0.3–1.2)
Total Protein: 7.5 g/dL (ref 6.5–8.1)

## 2019-11-10 LAB — CBC WITH DIFFERENTIAL (CANCER CENTER ONLY)
Abs Immature Granulocytes: 0.01 10*3/uL (ref 0.00–0.07)
Basophils Absolute: 0.1 10*3/uL (ref 0.0–0.1)
Basophils Relative: 1 %
Eosinophils Absolute: 0.1 10*3/uL (ref 0.0–0.5)
Eosinophils Relative: 2 %
HCT: 42 % (ref 36.0–46.0)
Hemoglobin: 13.5 g/dL (ref 12.0–15.0)
Immature Granulocytes: 0 %
Lymphocytes Relative: 33 %
Lymphs Abs: 1.7 10*3/uL (ref 0.7–4.0)
MCH: 28.5 pg (ref 26.0–34.0)
MCHC: 32.1 g/dL (ref 30.0–36.0)
MCV: 88.6 fL (ref 80.0–100.0)
Monocytes Absolute: 0.4 10*3/uL (ref 0.1–1.0)
Monocytes Relative: 7 %
Neutro Abs: 2.9 10*3/uL (ref 1.7–7.7)
Neutrophils Relative %: 57 %
Platelet Count: 224 10*3/uL (ref 150–400)
RBC: 4.74 MIL/uL (ref 3.87–5.11)
RDW: 12.9 % (ref 11.5–15.5)
WBC Count: 5.1 10*3/uL (ref 4.0–10.5)
nRBC: 0 % (ref 0.0–0.2)

## 2019-11-10 LAB — GENETIC SCREENING ORDER

## 2019-11-10 MED ORDER — LORAZEPAM 0.5 MG PO TABS: 0.5000 mg | ORAL_TABLET | Freq: Once | ORAL | 0 refills | Status: AC

## 2019-11-10 NOTE — Progress Notes (Signed)
Radiation Oncology         [phone] ________________________________  Multidisciplinary Breast Oncology Clinic [facility]) Initial Outpatient Consultation  Name: Alexis Young MRN: [id number]  Date: 11/10/2019  DOB: 1966/05/19  AL:PFXTK, Claudina Lick, MD  Erroll Luna, MD   REFERRING PHYSICIAN: Erroll Luna, MD  DIAGNOSIS: The encounter diagnosis was Malignant neoplasm of upper-outer quadrant of right breast in female, estrogen receptor positive ([place]).  Stage Tx Right Breast UOQ, Invasive Lobular Carcinoma, ER+ / PR+ / Her2-, Grade 1    ICD-10-CM   1. Malignant neoplasm of upper-outer quadrant of right breast in female, estrogen receptor positive ([place])  C50.411    Z17.0     HISTORY OF PRESENT ILLNESS::Alexis Young is a 54 y.o. female who is presenting to the office today for evaluation of her newly diagnosed breast cancer. She is accompanied by her husband. She is doing well overall.   She had routine screening mammography on 10/14/2019 showing a possible asymmetry in the right breast and a possible mass and asymmetry in the left breast. She underwent bilateral diagnostic mammography with tomography and bilateral breast ultrasonography at [facility] on 10/27/2019 that showed a small area of asymmetry with pronounced focal distortion in the outer right breast with no ultrasound correlate and benign left breast cysts.  Biopsy on 11/01/2019 showed grade 1 invasive mammary carcinoma and fibroadenoma. Prognostic indicators significant for estrogen receptor, 80% positive and progesterone receptor, 80% positive, both with strong staining intensities. Proliferation marker Ki67 at 2%. HER2 negative.  Menarche: 54 years old Age at first live birth: 54 years old GP: 1 LMP: 2009 Contraceptive: Yes, for 14 years HRT: Yes, for 12 years   The patient was referred today for presentation in the multidisciplinary conference.  Radiology studies and pathology slides were presented there  for review and discussion of treatment options.  A consensus was discussed regarding potential next steps.  PREVIOUS RADIATION THERAPY: No  PAST MEDICAL HISTORY:  Past Medical History:  Diagnosis Date  . Arthritis   . Cancer ([place]) 03/2013, 06/2013   malignant melanoma on right arm and back  . Dyspareunia   . Elevated serum creatinine   . Family history of colon cancer   . Family history of lung cancer   . Family history of melanoma   . Family history of pancreatic cancer   . Family history of stomach cancer   . Family history of uterine cancer   . Fibroid   . GERD (gastroesophageal reflux disease)    takes protonix as needed   . 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 Tennis elbow 2017   Right  . Tinnitus 2020   both ears    PAST SURGICAL HISTORY: Past Surgical History:  Procedure Laterality Date  . BILATERAL SALPINGECTOMY Bilateral 02/14/2015   Procedure: BILATERAL SALPINGECTOMY OOPHORECTOMY;  Surgeon: Nunzio Cobbs, MD;  Location: [facility];  Service: Gynecology;  Laterality: Bilateral;  . BREAST BIOPSY  1987   Dr. Rosana Hoes  . BREAST EXCISIONAL BIOPSY Left    benign  . BREAST SURGERY Right 04/2010   breast biopsy for multiple fibroadenoma - no atypia  . CATARACT EXTRACTION, BILATERAL    . CESAREAN SECTION    . CRYOTHERAPY  5/91   for persistent condyloma atypia  . CYSTOSCOPY N/A 02/14/2015   Procedure: CYSTOSCOPY;  Surgeon: Nunzio Cobbs, MD;  Location: [facility];  Service: Gynecology;  Laterality: N/A;  . LASER ABLATION  OF CONDYLOMAS  03/05/90 Dr. 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 MELANOMA EXCISION Right 03/2013   melanoma right arm  . ROBOTIC ASSISTED TOTAL HYSTERECTOMY N/A 02/14/2015   Procedure: ROBOTIC ASSISTED TOTAL HYSTERECTOMY;  Surgeon: Nunzio Cobbs, MD;  Location: [facility];  Service: Gynecology;  Laterality: N/A;  . SKIN CANCER EXCISION  06/2013   midddle of back with path report of atypia  .  TUBAL LIGATION Bilateral   . WISDOM TOOTH EXTRACTION      FAMILY HISTORY:  Family History  Problem Relation Age of Onset  . Cancer Mother        lung and liver  . Alcohol abuse Father        Cirrhosis of liver  . Cancer Maternal Aunt        stomach cancer  . Cancer Maternal Grandmother        uterine cancer  . Heart failure Paternal Grandmother   . Lung cancer Maternal Uncle   . Lung cancer Maternal Uncle   . Pancreatic cancer Paternal Uncle   . Testicular cancer Cousin   . Melanoma Cousin        of eye  . Cancer Cousin        possible colon? dx 1s  . Colon cancer Other   . Breast cancer Neg Hx     SOCIAL HISTORY:  Social History   Socioeconomic History  . Marital status: Married    Spouse name: Not on file  . Number of children: 1  . Years of education: Not on file  . Highest education level: Not on file  Occupational History    Employer: [employer]  Tobacco Use  . Smoking status: Never Smoker  . Smokeless tobacco: Never Used  Substance and Sexual Activity  . Alcohol use: No  . Drug use: No  . Sexual activity: Yes    Partners: Male    Birth control/protection: Surgical    Comment: Hysterectomy  Other Topics Concern  . Not on file  Social History Narrative  . Not on file   Social Determinants of Health   Financial Resource Strain:   . Difficulty of Paying Living Expenses:   Food Insecurity:   . Worried About Charity fundraiser in the Last Year:   . Arboriculturist in the Last Year:   Transportation Needs:   . Film/video editor (Medical):   Marland Kitchen Lack of Transportation (Non-Medical):   Physical Activity:   . Days of Exercise per Week:   . Minutes of Exercise per Session:   Stress:   . Feeling of Stress :   Social Connections:   . Frequency of Communication with Friends and Family:   . Frequency of Social Gatherings with Friends and Family:   . Attends Religious Services:   . Active Member of Clubs or Organizations:   . Attends  Archivist Meetings:   Marland Kitchen Marital Status:     ALLERGIES:  Allergies  Allergen Reactions  . Tetracyclines & Related Nausea Only  . Asa [Aspirin] Nausea And Vomiting and Rash  . Sulfa Antibiotics Rash    MEDICATIONS:  Current Outpatient Medications  Medication Sig Dispense Refill  . estradiol (ESTRACE) 1 MG tablet TAKE 1 TABLET (1 MG TOTAL) BY MOUTH DAILY. 90 tablet 3  . levothyroxine (SYNTHROID) 50 MCG tablet TAKE 1 TABLET BY MOUTH EVERY DAY 90 tablet 1  . LORazepam (ATIVAN) 0.5 MG tablet Take 1 tablet (  0.5 mg total) by mouth once for 1 dose. 2 tablet 0  . tretinoin (RETIN-A) 0.025 % cream APPLY A PEA SIZE AMOUNT TO AFFECTED AREAS NIGHTLY.    Marland Kitchen VITAMIN D PO Take by mouth.     No current facility-administered medications for this encounter.    REVIEW OF SYSTEMS: A 10+ POINT REVIEW OF SYSTEMS WAS OBTAINED including neurology, dermatology, psychiatry, cardiac, respiratory, lymph, extremities, GI, GU, musculoskeletal, constitutional, reproductive, HEENT. On the provided form, she reports hearing loss and hematuria. She also reports history of wearing glasses, posterior vitreous detachment, skin cancer (melanoma), arthritis, and thyroid problems. She denies fever, chills, abdominal pain, chest pain, nausea, vomiting, diarrhea, and any other symptoms.  Patient denies any pain within the breast area nipple discharge or bleeding prior to biopsy.  Patient understands she will need to stop her Estrace tablet.   PHYSICAL EXAM:  Vitals with BMI 11/10/2019  Height '5\' 5"'$   Weight 188 lbs 10 oz  BMI 16.10  Systolic 960  Diastolic 58  Pulse 62   Lungs are clear to auscultation bilaterally. Heart has regular rate and rhythm. No palpable cervical, supraclavicular, or axillary adenopathy. Abdomen soft, non-tender, normal bowel sounds. Left breast is pendulous with no palpable mass, nipple discharge, or bleeding.  Right breast is pendulous with bruising in the upper outer quadrant. No  palpable mass, nipple discharge, or bleeding. There is a scar on the patient's right forearm and right shoulder from prior melanoma mohs surgery.  KPS = 90  100 - Normal; no complaints; no evidence of disease. 90   - Able to carry on normal activity; minor signs or symptoms of disease. 80   - Normal activity with effort; some signs or symptoms of disease. 69   - Cares for self; unable to carry on normal activity or to do active work. 60   - Requires occasional assistance, but is able to care for most of his personal needs. 50   - Requires considerable assistance and frequent medical care. 105   - Disabled; requires special care and assistance. 48   - Severely disabled; hospital admission is indicated although death not imminent. 17   - Very sick; hospital admission necessary; active supportive treatment necessary. 10   - Moribund; fatal processes progressing rapidly. 0     - Dead  Karnofsky DA, Abelmann [place], Craver LS and Burchenal [facility] [phone]) The use of the nitrogen mustards in the palliative treatment of carcinoma: with particular reference to bronchogenic carcinoma Cancer 1 634-56  LABORATORY DATA:  Lab Results  Component Value Date   WBC 5.1 11/10/2019   HGB 13.5 11/10/2019   HCT 42.0 11/10/2019   MCV 88.6 11/10/2019   PLT 224 11/10/2019   Lab Results  Component Value Date   NA 140 11/10/2019   K 4.1 11/10/2019   CL 107 11/10/2019   CO2 27 11/10/2019   Lab Results  Component Value Date   ALT 10 11/10/2019   AST 12 (L) 11/10/2019   ALKPHOS 73 11/10/2019   BILITOT 0.6 11/10/2019    PULMONARY FUNCTION TEST:   Recent Review Flowsheet Data    There is no flowsheet data to display.      RADIOGRAPHY: DG Bone Density  Result Date: 10/14/2019 EXAM: DUAL X-RAY ABSORPTIOMETRY (DXA) FOR BONE MINERAL DENSITY IMPRESSION: Referring Physician:  Nunzio Cobbs Your patient completed a BMD test using Lunar IDXA DXA system ( analysis version: 16 ) manufactured by [employer]. Technologist: WLS PATIENT: Name: Borg,  Anina Schnake Patient ID: [id number] Birth Date: 1965/12/07 Height: 65.0 in. Sex: Female Measured: 10/14/2019 Weight: 191.0 lbs. Indications: Bilateral Ovariectomy (65.51), Caucasian, Estrogen Deficient, Height Loss (781.91), Hypothyroid, Hysterectomy, Low Calcium Intake (269.3), Postmenopausal, Synthroid Fractures: None Treatments: Estradiol-tablet, Vitamin D (E933.5) ASSESSMENT: The BMD measured at Femur Neck Right is 1.085 g/cm2 with a T-score of 0.3. This patient is considered normal according to [facility]) criteria. The scan quality is good. Site Region Measured Date Measured Age YA BMD Significant CHANGE T-score DualFemur Neck Right 10/14/2019    54.7         0.3     1.085 g/cm2 AP Spine  L1-L4      10/14/2019    54.7         1.5     1.355 g/cm2 DualFemur Total Mean 10/14/2019    54.7         1.4     1.184 g/cm2 World Health Organization [facility]) criteria for post-menopausal, Caucasian Women: Normal       T-score at or above -1 SD Osteopenia   T-score between -1 and -2.5 SD Osteoporosis T-score at or below -2.5 SD RECOMMENDATION: 1. All patients should optimize calcium and vitamin D intake. 2. Consider FDA approved medical therapies in postmenopausal women and men aged 24 years and older, based on the following: a. A hip or vertebral (clinical or morphometric) fracture b. T- score < or = -2.5 at the femoral neck or spine after appropriate evaluation to exclude secondary causes c. Low bone mass (T-score between -1.0 and -2.5 at the femoral neck or spine) and a 10 year probability of a hip fracture > or = 3% or a 10 year probability of a major osteoporosis-related fracture > or = 20% based on the [place]-adapted WHO algorithm d. Clinician judgment and/or patient preferences may indicate treatment for people with 10-year fracture probabilities above or below these levels FOLLOW-UP: Patients with diagnosis of osteoporosis or at high risk for fracture  should have regular bone mineral density tests. For patients eligible for Medicare, routine testing is allowed once every 2 years. The testing frequency can be increased to one year for patients who have rapidly progressing disease, those who are receiving or discontinuing medical therapy to restore bone mass, or have additional risk factors. I have reviewed this report and agree with the above findings. [facility] Radiology Electronically Signed   By: Lowella Grip III M.D.   On: 10/14/2019 08:47   US BREAST LTD UNI LEFT INC AXILLA  Result Date: 10/27/2019 CLINICAL DATA:  Possible asymmetries in the outer right breast in the craniocaudal projection of a recent screening mammogram, possible asymmetry in the central left breast in the oblique projection of the screening mammogram and possible mass in the anterior aspect of the left breast on the screening mammogram. EXAM: DIGITAL DIAGNOSTIC BILATERAL MAMMOGRAM WITH CAD AND TOMO ULTRASOUND BILATERAL BREAST COMPARISON:  Previous exam(s). ACR Breast Density Category c: The breast tissue is heterogeneously dense, which may obscure small masses. FINDINGS: 3D tomographic and 2D generated true lateral and spot compression views of both breasts were obtained. These confirm a small area of asymmetry with pronounced distortion in the outer right breast, only definitely seen in the craniocaudal projection. Normal appearing fibroglandular tissue is demonstrated at the location of suspected asymmetry in the anterior right breast slightly laterally, unchanged compared to previous examinations. Normal appearing fibroglandular tissue is demonstrated in the central left breast at the location of recently suspected asymmetry, unchanged compared previous  examinations. There is a rounded, circumscribed and partially obscured retroareolar mass on the left with a smaller adjacent, similar appearing mass. Mammographic images were processed with CAD. On physical exam, no mass is  palpable in the outer right breast or right axilla. Targeted ultrasound is performed, showing normal appearing breast tissue throughout the outer right breast with no visible mass or distortion. Ultrasound of the right axilla demonstrated normal appearing right axillary lymph nodes. A 1.4 cm cyst is demonstrated in the 3 o'clock retroareolar left breast with an adjacent 9 mm bilobed cyst. These correspond to the mammographic masses. IMPRESSION: 1. Small area of asymmetry with pronounced focal distortion in the outer right breast with no ultrasound correlate. 2. Benign left breast cysts. RECOMMENDATION: 3D stereotactic guided needle biopsy of the small asymmetry with pronounced focal distortion in the outer right breast. This has been discussed with the patient and her husband and scheduled at 11:30 a.m. on 11/01/2019. I have discussed the findings and recommendations with the patient. If applicable, a reminder letter will be sent to the patient regarding the next appointment. BI-RADS CATEGORY  4: Suspicious. Electronically Signed   By: Claudie Revering M.D.   On: 10/27/2019 13:13   US BREAST LTD UNI RIGHT INC AXILLA  Result Date: 10/27/2019 CLINICAL DATA:  Possible asymmetries in the outer right breast in the craniocaudal projection of a recent screening mammogram, possible asymmetry in the central left breast in the oblique projection of the screening mammogram and possible mass in the anterior aspect of the left breast on the screening mammogram. EXAM: DIGITAL DIAGNOSTIC BILATERAL MAMMOGRAM WITH CAD AND TOMO ULTRASOUND BILATERAL BREAST COMPARISON:  Previous exam(s). ACR Breast Density Category c: The breast tissue is heterogeneously dense, which may obscure small masses. FINDINGS: 3D tomographic and 2D generated true lateral and spot compression views of both breasts were obtained. These confirm a small area of asymmetry with pronounced distortion in the outer right breast, only definitely seen in the craniocaudal  projection. Normal appearing fibroglandular tissue is demonstrated at the location of suspected asymmetry in the anterior right breast slightly laterally, unchanged compared to previous examinations. Normal appearing fibroglandular tissue is demonstrated in the central left breast at the location of recently suspected asymmetry, unchanged compared previous examinations. There is a rounded, circumscribed and partially obscured retroareolar mass on the left with a smaller adjacent, similar appearing mass. Mammographic images were processed with CAD. On physical exam, no mass is palpable in the outer right breast or right axilla. Targeted ultrasound is performed, showing normal appearing breast tissue throughout the outer right breast with no visible mass or distortion. Ultrasound of the right axilla demonstrated normal appearing right axillary lymph nodes. A 1.4 cm cyst is demonstrated in the 3 o'clock retroareolar left breast with an adjacent 9 mm bilobed cyst. These correspond to the mammographic masses. IMPRESSION: 1. Small area of asymmetry with pronounced focal distortion in the outer right breast with no ultrasound correlate. 2. Benign left breast cysts. RECOMMENDATION: 3D stereotactic guided needle biopsy of the small asymmetry with pronounced focal distortion in the outer right breast. This has been discussed with the patient and her husband and scheduled at 11:30 a.m. on 11/01/2019. I have discussed the findings and recommendations with the patient. If applicable, a reminder letter will be sent to the patient regarding the next appointment. BI-RADS CATEGORY  4: Suspicious. Electronically Signed   By: Claudie Revering M.D.   On: 10/27/2019 13:13   MM DIAG BREAST TOMO BILATERAL  Result Date: 10/27/2019 CLINICAL  DATA:  Possible asymmetries in the outer right breast in the craniocaudal projection of a recent screening mammogram, possible asymmetry in the central left breast in the oblique projection of the  screening mammogram and possible mass in the anterior aspect of the left breast on the screening mammogram. EXAM: DIGITAL DIAGNOSTIC BILATERAL MAMMOGRAM WITH CAD AND TOMO ULTRASOUND BILATERAL BREAST COMPARISON:  Previous exam(s). ACR Breast Density Category c: The breast tissue is heterogeneously dense, which may obscure small masses. FINDINGS: 3D tomographic and 2D generated true lateral and spot compression views of both breasts were obtained. These confirm a small area of asymmetry with pronounced distortion in the outer right breast, only definitely seen in the craniocaudal projection. Normal appearing fibroglandular tissue is demonstrated at the location of suspected asymmetry in the anterior right breast slightly laterally, unchanged compared to previous examinations. Normal appearing fibroglandular tissue is demonstrated in the central left breast at the location of recently suspected asymmetry, unchanged compared previous examinations. There is a rounded, circumscribed and partially obscured retroareolar mass on the left with a smaller adjacent, similar appearing mass. Mammographic images were processed with CAD. On physical exam, no mass is palpable in the outer right breast or right axilla. Targeted ultrasound is performed, showing normal appearing breast tissue throughout the outer right breast with no visible mass or distortion. Ultrasound of the right axilla demonstrated normal appearing right axillary lymph nodes. A 1.4 cm cyst is demonstrated in the 3 o'clock retroareolar left breast with an adjacent 9 mm bilobed cyst. These correspond to the mammographic masses. IMPRESSION: 1. Small area of asymmetry with pronounced focal distortion in the outer right breast with no ultrasound correlate. 2. Benign left breast cysts. RECOMMENDATION: 3D stereotactic guided needle biopsy of the small asymmetry with pronounced focal distortion in the outer right breast. This has been discussed with the patient and her  husband and scheduled at 11:30 a.m. on 11/01/2019. I have discussed the findings and recommendations with the patient. If applicable, a reminder letter will be sent to the patient regarding the next appointment. BI-RADS CATEGORY  4: Suspicious. Electronically Signed   By: Claudie Revering M.D.   On: 10/27/2019 13:13   MM 3D SCREEN BREAST BILATERAL  Result Date: 10/14/2019 CLINICAL DATA:  Screening. EXAM: DIGITAL SCREENING BILATERAL MAMMOGRAM WITH TOMO AND CAD COMPARISON:  Previous exam(s). ACR Breast Density Category c: The breast tissue is heterogeneously dense, which may obscure small masses. FINDINGS: In the right breast asymmetries require further evaluation. In the left breast possible mass and an asymmetry require further evaluation. Images were processed with CAD. IMPRESSION: Further evaluation is suggested for possible asymmetry in the right breast. Further evaluation is suggested for possible possible mass and asymmetry in the left breast. RECOMMENDATION: Diagnostic mammogram and possibly ultrasound of both breasts. (Code:[id number]) The patient will be contacted regarding the findings, and additional imaging will be scheduled. BI-RADS CATEGORY  0: Incomplete. Need additional imaging evaluation and/or prior mammograms for comparison. Electronically Signed   By: Dorise Bullion III M.D   On: 10/14/2019 16:06   MM CLIP PLACEMENT RIGHT  Result Date: 11/01/2019 CLINICAL DATA:  Confirmation of clip placement after stereotactic tomosynthesis core needle biopsy of architectural distortion involving the UPPER OUTER QUADRANT of the RIGHT breast at MIDDLE depth. EXAM: 2D AND TOMOSYNTHESIS DIAGNOSTIC RIGHT MAMMOGRAM POST STEREOTACTIC BIOPSY COMPARISON:  Previous exam(s). FINDINGS: Tomosynthesis and synthesized full field CC and mediolateral images were obtained following stereotactic guided biopsy of architectural distortion involving the UPPER OUTER QUADRANT of the RIGHT breast. The  coil shaped tissue marking clip  is appropriately position at the SUPERIOR margin of the biopsied architectural distortion. Expected post biopsy changes are present without evidence of hematoma. IMPRESSION: Appropriate positioning of the coil shaped tissue marking clip at the site of the biopsied distortion in the [place] of the RIGHT breast at MIDDLE depth. Final Assessment: Post Procedure Mammograms for Marker Placement Electronically Signed   By: Evangeline Dakin M.D.   On: 11/01/2019 12:15   MM RT BREAST BX W LOC DEV 1ST LESION IMAGE BX SPEC STEREO GUIDE  Addendum Date: 11/08/2019   ADDENDUM REPORT: 11/02/2019 14:15 ADDENDUM: Pathology revealed GRADE I INVASIVE MAMMARY CARCINOMA of the RIGHT breast, upper outer quadrant at middle depth. This was found to be concordant by Dr. Peggye Fothergill. Pathology results were discussed with the patient by telephone. The patient reported doing well after the biopsy with tenderness at the site. Post biopsy instructions and care were reviewed and questions were answered. The patient was encouraged to call [facility] for any additional concerns. The patient was referred to [facility] on November 10, 2019. Pathology results reported by Stacie Acres RN on 11/02/2019. Electronically Signed   By: Evangeline Dakin M.D.   On: 11/02/2019 14:15   Result Date: 11/08/2019 CLINICAL DATA:  54 year old with screening detected architectural distortion involving the UPPER OUTER QUADRANT of the RIGHT breast at MIDDLE depth. Prior benign core needle biopsy of the RIGHT breast in 2013. No prior RIGHT breast surgery. EXAM: RIGHT BREAST STEREOTACTIC CORE NEEDLE BIOPSY COMPARISON:  Previous exams. FINDINGS: The patient and I discussed the procedure of stereotactic-guided biopsy including benefits and alternatives. We discussed the high likelihood of a successful procedure. We discussed the risks of the procedure  including infection, bleeding, tissue injury, clip migration, and inadequate sampling. Informed written consent was given. The usual time out protocol was performed immediately prior to the procedure. Using sterile technique with chlorhexidine as skin antisepsis, 1% lidocaine and 1% lidocaine with epinephrine as local anesthetic, under stereotactic guidance, a 9 gauge Bre[place]a vacuum assisted device was used to perform core needle biopsy of architectural distortion involving the UPPER OUTER QUADRANT of the RIGHT breast using a superior approach. Specimen radiograph was performed showing soft tissue density in multiple core samples. Lesion quadrant: UPPER OUTER QUADRANT. At the conclusion of the procedure, a coil shaped tissue marker clip was deployed into the biopsy cavity. Follow-up 2-view mammogram was performed and dictated separately. IMPRESSION: Stereotactic-guided biopsy of architectural distortion involving the UPPER OUTER QUADRANT of the RIGHT breast at MIDDLE depth. No apparent complications. Electronically Signed: By: Evangeline Dakin M.D. On: 11/01/2019 12:12      IMPRESSION: Stage Tx Right Breast UOQ, Invasive Lobular Carcinoma, ER+ / PR+ / Her2-, Grade 1  Patient will be a good candidate for breast conservation with radiotherapy to the right breast. We discussed the general course of radiation, potential side effects, and toxicities with radiation and the patient is interested in this approach.  We discussed potential treatment in [place]. However, the patient will likely come here for radiation therapy since her child is in school in the [place] area.  PLAN:  1. MRI (lobular dx) 2. Right lumpectomy with sentinel node biopsy 3. Oncoptype depending on final tumor size 4. Adjuvant radiation therapy 5. Aromatase inhibitor   ------------------------------------------------  Blair Promise, PhD, MD  This document serves as a record of services personally performed by Gery Pray,  MD.  It was created on his behalf by Clerance Lav, a trained medical scribe. The creation of this record is based on the scribe's personal observations and the provider's statements to them. This document has been checked and approved by the attending provider.

## 2019-11-10 NOTE — H&P (Signed)
awn Veltre Documented: 11/10/2019 8:18 AM Location: [facility] Patient #: [id number] DOB: 1966-05-26 Undefined / Language: Cleophus Molt / Race: White Female  History of Present Illness Marcello Moores A. Alexxis Mackert MD; 11/10/2019 12:02 PM) Patient words: pt sent at the request of dr Sondra Come for mammographic abnormality 1 cm UOQ ILC ER/ PR positive HER 2 NEU NEGATIVE no complaints no mass discharge or change in the breast.  The patient is a 54 year old female.   Past Surgical History Conni Slipper, RN; 11/10/2019 8:18 AM) Breast Biopsy Right. Cataract Surgery Bilateral. Hysterectomy (not due to cancer) - Complete  Diagnostic Studies History Conni Slipper, RN; 11/10/2019 8:18 AM) Colonoscopy 1-5 years ago Mammogram within last year Pap Smear 1-5 years ago  Medication History Conni Slipper, RN; 11/10/2019 8:18 AM) Medications Reconciled  Social History Conni Slipper, RN; 11/10/2019 8:18 AM) Caffeine use Carbonated beverages. No alcohol use No drug use Tobacco use Never smoker.  Family History Conni Slipper, RN; 11/10/2019 8:18 AM) Cancer Mother.  Pregnancy / Birth History Conni Slipper, RN; 11/10/2019 8:18 AM) Age at menarche 8 years. Age of menopause <44 Contraceptive History Oral contraceptives. Gravida 1 Irregular periods Maternal age 76-35 Para 1  Other Problems Conni Slipper, RN; 11/10/2019 8:18 AM) Arthritis Breast Cancer Thyroid Disease     Review of Systems (Nemiah Kissner A. Prathik Aman MD; 11/10/2019 12:05 PM) General Not Present- Appetite Loss, Chills, Fatigue, Fever, Night Sweats, Weight Gain and Weight Loss. Skin Not Present- Change in Wart/Mole, Dryness, Hives, Jaundice, New Lesions, Non-Healing Wounds, Rash and Ulcer. HEENT Present- Hearing Loss, Ringing in the Ears, Seasonal Allergies and Wears glasses/contact lenses. Not Present- Earache, Hoarseness, Nose Bleed, Oral Ulcers, Sinus Pain, Sore Throat, Visual Disturbances and Yellow Eyes. Respiratory Not  Present- Bloody sputum, Chronic Cough, Difficulty Breathing, Snoring and Wheezing. Breast Not Present- Breast Mass, Breast Pain, Nipple Discharge and Skin Changes. Cardiovascular Not Present- Chest Pain, Difficulty Breathing Lying Down, Leg Cramps, Palpitations, Rapid Heart Rate, Shortness of Breath and Swelling of Extremities. Gastrointestinal Not Present- Abdominal Pain, Bloating, Bloody Stool, Change in Bowel Habits, Chronic diarrhea, Constipation, Difficulty Swallowing, Excessive gas, Gets full quickly at meals, Hemorrhoids, Indigestion, Nausea, Rectal Pain and Vomiting. Female Genitourinary Not Present- Frequency, Nocturia, Painful Urination, Pelvic Pain and Urgency. Musculoskeletal Not Present- Back Pain, Joint Pain, Joint Stiffness, Muscle Pain, Muscle Weakness and Swelling of Extremities. Neurological Not Present- Decreased Memory, Fainting, Headaches, Numbness, Seizures, Tingling, Tremor, Trouble walking and Weakness. Psychiatric Not Present- Anxiety, Bipolar, Change in Sleep Pattern, Depression, Fearful and Frequent crying. Endocrine Not Present- Cold Intolerance, Excessive Hunger, Hair Changes, Heat Intolerance, Hot flashes and New Diabetes. Hematology Not Present- Blood Thinners, Easy Bruising, Excessive bleeding, Gland problems, HIV and Persistent Infections. All other systems negative   Physical Exam (Alaina Donati A. Nichole Neyer MD; 11/10/2019 12:03 PM)  General Mental Status-Alert. General Appearance-Consistent with stated age. Hydration-Well hydrated. Voice-Normal.  Head and Neck Head-normocephalic, atraumatic with no lesions or palpable masses. Trachea-midline. Thyroid Gland Characteristics - normal size and consistency.  Chest and Lung Exam Chest and lung exam reveals -quiet, even and easy respiratory effort with no use of accessory muscles and on auscultation, normal breath sounds, no adventitious sounds and normal vocal resonance. Inspection Chest Wall - Normal.  Back - normal.  Breast Breast - Left-Symmetric, Non Tender, No Biopsy scars, no Dimpling - Left, No Inflammation, No Lumpectomy scars, No Mastectomy scars, No Peau d' Orange. Breast - Right-Symmetric, Non Tender, No Biopsy scars, no Dimpling - Right, No Inflammation, No Lumpectomy scars, No Mastectomy scars, No Peau  d' Orange. Breast Lump-No Palpable Breast Mass.  Cardiovascular Cardiovascular examination reveals -normal heart sounds, regular rate and rhythm with no murmurs and normal pedal pulses bilaterally.  Neurologic Neurologic evaluation reveals -alert and oriented x 3 with no impairment of recent or remote memory. Mental Status-Normal.  Musculoskeletal Normal Exam - Left-Upper Extremity Strength Normal and Lower Extremity Strength Normal. Normal Exam - Right-Upper Extremity Strength Normal and Lower Extremity Strength Normal.  Lymphatic Head & Neck  General Head & Neck Lymphatics: Bilateral - Description - Normal. Axillary  General Axillary Region: Bilateral - Description - Normal. Tenderness - Non Tender.    Assessment & Plan (Jackolyn Geron A. Jemya Depierro MD; 11/10/2019 12:04 PM)  BREAST CANCER, RIGHT (C50.911) Impression: pt opted for right breast lumpectomy and SLN mapping Needs MRI Risk of lumpectomy include bleeding, infection, seroma, more surgery, use of seed/wire, wound care, cosmetic deformity and the need for other treatments, death , blood clots, death. Pt agrees to proceed. Risk of sentinel lymph node mapping include bleeding, infection, lymphedema, shoulder pain. stiffness, dye allergy. cosmetic deformity , blood clots, death, need for more surgery. Pt agrees to proceed.  total time 45 min exam face to face discussion of surgery and complications, options  Current Plans You are being scheduled for surgery- Our schedulers will call you.  You should hear from our office's scheduling department within 5 working days about the location, date, and time of  surgery. We try to make accommodations for patient's preferences in scheduling surgery, but sometimes the OR schedule or the surgeon's schedule prevents [place] from making those accommodations.  If you have not heard from our office [phone]) in 5 working days, call the office and ask for your surgeon's nurse.  If you have other questions about your diagnosis, plan, or surgery, call the office and ask for your surgeon's nurse.  Pt Education - [facility] Breast Cancer Information Given - [facility] "Breast Journey" Package We discussed the staging and pathophysiology of breast cancer. We discussed all of the different options for treatment for breast cancer including surgery, chemotherapy, radiation therapy, Herceptin, and antiestrogen therapy. We discussed a sentinel lymph node biopsy as she does not appear to having lymph node involvement right now. We discussed the performance of that with injection of radioactive tracer and blue dye. We discussed that she would have an incision underneath her axillary hairline. We discussed that there is a bout a 10-20% chance of having a positive node with a sentinel lymph node biopsy and we will await the permanent pathology to make any other first further decisions in terms of her treatment. One of these options might be to return to the operating room to perform an axillary lymph node dissection. We discussed about a 1-2% risk lifetime of chronic shoulder pain as well as lymphedema associated with a sentinel lymph node biopsy. We discussed the options for treatment of the breast cancer which included lumpectomy versus a mastectomy. We discussed the performance of the lumpectomy with a wire placement. We discussed a 10-20% chance of a positive margin requiring reexcision in the operating room. We also discussed that she may need radiation therapy or antiestrogen therapy or both if she undergoes lumpectomy. We discussed the mastectomy and the postoperative care for that as  well. We discussed that there is no difference in her survival whether she undergoes lumpectomy with radiation therapy or antiestrogen therapy versus a mastectomy. There is a slight difference in the local recurrence rate being 3-5% with lumpectomy and about 1% with a mastectomy.  We discussed the risks of operation including bleeding, infection, possible reoperation. She understands her further therapy will be based on what her stages at the time of her operation.  Pt Education - flb breast cancer surgery: discussed with patient and provided information. Pt Education - [facility] Breast Biopsy HCI: discussed with patient and provided information. Pt Education - ABC (After Breast Cancer) Class Info: discussed with patient and provided information. Pt Education - [facility] Breast Pains Education

## 2019-11-10 NOTE — Progress Notes (Unsigned)
Per MD orders, Ativan 0.5mg  tablet take 1 tablet 1 hour prior to MRI may repeat once if needed #2, 0 RF called into [facility] pharmacy.

## 2019-11-10 NOTE — Progress Notes (Signed)
REFERRING PROVIDER: Chauncey Cruel, MD [address] [place],  [place] [address]  PRIMARY PROVIDER:  Binnie Rail, MD  PRIMARY REASON FOR VISIT:  1. Malignant neoplasm of upper-outer quadrant of right breast in female, estrogen receptor positive ([place])   2. Family history of stomach cancer   3. Family history of uterine cancer   4. Family history of colon cancer   5. Family history of melanoma   6. Family history of pancreatic cancer   7. Family history of lung cancer    I connected with Alexis Young on 11/10/2019 at 11:40 AM EDT by Webex and verified that I am speaking with the correct person using two identifiers.    Patient location: [facility] Provider location: [facility]  HISTORY OF PRESENT ILLNESS:   Alexis Young, a 54 y.o. female, was seen for a [facility] cancer genetics consultation at the request of Dr. Jana Hakim due to a personal and family history of cancer.  Alexis Young presents to clinic today to discuss the possibility of a hereditary predisposition to cancer, genetic testing, and to further clarify her future cancer risks, as well as potential cancer risks for family members.   In 2021, at the age of 49, Alexis Young was diagnosed with invasive lobular carcinoma of the right breast, ER/PR+, HER2-. The treatment plan includes surgery (lumpectomy), possible chemotherapy, adjuvant radiation therapy and adjuvant antiestrogens. Alexis Young is happy with her decision to have lumpectomy and does not feel genetic test results would change her surgical plan.   Alexis Young also had two melanomas, one on each arm, removed in 2014.   CANCER HISTORY:  Oncology History   No history exists.     RISK FACTORS:  Menarche was at age 30.  First live birth at age 24.  OCP use for approximately 14 years.  Ovaries intact: no.  Hysterectomy: yes.  Menopausal status: postmenopausal.  HRT use: 12 years. Colonoscopy: yes; normal. Mammogram within the last year:  yes. Up to date with pelvic exams: yes. Any excessive radiation exposure in the past: no  Past Medical History:  Diagnosis Date  . Arthritis   . 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 Dyspareunia   . Elevated serum creatinine   . Family history of colon cancer   . Family history of lung cancer   . Family history of melanoma   . Family history of pancreatic cancer   . Family history of stomach cancer   . Family history of uterine cancer   . Fibroid   . GERD (gastroesophageal reflux disease)    takes protonix as needed   . 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 Tennis elbow 2017   Right  . Tinnitus 2020   both ears    Past Surgical History:  Procedure Laterality Date  . BILATERAL SALPINGECTOMY Bilateral 02/14/2015   Procedure: BILATERAL SALPINGECTOMY OOPHORECTOMY;  Surgeon: Nunzio Cobbs, MD;  Location: [facility];  Service: Gynecology;  Laterality: Bilateral;  . BREAST BIOPSY  1987   Dr. Rosana Hoes  . BREAST EXCISIONAL BIOPSY Left    benign  . BREAST SURGERY Right 04/2010   breast biopsy for multiple fibroadenoma - no atypia  . CATARACT EXTRACTION, BILATERAL    . CESAREAN SECTION    . CRYOTHERAPY  5/91   for persistent condyloma atypia  . CYSTOSCOPY N/A 02/14/2015   Procedure: CYSTOSCOPY;  Surgeon: Vicenta Dunning  Quincy Simmonds, MD;  Location: [facility];  Service: Gynecology;  Laterality: N/A;  . LASER ABLATION OF CONDYLOMAS  03/05/90 Dr. 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 MELANOMA EXCISION Right 03/2013   melanoma right arm  . ROBOTIC ASSISTED TOTAL HYSTERECTOMY N/A 02/14/2015   Procedure: ROBOTIC ASSISTED TOTAL HYSTERECTOMY;  Surgeon: Nunzio Cobbs, MD;  Location: [facility];  Service: Gynecology;  Laterality: N/A;  . SKIN CANCER EXCISION  06/2013   midddle of back with path report of atypia  . TUBAL LIGATION Bilateral   . WISDOM TOOTH EXTRACTION      Social History   Socioeconomic History   . Marital status: Married    Spouse name: Not on file  . Number of children: 1  . Years of education: Not on file  . Highest education level: Not on file  Occupational History    Employer: [employer]  Tobacco Use  . Smoking status: Never Smoker  . Smokeless tobacco: Never Used  Substance and Sexual Activity  . Alcohol use: No  . Drug use: No  . Sexual activity: Yes    Partners: Male    Birth control/protection: Surgical    Comment: Hysterectomy  Other Topics Concern  . Not on file  Social History Narrative  . Not on file   Social Determinants of Health   Financial Resource Strain:   . Difficulty of Paying Living Expenses:   Food Insecurity:   . Worried About Charity fundraiser in the Last Year:   . Arboriculturist in the Last Year:   Transportation Needs:   . Film/video editor (Medical):   Marland Kitchen Lack of Transportation (Non-Medical):   Physical Activity:   . Days of Exercise per Week:   . Minutes of Exercise per Session:   Stress:   . Feeling of Stress :   Social Connections:   . Frequency of Communication with Friends and Family:   . Frequency of Social Gatherings with Friends and Family:   . Attends Religious Services:   . Active Member of Clubs or Organizations:   . Attends Archivist Meetings:   Marland Kitchen Marital Status:      FAMILY HISTORY:  We obtained a detailed, 4-generation family history.  Significant diagnoses are listed below: Family History  Problem Relation Age of Onset  . Cancer Mother        lung and liver  . Alcohol abuse Father        Cirrhosis of liver  . Cancer Maternal Aunt        stomach cancer  . Cancer Maternal Grandmother        uterine cancer  . Heart failure Paternal Grandmother   . Lung cancer Maternal Uncle   . Lung cancer Maternal Uncle   . Pancreatic cancer Paternal Uncle   . Testicular cancer Cousin   . Melanoma Cousin        of eye  . Cancer Cousin        possible colon? dx 41s  . Colon cancer Other    . Breast cancer Neg Hx    Alexis Young has one son, 61, no history of cancer. Patient does not have siblings.  Alexis Young mother was diagnosed with lung cancer at 4 and died at 46, she had history of smoking. Patient had 3 maternal uncles and 5 maternal aunts. Two uncles also had lung cancer and have passed away, both diagnosed in their 44s. An  aunt had stomach cancer at 68 and is deceased. Patient has 1 maternal cousin who had testicular cancer, melanoma in his eye, and colon cancer and he is currently 14. Maternal grandmother had uterine cancer in her 42s. This grandmother had a sister who had colon cancer. Maternal grandfather died by suicide in his 26s.  Alexis Young father died at 82. Patient had 3 paternal uncles and 1 paternal aunt. One uncle had pancreatic cancer. A paternal cousin had cancer but is unsure the type. Paternal grandparents both passed in their 50s.   Alexis Young is unaware of previous family history of genetic testing for hereditary cancer risks. Patient's maternal ancestors are of unknown descent, and paternal ancestors are of unknown descent. There is no reported Ashkenazi Jewish ancestry. There is no known consanguinity.  GENETIC COUNSELING ASSESSMENT: Alexis Young is a 54 y.o. female with a personal and family history which is somewhat suggestive of a hereditary cancer syndrome such as Lynch syndrome and predisposition to cancer. We, therefore, discussed and recommended the following at today's visit.   DISCUSSION: We discussed that 5 - 10% of breast cancer is hereditary, with most cases associated with BRCA1/BRCA2 mutations.  There are other genes that can be associated with hereditary breast cancer syndromes.  Her family history is not consistent with a BRCA1/BRCA2 mutation but is somewhat suggestive of Lynch syndrome which we discussed. We discussed that testing is beneficial for several reasons including surgical decision-making for breast cancer,  knowing how to follow individuals after completing their treatment, and understand if other family members could be at risk for cancer and allow them to undergo genetic testing.   We reviewed the characteristics, features and inheritance patterns of hereditary cancer syndromes. We also discussed genetic testing, including the appropriate family members to test, the process of testing, insurance coverage and turn-around-time for results. We discussed the implications of a negative, positive and/or variant of uncertain significant result. We recommended Alexis Young pursue genetic testing for the Common Hereditary Cancers  gene panel.   The Common Hereditary Cancers Panel offered by [facility] includes sequencing and/or deletion duplication testing of the following 48 genes: APC, ATM, AXIN2, BARD1, BMPR1A, BRCA1, BRCA2, BRIP1, CDH1, CDKN2A (p14ARF), CDKN2A (p16INK4a), CKD4, CHEK2, CTNNA1, DICER1, EPCAM (Deletion/duplication testing only), GREM1 (promoter region deletion/duplication testing only), KIT, MEN1, MLH1, MSH2, MSH3, MSH6, MUTYH, NBN, NF1, NHTL1, PALB2, PDGFRA, PMS2, POLD1, POLE, PTEN, RAD50, RAD51C, RAD51D, RNF43, SDHB, SDHC, SDHD, SMAD4, SMARCA4. STK11, TP53, TSC1, TSC2, and VHL.  The following genes were evaluated for sequence changes only: SDHA and HOXB13 c.251G>A variant only.  Based on Alexis Young personal and family history of cancer, she meets medical criteria for genetic testing. Despite that she meets criteria, she may still have an out of pocket cost. We discussed that if her out of pocket cost for testing is over $100, the laboratory will call and confirm whether she wants to proceed with testing.  If the out of pocket cost of testing is less than $100 she will be billed by the genetic testing laboratory.   PLAN: After considering the risks, benefits, and limitations, Alexis Young provided informed consent to pursue genetic testing and the blood sample was sent to [facility]  for analysis of the [facility] Common Hereditary Cancers. Results should be available within approximately 2-3 weeks' time, at which point they will be disclosed by telephone to Alexis Young, as will any additional recommendations warranted by these results. Alexis Young will receive a summary of her genetic counseling visit and  a copy of her results once available. This information will also be available in Epic.   Alexis Young questions were answered to her satisfaction today. Our contact information was provided should additional questions or concerns arise. Thank you for the referral and allowing [place] to share in the care of your patient.   Faith Rogue, MS, [facility] Genetic Counselor [email or web address] Phone: [phone]  The patient was seen for a total of 20 minutes in face-to-face genetic counseling. Patient's husband was also present.  Drs. Magrinat, Lindi Adie and/or Burr Medico were available for discussion regarding this case.   _______________________________________________________________________ For Office Staff:  Number of people involved in session: 2 Was an Intern/ student involved with case: no

## 2019-11-11 ENCOUNTER — Telehealth: Payer: Self-pay | Admitting: Oncology

## 2019-11-11 NOTE — Telephone Encounter (Signed)
Scheduled appts per 4/14 los. Pt confirmed appt date and time.

## 2019-11-12 ENCOUNTER — Other Ambulatory Visit: Payer: Self-pay | Admitting: Surgery

## 2019-11-12 ENCOUNTER — Telehealth: Payer: Self-pay | Admitting: Obstetrics and Gynecology

## 2019-11-12 DIAGNOSIS — C50312 Malignant neoplasm of lower-inner quadrant of left female breast: Secondary | ICD-10-CM

## 2019-11-12 NOTE — Telephone Encounter (Signed)
Call to patient. Advised of message from Dr Quincy Simmonds. Patient appreciative of call. She has stopped estradiol. Advised to call if desires treatment for menopausal issues.  Encounter closed.

## 2019-11-12 NOTE — Telephone Encounter (Signed)
Please contact patient in follow up to her new diagnosis of breast cancer.   I would like her to know that I am following along with her and wish her well in her treatment process.   She is in the hands of a great medical and surgical team.   If she has not already stopped her estradiol tablet, I recommend she do so now.  Please also discontinue this medication from her medication list.   If she has hot flashes that need treatment, there are nonhormonal options that either her oncology team or I can prescribe.

## 2019-11-18 ENCOUNTER — Encounter: Payer: Self-pay | Admitting: *Deleted

## 2019-11-18 ENCOUNTER — Telehealth: Payer: Self-pay | Admitting: *Deleted

## 2019-11-18 NOTE — Telephone Encounter (Signed)
Spoke with patient to follow up from [facility] and to assess navigation needs. No questions or concerns at this time. 

## 2019-11-19 ENCOUNTER — Telehealth: Payer: Self-pay | Admitting: Licensed Clinical Social Worker

## 2019-11-19 ENCOUNTER — Other Ambulatory Visit: Payer: Self-pay

## 2019-11-19 ENCOUNTER — Ambulatory Visit (HOSPITAL_COMMUNITY)
Admission: RE | Admit: 2019-11-19 | Discharge: 2019-11-19 | Disposition: A | Discharge status: Home / Self Care | Payer: BC Managed Care – PPO | Source: Ambulatory Visit | Attending: Surgery | Admitting: Surgery

## 2019-11-19 ENCOUNTER — Ambulatory Visit: Payer: Self-pay | Admitting: Licensed Clinical Social Worker

## 2019-11-19 ENCOUNTER — Encounter: Payer: Self-pay | Admitting: Licensed Clinical Social Worker

## 2019-11-19 DIAGNOSIS — Z170 Estrogen receptor positive status [ER+]: Secondary | ICD-10-CM | POA: Diagnosis present

## 2019-11-19 DIAGNOSIS — C50411 Malignant neoplasm of upper-outer quadrant of right female breast: Secondary | ICD-10-CM | POA: Insufficient documentation

## 2019-11-19 DIAGNOSIS — Z1379 Encounter for other screening for genetic and chromosomal anomalies: Secondary | ICD-10-CM | POA: Insufficient documentation

## 2019-11-19 DIAGNOSIS — Z801 Family history of malignant neoplasm of trachea, bronchus and lung: Secondary | ICD-10-CM

## 2019-11-19 DIAGNOSIS — Z800 Family history of malignant neoplasm of digestive organs: Secondary | ICD-10-CM

## 2019-11-19 DIAGNOSIS — Z808 Family history of malignant neoplasm of other organs or systems: Secondary | ICD-10-CM

## 2019-11-19 DIAGNOSIS — Z8049 Family history of malignant neoplasm of other genital organs: Secondary | ICD-10-CM

## 2019-11-19 MED ORDER — GADOBUTROL 1 MMOL/ML IV SOLN
7.5000 mL | Freq: Once | INTRAVENOUS | Status: AC | PRN
  Administered 2019-11-19: 7.5 mL via INTRAVENOUS

## 2019-11-19 NOTE — Progress Notes (Addendum)
HPI:  Ms. Gootee was previously seen in the [place] clinic due to a personal and family history of cancer and concerns regarding a hereditary predisposition to cancer. Please refer to our prior cancer genetics clinic note for more information regarding our discussion, assessment and recommendations, at the time. Ms. Begnaud recent genetic test results were disclosed to her, as were recommendations warranted by these results. These results and recommendations are discussed in more detail below.  CANCER HISTORY:  Oncology History  Malignant neoplasm of upper-outer quadrant of right breast in female, estrogen receptor positive ([place])  11/04/2019 Initial Diagnosis   Malignant neoplasm of upper-outer quadrant of right breast in female, estrogen receptor positive ([place])    Genetic Testing   Negative genetic testing. No pathogenic variants identified on the [facility] Common Hereditary Cancers + Melanoma panel. VUS in APC, POLE, and RNF43 identified. The report date is 11/19/2019.  The Common Hereditary Cancers Panel + Melanoma panel offered by [facility] includes sequencing and/or deletion duplication testing of the following 53 genes: APC*, ATM*, AXIN2, BAP1, BARD1, BMPR1A, BRCA1, BRCA2, BRIP1, CDH1, CDK4, CDKN2A (p14ARF), CDKN2A (p16INK4a), CHEK2, CTNNA1, DICER1*, EGFR, EPCAM*, GREM1*, HOXB13, KIT, MEN1*, MITF*, MLH1*, MSH2*, MSH3*, MSH6*, MUTYH, NBN, NF1*, NTHL1, PALB2, PDGFRA, PMS2*, POLD1*, POLE, POT1, PTEN*, RAD50, RAD51C, RAD51D, RB1*, RNF43, SDHA*, SDHB, SDHC*, SDHD, SMAD4, SMARCA4, STK11, TP53, TSC1*, TSC2, VHL.     FAMILY HISTORY:  We obtained a detailed, 4-generation family history.  Significant diagnoses are listed below: Family History  Problem Relation Age of Onset  . Cancer Mother        lung and liver  . Alcohol abuse Father        Cirrhosis of liver  . Cancer Maternal Aunt        stomach cancer  . Cancer Maternal Grandmother        uterine cancer  . Heart  failure Paternal Grandmother   . Lung cancer Maternal Uncle   . Lung cancer Maternal Uncle   . Pancreatic cancer Paternal Uncle   . Testicular cancer Cousin   . Melanoma Cousin        of eye  . Cancer Cousin        possible colon? dx 47s  . Colon cancer Other   . Breast cancer Neg Hx     Ms. Squier has one son, 45, no history of cancer. Patient does not have siblings.  Ms. Hickam's mother was diagnosed with lung cancer at 58 and died at 61, she had history of smoking. Patient had 3 maternal uncles and 5 maternal aunts. Two uncles also had lung cancer and have passed away, both diagnosed in their 79s. An aunt had stomach cancer at 46 and is deceased. Patient has 1 maternal cousin who had testicular cancer, melanoma in his eye, and colon cancer and he is currently 53. Maternal grandmother had uterine cancer in her 61s. This grandmother had a sister who had colon cancer. Maternal grandfather died by suicide in his 84s.  Ms. Cotterill's father died at 1. Patient had 3 paternal uncles and 1 paternal aunt. One uncle had pancreatic cancer. A paternal cousin had cancer but is unsure the type. Paternal grandparents both passed in their 16s.   Ms. Genao is unaware of previous family history of genetic testing for hereditary cancer risks. Patient's maternal ancestors are of unknown descent, and paternal ancestors are of unknown descent. There is no reported Ashkenazi Jewish ancestry. There is no known consanguinity.  GENETIC TEST RESULTS: Genetic testing  reported out on 11/19/2019 through the [facility] Common Hereditary Cancers Panel + Melanoma  panel found no pathogenic mutations.   The Common Hereditary Cancers Panel + Melanoma panel offered by [facility] includes sequencing and/or deletion duplication testing of the following 53 genes: APC*, ATM*, AXIN2, BAP1, BARD1, BMPR1A, BRCA1, BRCA2, BRIP1, CDH1, CDK4, CDKN2A (p14ARF), CDKN2A (p16INK4a), CHEK2, CTNNA1, DICER1*, EGFR, EPCAM*, GREM1*,  HOXB13, KIT, MEN1*, MITF*, MLH1*, MSH2*, MSH3*, MSH6*, MUTYH, NBN, NF1*, NTHL1, PALB2, PDGFRA, PMS2*, POLD1*, POLE, POT1, PTEN*, RAD50, RAD51C, RAD51D, RB1*, RNF43, SDHA*, SDHB, SDHC*, SDHD, SMAD4, SMARCA4, STK11, TP53, TSC1*, TSC2, VHL.  The test report has been scanned into EPIC and is located under the Molecular Pathology section of the Results Review tab.  A portion of the result report is included below for reference.     We discussed with Ms. Bhagat that because current genetic testing is not perfect, it is possible there may be a gene mutation in one of these genes that current testing cannot detect, but that chance is small.  We also discussed, that there could be another gene that has not yet been discovered, or that we have not yet tested, that is responsible for the cancer diagnoses in the family. It is also possible there is a hereditary cause for the cancer in the family that Ms. Hulme did not inherit and therefore was not identified in her testing.  Therefore, it is important to remain in touch with cancer genetics in the future so that we can continue to offer Ms. Gimbel the most up to date genetic testing.   Genetic testing did identify 3 Variants of uncertain significance (VUS) - one in the APC gene  a second in the POLE gene and a third in the RNF43 gene.  At this time, it is unknown if these variants are associated with increased cancer risk or if they are normal findings, but most variants such as these get reclassified to being inconsequential. They should not be used to make medical management decisions. With time, we suspect the lab will determine the significance of these variants, if any. If we do learn more about them, we will try to contact Ms. Gillette to discuss it further. However, it is important to stay in touch with [place] periodically and keep the address and phone number up to date  ADDITIONAL GENETIC TESTING: We discussed with Ms. Keisling that her genetic  testing was fairly extensive.  If there are genes identified to increase cancer risk that can be analyzed in the future, we would be happy to discuss and coordinate this testing at that time.    CANCER SCREENING RECOMMENDATIONS: Ms. Milles's test result is considered negative (normal).  This means that we have not identified a hereditary cause for her  personal and family history of cancer at this time. Most cancers happen by chance and this negative test suggests that her cancer may fall into this category.    While reassuring, this does not definitively rule out a hereditary predisposition to cancer. It is still possible that there could be genetic mutations that are undetectable by current technology. There could be genetic mutations in genes that have not been tested or identified to increase cancer risk.  Therefore, it is recommended she continue to follow the cancer management and screening guidelines provided by her oncology and primary healthcare provider.   An individual's cancer risk and medical management are not determined by genetic test results alone. Overall cancer risk assessment incorporates additional factors, including personal medical history,  family history, and any available genetic information that may result in a personalized plan for cancer prevention and surveillance.  RECOMMENDATIONS FOR FAMILY MEMBERS:  Relatives in this family might be at some increased risk of developing cancer, over the general population risk, simply due to the family history of cancer.  We recommended female relatives in this family have a yearly mammogram beginning at age 59, or 38 years younger than the earliest onset of cancer, an annual clinical breast exam, and perform monthly breast self-exams. Female relatives in this family should also have a gynecological exam as recommended by their primary provider. All family members should have a colonoscopy by age 33, or as directed by their physicians.    It is also possible there is a hereditary cause for the cancer in Ms. Winget's family that she did not inherit and therefore was not identified in her.  Based on Ms. Leeder's family history, we recommended those related to her uncle who had pancreatic cancer have genetic counseling and testing. Those on her maternal side could also consider genetic testing given the history of endometrial, stomach and colon cancer.  Ms. Starn will let us know if we can be of any assistance in coordinating genetic counseling and/or testing for these family members.  FOLLOW-UP: Lastly, we discussed with Ms. Ocallaghan that cancer genetics is a rapidly advancing field and it is possible that new genetic tests will be appropriate for her and/or her family members in the future. We encouraged her to remain in contact with cancer genetics on an annual basis so we can update her personal and family histories and let her know of advances in cancer genetics that may benefit this family.   Our contact number was provided. Ms. Rufer's questions were answered to her satisfaction, and she knows she is welcome to call us at anytime with additional questions or concerns.   Faith Rogue, MS, [facility] Genetic Counselor [email or web address] Phone: [phone]

## 2019-11-19 NOTE — Telephone Encounter (Signed)
Revealed negative genetic testing.  Revealed that a VUS in APC, POLE, and RNF43 were identified.  We discussed that we do not know why she has breast cancer or why there is cancer in the family. It could be due to a different gene that we are not testing, or something our current technology cannot pick up.  It will be important for her to keep in contact with genetics to learn if additional testing may be needed in the future.

## 2019-11-22 ENCOUNTER — Encounter: Payer: Self-pay | Admitting: *Deleted

## 2019-11-23 ENCOUNTER — Other Ambulatory Visit: Payer: Self-pay | Admitting: Surgery

## 2019-11-23 DIAGNOSIS — R928 Other abnormal and inconclusive findings on diagnostic imaging of breast: Secondary | ICD-10-CM

## 2019-11-24 ENCOUNTER — Encounter: Payer: Self-pay | Admitting: *Deleted

## 2019-11-24 ENCOUNTER — Telehealth: Payer: Self-pay

## 2019-11-24 NOTE — Telephone Encounter (Signed)
Nutrition Assessment  Reason for Assessment:  Pt attended Breast Clinic on 11/10/19 and was given nutrition packet by nurse navigator.    ASSESSMENT:  54 year old female with new diagnosis of breast cancer.  Past medical history reviewed.  Planning lumpectomy with oncotype and adjuvant radiation antiestrogens  Spoke with patient via phone to introduce self and service at [facility].  Patient reports that recently appetite has decreased and feels nauseated at bedtime but thinks that is due to the news of cancer.  Medications:  Vit d  Labs: reviewed  Anthropometrics:   Height: 65 inches Weight: 188 lb BMI: 31   NUTRITION DIAGNOSIS: Food and nutrition related knowledge deficit related to new diagnosis of breast cancer as evidenced by no prior need for nutrition related information.  INTERVENTION:   Discussed briefly packet of information regarding nutritional tips for breast cancer patients.  Questions answered.  Teachback method used.  Contact information provided and patient knows to contact me with questions/concerns.    MONITORING, EVALUATION, and GOAL: Pt will consume a healthy plant based diet to maintain lean body mass throughout treatment.   Kalysta Kneisley B. Zenia Resides, [place], [place] Registered Dietitian [phone] (pager)

## 2019-11-25 ENCOUNTER — Encounter: Payer: Self-pay | Admitting: *Deleted

## 2019-11-30 ENCOUNTER — Encounter: Payer: Self-pay | Admitting: *Deleted

## 2019-12-02 ENCOUNTER — Ambulatory Visit
Admission: RE | Admit: 2019-12-02 | Discharge: 2019-12-02 | Disposition: A | Discharge status: Home / Self Care | Payer: BC Managed Care – PPO | Source: Ambulatory Visit | Attending: Surgery | Admitting: Surgery

## 2019-12-02 ENCOUNTER — Other Ambulatory Visit: Payer: Self-pay

## 2019-12-02 ENCOUNTER — Other Ambulatory Visit: Payer: Self-pay | Admitting: Body Imaging

## 2019-12-02 DIAGNOSIS — R928 Other abnormal and inconclusive findings on diagnostic imaging of breast: Secondary | ICD-10-CM

## 2019-12-02 MED ORDER — GADOBUTROL 1 MMOL/ML IV SOLN
7.0000 mL | Freq: Once | INTRAVENOUS | Status: AC | PRN
  Administered 2019-12-02: 7 mL via INTRAVENOUS

## 2019-12-03 ENCOUNTER — Ambulatory Visit: Payer: Self-pay | Admitting: Surgery

## 2019-12-03 ENCOUNTER — Other Ambulatory Visit (HOSPITAL_COMMUNITY): Payer: BC Managed Care – PPO

## 2019-12-03 ENCOUNTER — Encounter: Payer: Self-pay | Admitting: *Deleted

## 2019-12-03 DIAGNOSIS — C50912 Malignant neoplasm of unspecified site of left female breast: Secondary | ICD-10-CM

## 2019-12-06 ENCOUNTER — Ambulatory Visit: Payer: Self-pay | Admitting: Surgery

## 2019-12-06 DIAGNOSIS — C50911 Malignant neoplasm of unspecified site of right female breast: Secondary | ICD-10-CM

## 2019-12-06 NOTE — H&P (View-Only) (Signed)
Alexis Young Appointment: 12/06/2019 2:20 PM Location: [facility] Patient #: [id number] DOB: 1965-10-13 Married / Language: English / Race: White Female  History of Present Illness Alexis Moores A. Dare Sanger MD; 12/06/2019 2:58 PM) Patient words: Patient returns to discuss the magnetic resonance imaging findings. 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 She is bruised but okay after biopsy.  The patient is a 54 year old female.   Allergies Emeline Gins, [place]; 12/06/2019 2:31 PM) Tetracyclines & Related Nausea. Aspirin *ANALGESICS - NonNarcotic* Nausea, Vomiting, Rash. Sulfa Antibiotics Rash. Allergies Reconciled  Medication History Emeline Gins, CMA; 12/06/2019 2:31 PM) Synthroid (50MCG Tablet, Oral) Active. Medications Reconciled    Vitals Emeline Gins CMA; 12/06/2019 2:31 PM) 12/06/2019 2:31 PM Weight: 186.4 lb Height: 65in Body Surface Area: 1.92 m Body Mass Index: 31.02 kg/m  Temp.: 98.42F  Pulse: 78 (Regular)  BP: 132/82(Sitting, Left Arm, Standard)        Physical Exam (Skylin Kennerson A. Aylinn Rydberg MD; 12/06/2019 2:56 PM)  General Mental Status-Alert. General Appearance-Consistent with stated age. Hydration-Well hydrated. Voice-Normal.  Breast Note: bruising on the right left with minimal bruising  Neurologic Neurologic evaluation reveals -alert and oriented x 3 with no impairment of recent or remote memory. Mental Status-Normal.    Assessment & Plan (Mitzi Lilja A. Emma-Lee Oddo MD; 12/06/2019 2:57 PM)  BREAST CANCER, RIGHT (C50.911) Impression: Patient opted for placement of seeds to remove the areas of atypical ductal hyperplasia right breast cancer. Plan on right lumpectomy 3 and right axillary sentinel lymph node mapping is planned before. Risk of lumpectomy include bleeding, infection, seroma, more surgery, use of seed/wire,  wound care, cosmetic deformity and the need for other treatments, death , blood clots, death. Pt agrees to proceed. Risk of sentinel lymph node mapping include bleeding, infection, lymphedema, shoulder pain. stiffness, dye allergy. cosmetic deformity , blood clots, death, need for more surgery. Pt agrees to proceed.    total time 30 min  Current Plans Pt Education - [facility] breast cancer surgery: discussed with patient and provided information. We discussed the staging and pathophysiology of breast cancer. We discussed all of the different options for treatment for breast cancer including surgery, chemotherapy, radiation therapy, Herceptin, and antiestrogen therapy. We discussed a sentinel lymph node biopsy as she does not appear to having lymph node involvement right now. We discussed the performance of that with injection of radioactive tracer and blue dye. We discussed that she would have an incision underneath her axillary hairline. We discussed that there is a bout a 10-20% chance of having a positive node with a sentinel lymph node biopsy and we will await the permanent pathology to make any other first further decisions in terms of her treatment. One of these options might be to return to the operating room to perform an axillary lymph node dissection. We discussed about a 1-2% risk lifetime of chronic shoulder pain as well as lymphedema associated with a sentinel lymph node biopsy. We discussed the options for treatment of the breast cancer which included lumpectomy versus a mastectomy. We discussed the performance of the lumpectomy with a wire placement. We discussed a 10-20% chance of a positive margin requiring reexcision in the operating room. We also discussed that she may need radiation therapy or antiestrogen therapy or both if she undergoes lumpectomy. We discussed the mastectomy and the postoperative care for that as well. We discussed that there is no difference in her  survival whether she  undergoes lumpectomy with radiation therapy or antiestrogen therapy versus a mastectomy. There is a slight difference in the local recurrence rate being 3-5% with lumpectomy and about 1% with a mastectomy. We discussed the risks of operation including bleeding, infection, possible reoperation. She understands her further therapy will be based on what her stages at the time of her operation.  You are being scheduled for surgery- Our schedulers will call you.  You should hear from our office's scheduling department within 5 working days about the location, date, and time of surgery. We try to make accommodations for patient's preferences in scheduling surgery, but sometimes the OR schedule or the surgeon's schedule prevents [place] from making those accommodations.  If you have not heard from our office [phone]) in 5 working days, call the office and ask for your surgeon's nurse.  If you have other questions about your diagnosis, plan, or surgery, call the office and ask for your surgeon's nurse.  Pt Education - [facility] Breast Biopsy HCI: discussed with patient and provided information.

## 2019-12-06 NOTE — H&P (Signed)
Alexis Young Appointment: 12/06/2019 2:20 PM Location: [facility] Patient #: [id number] DOB: Jul 09, 1966 Married / Language: English / Race: White Female  History of Present Illness Alexis Moores A. Shakeel Disney MD; 12/06/2019 2:58 PM) Patient words: Patient returns to discuss the magnetic resonance imaging findings. 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 She is bruised but okay after biopsy.  The patient is a 54 year old female.   Allergies Emeline Gins, [place]; 12/06/2019 2:31 PM) Tetracyclines & Related Nausea. Aspirin *ANALGESICS - NonNarcotic* Nausea, Vomiting, Rash. Sulfa Antibiotics Rash. Allergies Reconciled  Medication History Emeline Gins, CMA; 12/06/2019 2:31 PM) Synthroid (50MCG Tablet, Oral) Active. Medications Reconciled    Vitals Emeline Gins CMA; 12/06/2019 2:31 PM) 12/06/2019 2:31 PM Weight: 186.4 lb Height: 65in Body Surface Area: 1.92 m Body Mass Index: 31.02 kg/m  Temp.: 98.97F  Pulse: 78 (Regular)  BP: 132/82(Sitting, Left Arm, Standard)        Physical Exam (Garnet Chatmon A. Victorina Kable MD; 12/06/2019 2:56 PM)  General Mental Status-Alert. General Appearance-Consistent with stated age. Hydration-Well hydrated. Voice-Normal.  Breast Note: bruising on the right left with minimal bruising  Neurologic Neurologic evaluation reveals -alert and oriented x 3 with no impairment of recent or remote memory. Mental Status-Normal.    Assessment & Plan (Renn Dirocco A. Amarie Viles MD; 12/06/2019 2:57 PM)  BREAST CANCER, RIGHT (C50.911) Impression: Patient opted for placement of seeds to remove the areas of atypical ductal hyperplasia right breast cancer. Plan on right lumpectomy 3 and right axillary sentinel lymph node mapping is planned before. Risk of lumpectomy include bleeding, infection, seroma, more surgery, use of seed/wire,  wound care, cosmetic deformity and the need for other treatments, death , blood clots, death. Pt agrees to proceed. Risk of sentinel lymph node mapping include bleeding, infection, lymphedema, shoulder pain. stiffness, dye allergy. cosmetic deformity , blood clots, death, need for more surgery. Pt agrees to proceed.    total time 30 min  Current Plans Pt Education - [facility]b breast cancer surgery: discussed with patient and provided information. We discussed the staging and pathophysiology of breast cancer. We discussed all of the different options for treatment for breast cancer including surgery, chemotherapy, radiation therapy, Herceptin, and antiestrogen therapy. We discussed a sentinel lymph node biopsy as she does not appear to having lymph node involvement right now. We discussed the performance of that with injection of radioactive tracer and blue dye. We discussed that she would have an incision underneath her axillary hairline. We discussed that there is a bout a 10-20% chance of having a positive node with a sentinel lymph node biopsy and we will await the permanent pathology to make any other first further decisions in terms of her treatment. One of these options might be to return to the operating room to perform an axillary lymph node dissection. We discussed about a 1-2% risk lifetime of chronic shoulder pain as well as lymphedema associated with a sentinel lymph node biopsy. We discussed the options for treatment of the breast cancer which included lumpectomy versus a mastectomy. We discussed the performance of the lumpectomy with a wire placement. We discussed a 10-20% chance of a positive margin requiring reexcision in the operating room. We also discussed that she may need radiation therapy or antiestrogen therapy or both if she undergoes lumpectomy. We discussed the mastectomy and the postoperative care for that as well. We discussed that there is no difference in her  survival whether she  undergoes lumpectomy with radiation therapy or antiestrogen therapy versus a mastectomy. There is a slight difference in the local recurrence rate being 3-5% with lumpectomy and about 1% with a mastectomy. We discussed the risks of operation including bleeding, infection, possible reoperation. She understands her further therapy will be based on what her stages at the time of her operation.  You are being scheduled for surgery- Our schedulers will call you.  You should hear from our office's scheduling department within 5 working days about the location, date, and time of surgery. We try to make accommodations for patient's preferences in scheduling surgery, but sometimes the OR schedule or the surgeon's schedule prevents [place] from making those accommodations.  If you have not heard from our office [phone]) in 5 working days, call the office and ask for your surgeon's nurse.  If you have other questions about your diagnosis, plan, or surgery, call the office and ask for your surgeon's nurse.  Pt Education - [facility] Breast Biopsy HCI: discussed with patient and provided information.

## 2019-12-07 ENCOUNTER — Other Ambulatory Visit: Payer: Self-pay | Admitting: Surgery

## 2019-12-07 ENCOUNTER — Other Ambulatory Visit (HOSPITAL_COMMUNITY): Payer: BC Managed Care – PPO

## 2019-12-07 ENCOUNTER — Encounter (HOSPITAL_BASED_OUTPATIENT_CLINIC_OR_DEPARTMENT_OTHER): Admission: RE | Payer: Self-pay | Source: Home / Self Care

## 2019-12-07 ENCOUNTER — Ambulatory Visit (HOSPITAL_BASED_OUTPATIENT_CLINIC_OR_DEPARTMENT_OTHER): Admission: RE | Admit: 2019-12-07 | Payer: BC Managed Care – PPO | Source: Home / Self Care | Admitting: Surgery

## 2019-12-07 DIAGNOSIS — C50911 Malignant neoplasm of unspecified site of right female breast: Secondary | ICD-10-CM

## 2019-12-07 SURGERY — BREAST LUMPECTOMY WITH RADIOACTIVE SEED AND SENTINEL LYMPH NODE BIOPSY
Anesthesia: General | Site: Breast | Laterality: Right

## 2019-12-08 ENCOUNTER — Other Ambulatory Visit: Payer: BC Managed Care – PPO

## 2019-12-09 ENCOUNTER — Encounter: Payer: Self-pay | Admitting: *Deleted

## 2019-12-13 NOTE — Progress Notes (Signed)
Your procedure is scheduled on Thursday May 27.  Report to [facility] Main Entrance "A" at 05:30 A.M., and check in at the Admitting office.  Call this number if you have problems the morning of surgery: [phone]  Call [phone] if you have any questions prior to your surgery date Monday-Friday 8am-4pm   Remember: Do not eat or drink after midnight the night before your surgery    Take these medicines the morning of surgery with A SIP OF WATER: levothyroxine (SYNTHROID)  As of today, STOP taking any Aspirin (unless otherwise instructed by your surgeon), Aleve, Naproxen, Ibuprofen, Motrin, Advil, Goody's, BC's, all herbal medications, fish oil, and all vitamins.    The Morning of Surgery  Do not wear jewelry, make-up or nail polish.  Do not wear lotions, powders, or perfumes, or deodorant  Do not shave 48 hours prior to surgery.    Do not bring valuables to the hospital.  [facility] is not responsible for any belongings or valuables.  If you are a smoker, DO NOT Smoke 24 hours prior to surgery  If you wear a CPAP at night please bring your mask the morning of surgery   Remember that you must have someone to transport you home after your surgery, and remain with you for 24 hours if you are discharged the same day.   Please bring cases for contacts, glasses, hearing aids, dentures or bridgework because it cannot be worn into surgery.    Leave your suitcase in the car.  After surgery it may be brought to your room.  For patients admitted to the hospital, discharge time will be determined by your treatment team.  Patients discharged the day of surgery will not be allowed to drive home.    Special instructions:   [place]- Preparing For Surgery  Before surgery, you can play an important role. Because skin is not sterile, your skin needs to be as free of germs as possible. You can reduce the number of germs on your skin by washing with CHG (chlorahexidine  gluconate) Soap before surgery.  CHG is an antiseptic cleaner which kills germs and bonds with the skin to continue killing germs even after washing.    Oral Hygiene is also important to reduce your risk of infection.  Remember - BRUSH YOUR TEETH THE MORNING OF SURGERY WITH YOUR REGULAR TOOTHPASTE  Please do not use if you have an allergy to CHG or antibacterial soaps. If your skin becomes reddened/irritated stop using the CHG.  Do not shave (including legs and underarms) for at least 48 hours prior to first CHG shower. It is OK to shave your face.  Please follow these instructions carefully.   1. Shower the NIGHT BEFORE SURGERY and the MORNING OF SURGERY with CHG Soap.   2. If you chose to wash your hair and body, wash as usual with your normal shampoo and body-wash/soap.  3. Rinse your hair and body thoroughly to remove the shampoo and soap.  4. Apply CHG directly to the skin (ONLY FROM THE NECK DOWN) and wash gently with a scrungie or a clean washcloth.   5. Do not use on open wounds or open sores. Avoid contact with your eyes, ears, mouth and genitals (private parts). Wash Face and genitals (private parts)  with your normal soap.   6. Wash thoroughly, paying special attention to the area where your surgery will be performed.  7. Thoroughly rinse your body with warm water from the neck down.  8. DO NOT shower/wash with your normal soap after using and rinsing off the CHG Soap.  9. Pat yourself dry with a CLEAN TOWEL.  10. Wear CLEAN PAJAMAS to bed the night before surgery  11. Place CLEAN SHEETS on your bed the night of your first shower and DO NOT SLEEP WITH PETS.  12. Wear comfortable clothes the morning of surgery.     Day of Surgery:  Please shower the morning of surgery with the CHG soap Do not apply any deodorants/lotions. Please wear clean clothes to the hospital/surgery center.   Remember to brush your teeth WITH YOUR REGULAR TOOTHPASTE.   Please read over the  following fact sheets that you were given.

## 2019-12-14 ENCOUNTER — Encounter (HOSPITAL_COMMUNITY)
Admission: RE | Admit: 2019-12-14 | Discharge: 2019-12-14 | Disposition: A | Discharge status: Home / Self Care | Payer: BC Managed Care – PPO | Source: Ambulatory Visit | Attending: Surgery | Admitting: Surgery

## 2019-12-14 ENCOUNTER — Other Ambulatory Visit: Payer: Self-pay

## 2019-12-14 ENCOUNTER — Encounter (HOSPITAL_COMMUNITY): Payer: Self-pay

## 2019-12-14 DIAGNOSIS — Z01812 Encounter for preprocedural laboratory examination: Secondary | ICD-10-CM | POA: Insufficient documentation

## 2019-12-14 HISTORY — DX: Other complications of anesthesia, initial encounter: T88.59XA

## 2019-12-14 HISTORY — DX: Hypothyroidism, unspecified: E03.9

## 2019-12-14 LAB — COMPREHENSIVE METABOLIC PANEL
ALT: 16 U/L (ref 0–44)
AST: 17 U/L (ref 15–41)
Albumin: 4.2 g/dL (ref 3.5–5.0)
Alkaline Phosphatase: 66 U/L (ref 38–126)
Anion gap: 10 (ref 5–15)
BUN: 15 mg/dL (ref 6–20)
CO2: 26 mmol/L (ref 22–32)
Calcium: 9.4 mg/dL (ref 8.9–10.3)
Chloride: 104 mmol/L (ref 98–111)
Creatinine, Ser: 0.87 mg/dL (ref 0.44–1.00)
GFR calc Af Amer: >60 mL/min (ref >60)
GFR calc non Af Amer: >60 mL/min (ref >60)
Glucose, Bld: 102 mg/dL — ABNORMAL HIGH (ref 70–99)
Potassium: 3.9 mmol/L (ref 3.5–5.1)
Sodium: 140 mmol/L (ref 135–145)
Total Bilirubin: 0.8 mg/dL (ref 0.3–1.2)
Total Protein: 7.5 g/dL (ref 6.5–8.1)

## 2019-12-14 LAB — CBC WITH DIFFERENTIAL/PLATELET
Abs Immature Granulocytes: 0.01 10*3/uL (ref 0.00–0.07)
Basophils Absolute: 0 10*3/uL (ref 0.0–0.1)
Basophils Relative: 1 %
Eosinophils Absolute: 0.1 10*3/uL (ref 0.0–0.5)
Eosinophils Relative: 2 %
HCT: 41.7 % (ref 36.0–46.0)
Hemoglobin: 13.4 g/dL (ref 12.0–15.0)
Immature Granulocytes: 0 %
Lymphocytes Relative: 35 %
Lymphs Abs: 1.6 10*3/uL (ref 0.7–4.0)
MCH: 28.6 pg (ref 26.0–34.0)
MCHC: 32.1 g/dL (ref 30.0–36.0)
MCV: 88.9 fL (ref 80.0–100.0)
Monocytes Absolute: 0.3 10*3/uL (ref 0.1–1.0)
Monocytes Relative: 6 %
Neutro Abs: 2.6 10*3/uL (ref 1.7–7.7)
Neutrophils Relative %: 56 %
Platelets: 227 10*3/uL (ref 150–400)
RBC: 4.69 MIL/uL (ref 3.87–5.11)
RDW: 12.6 % (ref 11.5–15.5)
WBC: 4.7 10*3/uL (ref 4.0–10.5)
nRBC: 0 % (ref 0.0–0.2)

## 2019-12-14 NOTE — Progress Notes (Signed)
PCP - Dr. Billey Gosling Cardiologist - denies   PPM/ICD - denies  Chest x-ray - N/A EKG - N/A Stress Test - denies  ECHO - denies Cardiac Cath - denies  Sleep Study - denies CPAP - denies  DM: denies  Blood Thinner Instructions: N/A Aspirin Instructions: N/A  ERAS Protcol - No  COVID TEST- Scheduled for 12/20/2019. Patient verbalized understanding of self-quarantine instructions, appointment time and place.  Anesthesia review: YES, breast seed placement scheduled for 12/22/2019  Patient denies shortness of breath, fever, cough and chest pain at PAT appointment  All instructions explained to the patient, with a verbal understanding of the material. Patient agrees to go over the instructions while at home for a better understanding. Patient also instructed to self quarantine after being tested for COVID-19. The opportunity to ask questions was provided.

## 2019-12-15 NOTE — Anesthesia Preprocedure Evaluation (Addendum)
Anesthesia Evaluation  Patient identified by MRN, date of birth, ID band Patient awake    Reviewed: Allergy & Precautions, NPO status , Patient's Chart, lab work & pertinent test results  History of Anesthesia Complications Negative for: history of anesthetic complications  Airway Mallampati: I  TM Distance: >3 FB Neck ROM: Full    Dental  (+) Dental Advisory Given, Teeth Intact   Pulmonary neg pulmonary ROS,    Pulmonary exam normal        Cardiovascular negative cardio ROS Normal cardiovascular exam     Neuro/Psych  Tinnitus b/l  negative psych ROS   GI/Hepatic Neg liver ROS, GERD  Controlled and Medicated,  Endo/Other  Hypothyroidism  Pre-DM Obesity   Renal/GU negative Renal ROS     Musculoskeletal  (+) Arthritis ,   Abdominal   Peds  Hematology negative hematology ROS (+)   Anesthesia Other Findings Covid neg 5/24  Reproductive/Obstetrics  Premature ovarian failure Fibroids S/p hysterectomy Breast cancer                             Anesthesia Physical Anesthesia Plan  ASA: II  Anesthesia Plan: General   Post-op Pain Management:    Induction: Intravenous  PONV Risk Score and Plan: 3 and Treatment may vary due to age or medical condition, Ondansetron, Dexamethasone, Midazolam and Scopolamine patch - Pre-op  Airway Management Planned: LMA  Additional Equipment: None  Intra-op Plan:   Post-operative Plan: Extubation in OR  Informed Consent: I have reviewed the patients History and Physical, chart, labs and discussed the procedure including the risks, benefits and alternatives for the proposed anesthesia with the patient or authorized representative who has indicated his/her understanding and acceptance.     Dental advisory given  Plan Discussed with: CRNA and Anesthesiologist  Anesthesia Plan Comments:      Anesthesia Quick Evaluation

## 2019-12-16 ENCOUNTER — Encounter: Payer: Self-pay | Admitting: *Deleted

## 2019-12-20 ENCOUNTER — Other Ambulatory Visit (HOSPITAL_COMMUNITY)
Admission: RE | Admit: 2019-12-20 | Discharge: 2019-12-20 | Disposition: A | Discharge status: Home / Self Care | Payer: BC Managed Care – PPO | Source: Ambulatory Visit | Attending: Surgery | Admitting: Surgery

## 2019-12-20 DIAGNOSIS — Z20822 Contact with and (suspected) exposure to covid-19: Secondary | ICD-10-CM | POA: Diagnosis not present

## 2019-12-20 DIAGNOSIS — Z01812 Encounter for preprocedural laboratory examination: Secondary | ICD-10-CM | POA: Insufficient documentation

## 2019-12-20 LAB — SARS CORONAVIRUS 2 (TAT 6-24 HRS): SARS Coronavirus 2: NEGATIVE

## 2019-12-22 ENCOUNTER — Ambulatory Visit
Admission: RE | Admit: 2019-12-22 | Discharge: 2019-12-22 | Disposition: A | Discharge status: Home / Self Care | Payer: BC Managed Care – PPO | Source: Ambulatory Visit | Attending: Surgery | Admitting: Surgery

## 2019-12-22 ENCOUNTER — Other Ambulatory Visit: Payer: Self-pay | Admitting: Surgery

## 2019-12-22 ENCOUNTER — Other Ambulatory Visit: Payer: Self-pay

## 2019-12-22 DIAGNOSIS — C50911 Malignant neoplasm of unspecified site of right female breast: Secondary | ICD-10-CM

## 2019-12-23 ENCOUNTER — Ambulatory Visit
Admission: RE | Admit: 2019-12-23 | Discharge: 2019-12-23 | Disposition: A | Discharge status: Home / Self Care | Payer: BC Managed Care – PPO | Source: Ambulatory Visit | Attending: Surgery | Admitting: Surgery

## 2019-12-23 ENCOUNTER — Ambulatory Visit (HOSPITAL_COMMUNITY)
Admission: RE | Admit: 2019-12-23 | Discharge: 2019-12-23 | Disposition: A | Discharge status: Home / Self Care | Payer: BC Managed Care – PPO | Source: Ambulatory Visit | Attending: Surgery | Admitting: Surgery

## 2019-12-23 ENCOUNTER — Ambulatory Visit (HOSPITAL_COMMUNITY): Payer: BC Managed Care – PPO | Admitting: Anesthesiology

## 2019-12-23 ENCOUNTER — Other Ambulatory Visit: Payer: Self-pay

## 2019-12-23 ENCOUNTER — Ambulatory Visit (HOSPITAL_COMMUNITY)
Admission: RE | Admit: 2019-12-23 | Discharge: 2019-12-23 | Disposition: A | Discharge status: Home / Self Care | Payer: BC Managed Care – PPO | Attending: Surgery | Admitting: Surgery

## 2019-12-23 ENCOUNTER — Ambulatory Visit (HOSPITAL_COMMUNITY): Payer: BC Managed Care – PPO | Admitting: Physician Assistant

## 2019-12-23 ENCOUNTER — Encounter (HOSPITAL_COMMUNITY)
Admission: RE | Disposition: A | Discharge status: Home / Self Care | Payer: Self-pay | Source: Home / Self Care | Attending: Surgery

## 2019-12-23 ENCOUNTER — Encounter (HOSPITAL_COMMUNITY): Payer: Self-pay | Admitting: Surgery

## 2019-12-23 DIAGNOSIS — C50411 Malignant neoplasm of upper-outer quadrant of right female breast: Secondary | ICD-10-CM | POA: Diagnosis present

## 2019-12-23 DIAGNOSIS — H9313 Tinnitus, bilateral: Secondary | ICD-10-CM | POA: Diagnosis not present

## 2019-12-23 DIAGNOSIS — C50911 Malignant neoplasm of unspecified site of right female breast: Secondary | ICD-10-CM

## 2019-12-23 DIAGNOSIS — K219 Gastro-esophageal reflux disease without esophagitis: Secondary | ICD-10-CM | POA: Diagnosis not present

## 2019-12-23 DIAGNOSIS — Z6830 Body mass index (BMI) 30.0-30.9, adult: Secondary | ICD-10-CM | POA: Insufficient documentation

## 2019-12-23 DIAGNOSIS — E669 Obesity, unspecified: Secondary | ICD-10-CM | POA: Insufficient documentation

## 2019-12-23 DIAGNOSIS — E039 Hypothyroidism, unspecified: Secondary | ICD-10-CM | POA: Diagnosis not present

## 2019-12-23 DIAGNOSIS — Z170 Estrogen receptor positive status [ER+]: Secondary | ICD-10-CM | POA: Diagnosis not present

## 2019-12-23 DIAGNOSIS — Z886 Allergy status to analgesic agent status: Secondary | ICD-10-CM | POA: Insufficient documentation

## 2019-12-23 DIAGNOSIS — Z90710 Acquired absence of both cervix and uterus: Secondary | ICD-10-CM | POA: Diagnosis not present

## 2019-12-23 DIAGNOSIS — Z79899 Other long term (current) drug therapy: Secondary | ICD-10-CM | POA: Diagnosis not present

## 2019-12-23 DIAGNOSIS — Z882 Allergy status to sulfonamides status: Secondary | ICD-10-CM | POA: Diagnosis not present

## 2019-12-23 DIAGNOSIS — M1990 Unspecified osteoarthritis, unspecified site: Secondary | ICD-10-CM | POA: Insufficient documentation

## 2019-12-23 DIAGNOSIS — D0501 Lobular carcinoma in situ of right breast: Secondary | ICD-10-CM | POA: Diagnosis not present

## 2019-12-23 DIAGNOSIS — R7303 Prediabetes: Secondary | ICD-10-CM | POA: Diagnosis not present

## 2019-12-23 DIAGNOSIS — Z881 Allergy status to other antibiotic agents status: Secondary | ICD-10-CM | POA: Insufficient documentation

## 2019-12-23 DIAGNOSIS — C50312 Malignant neoplasm of lower-inner quadrant of left female breast: Secondary | ICD-10-CM

## 2019-12-23 HISTORY — PX: BREAST LUMPECTOMY WITH RADIOACTIVE SEED AND SENTINEL LYMPH NODE BIOPSY: SHX6550

## 2019-12-23 SURGERY — BREAST LUMPECTOMY WITH RADIOACTIVE SEED AND SENTINEL LYMPH NODE BIOPSY
Anesthesia: General | Site: Breast | Laterality: Right

## 2019-12-23 MED ORDER — PROPOFOL 10 MG/ML IV BOLUS
INTRAVENOUS | Status: DC | PRN
  Administered 2019-12-23: 180 mg via INTRAVENOUS

## 2019-12-23 MED ORDER — CHLORHEXIDINE GLUCONATE CLOTH 2 % EX PADS: 6.0000 | MEDICATED_PAD | Freq: Once | CUTANEOUS | Status: DC

## 2019-12-23 MED ORDER — LIDOCAINE 2% (20 MG/ML) 5 ML SYRINGE
INTRAMUSCULAR | Status: DC | PRN
  Administered 2019-12-23: 60 mg via INTRAVENOUS

## 2019-12-23 MED ORDER — FENTANYL CITRATE (PF) 250 MCG/5ML IJ SOLN
INTRAMUSCULAR | Status: AC
  Filled 2019-12-23: qty 5

## 2019-12-23 MED ORDER — OXYCODONE HCL 5 MG/5ML PO SOLN: 5.0000 mg | Freq: Once | ORAL | Status: AC | PRN

## 2019-12-23 MED ORDER — MIDAZOLAM HCL 5 MG/5ML IJ SOLN
INTRAMUSCULAR | Status: DC | PRN
  Administered 2019-12-23 (×2): 1 mg via INTRAVENOUS

## 2019-12-23 MED ORDER — ONDANSETRON HCL 4 MG/2ML IJ SOLN
INTRAMUSCULAR | Status: DC | PRN
  Administered 2019-12-23: 4 mg via INTRAVENOUS

## 2019-12-23 MED ORDER — OXYCODONE HCL 5 MG PO TABS
ORAL_TABLET | ORAL | Status: AC
  Filled 2019-12-23: qty 1

## 2019-12-23 MED ORDER — BUPIVACAINE-EPINEPHRINE 0.25% -1:200000 IJ SOLN
INTRAMUSCULAR | Status: DC | PRN
  Administered 2019-12-23: 8 mL

## 2019-12-23 MED ORDER — PROMETHAZINE HCL 25 MG/ML IJ SOLN: 6.2500 mg | INTRAMUSCULAR | Status: DC | PRN

## 2019-12-23 MED ORDER — CEFAZOLIN SODIUM-DEXTROSE 2-4 GM/100ML-% IV SOLN
2.0000 g | INTRAVENOUS | Status: AC
  Administered 2019-12-23: 2 g via INTRAVENOUS
  Filled 2019-12-23: qty 100

## 2019-12-23 MED ORDER — OXYCODONE HCL 5 MG PO TABS
5.0000 mg | ORAL_TABLET | Freq: Four times a day (QID) | ORAL | 0 refills | Status: DC | PRN
Start: 2019-12-23 — End: 2020-01-10

## 2019-12-23 MED ORDER — MIDAZOLAM HCL 2 MG/2ML IJ SOLN
INTRAMUSCULAR | Status: AC
  Filled 2019-12-23: qty 2

## 2019-12-23 MED ORDER — LIDOCAINE 2% (20 MG/ML) 5 ML SYRINGE
INTRAMUSCULAR | Status: AC
  Filled 2019-12-23: qty 5

## 2019-12-23 MED ORDER — DEXAMETHASONE SODIUM PHOSPHATE 10 MG/ML IJ SOLN
INTRAMUSCULAR | Status: DC | PRN
  Administered 2019-12-23: 4 mg via INTRAVENOUS

## 2019-12-23 MED ORDER — TECHNETIUM TC 99M SULFUR COLLOID FILTERED
1.0000 | Freq: Once | INTRAVENOUS | Status: AC | PRN
  Administered 2019-12-23: 1 via INTRADERMAL

## 2019-12-23 MED ORDER — OXYCODONE HCL 5 MG PO TABS
5.0000 mg | ORAL_TABLET | Freq: Once | ORAL | Status: AC | PRN
  Administered 2019-12-23: 5 mg via ORAL

## 2019-12-23 MED ORDER — ONDANSETRON HCL 4 MG/2ML IJ SOLN
INTRAMUSCULAR | Status: AC
  Filled 2019-12-23: qty 2

## 2019-12-23 MED ORDER — METHYLENE BLUE 0.5 % INJ SOLN
INTRAVENOUS | Status: AC
  Filled 2019-12-23: qty 10

## 2019-12-23 MED ORDER — FENTANYL CITRATE (PF) 100 MCG/2ML IJ SOLN
INTRAMUSCULAR | Status: AC
  Filled 2019-12-23: qty 2

## 2019-12-23 MED ORDER — FENTANYL CITRATE (PF) 100 MCG/2ML IJ SOLN
25.0000 ug | INTRAMUSCULAR | Status: DC | PRN
  Administered 2019-12-23 (×3): 50 ug via INTRAVENOUS

## 2019-12-23 MED ORDER — KETOROLAC TROMETHAMINE 30 MG/ML IJ SOLN
30.0000 mg | Freq: Once | INTRAMUSCULAR | Status: AC
  Administered 2019-12-23: 30 mg via INTRAVENOUS

## 2019-12-23 MED ORDER — ACETAMINOPHEN 500 MG PO TABS
1000.0000 mg | ORAL_TABLET | ORAL | Status: AC
  Administered 2019-12-23: 1000 mg via ORAL
  Filled 2019-12-23: qty 2

## 2019-12-23 MED ORDER — KETOROLAC TROMETHAMINE 30 MG/ML IJ SOLN
INTRAMUSCULAR | Status: AC
  Filled 2019-12-23: qty 1

## 2019-12-23 MED ORDER — DEXAMETHASONE SODIUM PHOSPHATE 10 MG/ML IJ SOLN
INTRAMUSCULAR | Status: AC
  Filled 2019-12-23: qty 1

## 2019-12-23 MED ORDER — BUPIVACAINE HCL (PF) 0.25 % IJ SOLN
INTRAMUSCULAR | Status: AC
  Filled 2019-12-23: qty 30

## 2019-12-23 MED ORDER — CHLORHEXIDINE GLUCONATE 0.12 % MT SOLN
15.0000 mL | Freq: Once | OROMUCOSAL | Status: AC
  Administered 2019-12-23: 15 mL via OROMUCOSAL
  Filled 2019-12-23: qty 15

## 2019-12-23 MED ORDER — ROCURONIUM BROMIDE 10 MG/ML (PF) SYRINGE
PREFILLED_SYRINGE | INTRAVENOUS | Status: AC
  Filled 2019-12-23: qty 10

## 2019-12-23 MED ORDER — 0.9 % SODIUM CHLORIDE (POUR BTL) OPTIME
TOPICAL | Status: DC | PRN
  Administered 2019-12-23: 1000 mL

## 2019-12-23 MED ORDER — PHENYLEPHRINE 40 MCG/ML (10ML) SYRINGE FOR IV PUSH (FOR BLOOD PRESSURE SUPPORT)
PREFILLED_SYRINGE | INTRAVENOUS | Status: DC | PRN
  Administered 2019-12-23: 120 ug via INTRAVENOUS
  Administered 2019-12-23: 40 ug via INTRAVENOUS

## 2019-12-23 MED ORDER — PROPOFOL 10 MG/ML IV BOLUS
INTRAVENOUS | Status: AC
  Filled 2019-12-23: qty 20

## 2019-12-23 MED ORDER — EPHEDRINE SULFATE-NACL 50-0.9 MG/10ML-% IV SOSY
PREFILLED_SYRINGE | INTRAVENOUS | Status: DC | PRN
  Administered 2019-12-23 (×2): 10 mg via INTRAVENOUS
  Administered 2019-12-23 (×3): 5 mg via INTRAVENOUS

## 2019-12-23 MED ORDER — ORAL CARE MOUTH RINSE: 15.0000 mL | Freq: Once | OROMUCOSAL | Status: AC

## 2019-12-23 MED ORDER — GABAPENTIN 300 MG PO CAPS
300.0000 mg | ORAL_CAPSULE | ORAL | Status: AC
  Administered 2019-12-23: 300 mg via ORAL
  Filled 2019-12-23: qty 1

## 2019-12-23 MED ORDER — FENTANYL CITRATE (PF) 100 MCG/2ML IJ SOLN
INTRAMUSCULAR | Status: DC | PRN
  Administered 2019-12-23: 50 ug via INTRAVENOUS
  Administered 2019-12-23 (×2): 25 ug via INTRAVENOUS

## 2019-12-23 MED ORDER — LACTATED RINGERS IV SOLN: INTRAVENOUS | Status: DC | PRN

## 2019-12-23 SURGICAL SUPPLY — 45 items
ADH SKN CLS APL DERMABOND .7 (GAUZE/BANDAGES/DRESSINGS) ×1
APL PRP STRL LF DISP 70% ISPRP (MISCELLANEOUS) ×1
APPLIER CLIP 9.375 MED OPEN (MISCELLANEOUS) ×3
APR CLP MED 9.3 20 MLT OPN (MISCELLANEOUS) ×1
BINDER BREAST LRG (GAUZE/BANDAGES/DRESSINGS) IMPLANT
BINDER BREAST XLRG (GAUZE/BANDAGES/DRESSINGS) ×2 IMPLANT
CANISTER SUCT 3000ML PPV (MISCELLANEOUS) ×3 IMPLANT
CHLORAPREP W/TINT 26 (MISCELLANEOUS) ×3 IMPLANT
CLIP APPLIE 9.375 MED OPEN (MISCELLANEOUS) ×1 IMPLANT
CNTNR URN SCR LID CUP LEK RST (MISCELLANEOUS) ×1 IMPLANT
CONT SPEC 4OZ STRL OR WHT (MISCELLANEOUS) ×3
COVER PROBE W GEL 5X96 (DRAPES) ×3 IMPLANT
COVER SURGICAL LIGHT HANDLE (MISCELLANEOUS) ×3 IMPLANT
COVER WAND RF STERILE (DRAPES) IMPLANT
DERMABOND ADVANCED (GAUZE/BANDAGES/DRESSINGS) ×2
DERMABOND ADVANCED .7 DNX12 (GAUZE/BANDAGES/DRESSINGS) ×1 IMPLANT
DEVICE DUBIN SPECIMEN MAMMOGRA (MISCELLANEOUS) ×3 IMPLANT
DRAPE CHEST BREAST 15X10 FENES (DRAPES) ×3 IMPLANT
ELECT CAUTERY BLADE 6.4 (BLADE) ×3 IMPLANT
ELECT REM PT RETURN 9FT ADLT (ELECTROSURGICAL) ×3
ELECTRODE REM PT RTRN 9FT ADLT (ELECTROSURGICAL) ×1 IMPLANT
GLOVE BIO SURGEON STRL SZ8 (GLOVE) ×3 IMPLANT
GLOVE BIOGEL PI IND STRL 8 (GLOVE) ×1 IMPLANT
GLOVE BIOGEL PI INDICATOR 8 (GLOVE) ×2
GOWN STRL REUS W/ TWL LRG LVL3 (GOWN DISPOSABLE) ×1 IMPLANT
GOWN STRL REUS W/ TWL XL LVL3 (GOWN DISPOSABLE) ×1 IMPLANT
GOWN STRL REUS W/TWL LRG LVL3 (GOWN DISPOSABLE) ×3
GOWN STRL REUS W/TWL XL LVL3 (GOWN DISPOSABLE) ×3
KIT BASIN OR (CUSTOM PROCEDURE TRAY) ×3 IMPLANT
KIT MARKER MARGIN INK (KITS) ×3 IMPLANT
LIGHT WAVEGUIDE WIDE FLAT (MISCELLANEOUS) ×2 IMPLANT
NDL 18GX1X1/2 (RX/OR ONLY) (NEEDLE) IMPLANT
NDL FILTER BLUNT 18X1 1/2 (NEEDLE) IMPLANT
NEEDLE 18GX1X1/2 (RX/OR ONLY) (NEEDLE) IMPLANT
NEEDLE FILTER BLUNT 18X 1/2SAF (NEEDLE)
NEEDLE FILTER BLUNT 18X1 1/2 (NEEDLE) IMPLANT
NEEDLE HYPO 25GX1X1/2 BEV (NEEDLE) ×3 IMPLANT
NS IRRIG 1000ML POUR BTL (IV SOLUTION) ×3 IMPLANT
PACK GENERAL/GYN (CUSTOM PROCEDURE TRAY) ×3 IMPLANT
PAD ABD 8X10 STRL (GAUZE/BANDAGES/DRESSINGS) ×2 IMPLANT
SUT MNCRL AB 4-0 PS2 18 (SUTURE) ×3 IMPLANT
SUT VIC AB 3-0 SH 18 (SUTURE) ×3 IMPLANT
SYR CONTROL 10ML LL (SYRINGE) ×3 IMPLANT
TOWEL GREEN STERILE (TOWEL DISPOSABLE) ×3 IMPLANT
TOWEL GREEN STERILE FF (TOWEL DISPOSABLE) ×3 IMPLANT

## 2019-12-23 NOTE — Discharge Instructions (Signed)
[facility] °Office Phone Number [phone] ° °BREAST BIOPSY/ PARTIAL MASTECTOMY: POST OP INSTRUCTIONS ° °Always review your discharge instruction sheet given to you by the facility where your surgery was performed. ° °IF YOU HAVE DISABILITY OR FAMILY LEAVE FORMS, YOU MUST BRING THEM TO THE OFFICE FOR PROCESSING.  DO NOT GIVE THEM TO YOUR DOCTOR. ° °1. A prescription for pain medication may be given to you upon discharge.  Take your pain medication as prescribed, if needed.  If narcotic pain medicine is not needed, then you may take acetaminophen (Tylenol) or ibuprofen (Advil) as needed. °2. Take your usually prescribed medications unless otherwise directed °3. If you need a refill on your pain medication, please contact your pharmacy.  They will contact our office to request authorization.  Prescriptions will not be filled after 5pm or on week-ends. °4. You should eat very light the first 24 hours after surgery, such as soup, crackers, pudding, etc.  Resume your normal diet the day after surgery. °5. Most patients will experience some swelling and bruising in the breast.  Ice packs and a good support bra will help.  Swelling and bruising can take several days to resolve.  °6. It is common to experience some constipation if taking pain medication after surgery.  Increasing fluid intake and taking a stool softener will usually help or prevent this problem from occurring.  A mild laxative (Milk of Magnesia or Miralax) should be taken according to package directions if there are no bowel movements after 48 hours. °7. Unless discharge instructions indicate otherwise, you may remove your bandages 24-48 hours after surgery, and you may shower at that time.  You may have steri-strips (small skin tapes) in place directly over the incision.  These strips should be left on the skin for 7-10 days.  If your surgeon used skin glue on the incision, you may shower in 24 hours.  The glue will flake off over the  next 2-3 weeks.  Any sutures or staples will be removed at the office during your follow-up visit. °8. ACTIVITIES:  You may resume regular daily activities (gradually increasing) beginning the next day.  Wearing a good support bra or sports bra minimizes pain and swelling.  You may have sexual intercourse when it is comfortable. °a. You may drive when you no longer are taking prescription pain medication, you can comfortably wear a seatbelt, and you can safely maneuver your car and apply brakes. °b. RETURN TO WORK:  ______________________________________________________________________________________ °9. You should see your doctor in the office for a follow-up appointment approximately two weeks after your surgery.  Your doctor’s nurse will typically make your follow-up appointment when she calls you with your pathology report.  Expect your pathology report 2-3 business days after your surgery.  You may call to check if you do not hear from us after three days. °10. OTHER INSTRUCTIONS: _______________________________________________________________________________________________ _____________________________________________________________________________________________________________________________________ °_____________________________________________________________________________________________________________________________________ °_____________________________________________________________________________________________________________________________________ ° °WHEN TO CALL YOUR DOCTOR: °1. Fever over 101.0 °2. Nausea and/or vomiting. °3. Extreme swelling or bruising. °4. Continued bleeding from incision. °5. Increased pain, redness, or drainage from the incision. ° °The clinic staff is available to answer your questions during regular business hours.  Please don’t hesitate to call and ask to speak to one of the nurses for clinical concerns.  If you have a medical emergency, go to the nearest  emergency room or call 911.  A surgeon from [facility] is always on call at the hospital. ° °For further questions, please visit [email or web address]  °

## 2019-12-23 NOTE — Anesthesia Procedure Notes (Signed)
Procedure Name: LMA Insertion °Performed by: Cierrah Dace H, CRNA °Pre-anesthesia Checklist: Patient identified, Emergency Drugs available, Suction available and Patient being monitored °Patient Re-evaluated:Patient Re-evaluated prior to induction °Oxygen Delivery Method: Circle System Utilized °Preoxygenation: Pre-oxygenation with 100% oxygen °Induction Type: IV induction °Ventilation: Mask ventilation without difficulty °LMA: LMA inserted °LMA Size: 4.0 °Number of attempts: 1 °Airway Equipment and Method: Bite block °Placement Confirmation: positive ETCO2 °Tube secured with: Tape °Dental Injury: Teeth and Oropharynx as per pre-operative assessment  ° ° ° ° ° ° °

## 2019-12-23 NOTE — Interval H&P Note (Signed)
History and Physical Interval Note:  12/23/2019 7:03 AM  Alexis Young  has presented today for surgery, with the diagnosis of RIGHT BREAST CANCER.  The various methods of treatment have been discussed with the patient and family. After consideration of risks, benefits and other options for treatment, the patient has consented to  Procedure(s): RIGHT BREAST LUMPECTOMY X 3 WITH RADIOACTIVE SEED AND SENTINEL LYMPH NODE MAPPING (Right) as a surgical intervention.  The patient's history has been reviewed, patient examined, no change in status, stable for surgery.  I have reviewed the patient's chart and labs.  Questions were answered to the patient's satisfaction.     [place]

## 2019-12-23 NOTE — Op Note (Signed)
Preoperative diagnosis: Stage I right breast cancer upper outer quadrant  Postoperative diagnosis: Same  Procedure: Right breast seed localized lumpectomy x3 with right axillary sentinel lymph node mapping  Surgeon: Erroll Luna, MD  Anesthesia: LMA with pectoral block and 0.25% Marcaine plain  EBL: 20 cc  Specimen: Right breast tissue.  The medial component contains 2 seeds and 2 clips.  The lateral component contains the clip only.  Third seed was sent separate from the specimen.  Plus lumpectomy cavity margins x6 were excised into right axillary sentinel nodes  Drains: None  IV fluids: Per anesthesia record  Indications for procedure: The patient is a 54 year old female diagnosed with stage I right breast cancer.  She extremely dense breast and 2 other areas of biopsy in the same quadrant that were consistent with lobular neoplasia.  Excision was recommended of all 3 areas at the time of lumpectomy.  We discussed mastectomy with reconstruction versus breast conserving surgery.  I discussed potential cosmetic issues given the area that was necessary to excise.  She still desired an attempt at breast conserving surgery therefore we proceeded today with lumpectomy and right axillary sentinel mapping.The procedure has been discussed with the patient. Alternatives to surgery have been discussed with the patient.  Risks of surgery include bleeding,  Infection,  Seroma formation, cosmetic issues, death,  and the need for further surgery.   The patient understands and wishes to proceed.Sentinel lymph node mapping and dissection has been discussed with the patient.  Risk of bleeding,  Infection,  Seroma formation,  Additional procedures,,  Shoulder weakness ,  Shoulder stiffness,  Nerve and blood vessel injury and reaction to the mapping dyes have been discussed.  Alternatives to surgery have been discussed with the patient.  The patient agrees to proceed.   Description of procedure: The patient was  met in the holding area and questions answered.  Neoprobe used to localize all 3 seeds right breast.  She underwent pectoral block per anesthesia and injection of technetium sulfur colloid per radiology.  She was then taken back to the room.  She is placed supine upon the OR table.  After induction of general esthesia, right breast was prepped and draped in sterile fashion timeout performed.  Proper patient, site and procedure were verified.  She received appropriate preoperative antibiotics.  Films were available for review.  All 3 seeds were in the right breast upper outer quadrant originating at the lateral border of the nipple were complex extended toward the axilla for about 5 cm.  I elected to use a lateral transverse incision to encompass all 3 areas.  This was made at the edge of the nipple were complex and extended toward the axilla.  Dissection was carried down skin was mobilized off the tissue.  Neoprobe was used to identify 3 seeds.  The 2 seeds that were medial were excised separately.  Faxitron revealed both clips and seed to be in the specimen.  The third lesion was more lateral.  The seed had been dislodged from that and I remove that separately and put in a separate specimen cup.  The mass was excised in its entirety.  The Faxitron revealed the clip to be present.  There is significant dense tissue around the entire cavity.  I opted to excise all the margins to make the best attempt at securing negative margins given the gross appearance of tissue.  All margins were excised as one lumpectomy cavity and sent separately after orientation of all specimens with ink.  Irrigation was used and hemostasis achieved.  Cavity is closed with a deep layer 3-0 Vicryl and 4 Monocryl.  Of note the cavity was clipped prior to closure.  Neoprobe settings changed technetium.  Hotspot identified in the right axilla and a 4 cm incision was made in the right axilla.  Dissection was carried down into the level 1 deep  axillary contents.  There were 2 hot nodes identified and excised with the help the neoprobe.  Background counts approached 0.  Hemostasis was achieved with cautery.  Deep layer closed with 3-0 Vicryl.  4 Monocryl used to close the skin.  Dermabond applied.  Breast binder placed.  All counts were found to be correct.  The patient was awoke extubated taken recovery in satisfactory condition.

## 2019-12-23 NOTE — Transfer of Care (Signed)
Immediate Anesthesia Transfer of Care Note  Patient: Alexis Young  Procedure(s) Performed: RIGHT BREAST LUMPECTOMY X 3 WITH RADIOACTIVE SEED AND SENTINEL LYMPH NODE MAPPING (Right Breast)  Patient Location: PACU  Anesthesia Type:GA combined with regional for post-op pain  Level of Consciousness: drowsy  Airway & Oxygen Therapy: Patient Spontanous Breathing and Patient connected to nasal cannula oxygen  Post-op Assessment: Report given to RN and Post -op Vital signs reviewed and stable  Post vital signs: Reviewed and stable  Last Vitals:  Vitals Value Taken Time  BP 137/66 12/23/19 0912  Temp    Pulse 78 12/23/19 0913  Resp 18 12/23/19 0913  SpO2 100 % 12/23/19 0913  Vitals shown include unvalidated device data.  Last Pain:  Vitals:   12/23/19 0642  TempSrc: Tympanic  PainSc:       Patients Stated Pain Goal: 3 ([id number])  Complications: No apparent anesthesia complications

## 2019-12-23 NOTE — Anesthesia Postprocedure Evaluation (Signed)
Anesthesia Post Note  Patient: Jeslyn N Duckett  Procedure(s) Performed: RIGHT BREAST LUMPECTOMY X 3 WITH RADIOACTIVE SEED AND SENTINEL LYMPH NODE MAPPING (Right Breast)     Patient location during evaluation: PACU Anesthesia Type: General Level of consciousness: awake and alert Pain management: pain level controlled Vital Signs Assessment: post-procedure vital signs reviewed and stable Respiratory status: spontaneous breathing, nonlabored ventilation and respiratory function stable Cardiovascular status: blood pressure returned to baseline and stable Postop Assessment: no apparent nausea or vomiting Anesthetic complications: no    Last Vitals:  Vitals:   12/23/19 1010 12/23/19 1012  BP: (!) 146/99 (!) 162/90  Pulse: 76 74  Resp: 18 19  Temp:  36.5 C  SpO2: 100% 100%    Last Pain:  Vitals:   12/23/19 1012  TempSrc:   PainSc: [place]

## 2019-12-23 NOTE — Anesthesia Procedure Notes (Signed)
Anesthesia Regional Block: Pectoralis block   Pre-Anesthetic Checklist: ,, timeout performed, Correct Patient, Correct Site, Correct Laterality, Correct Procedure, Correct Position, site marked, Risks and benefits discussed,  Surgical consent,  Pre-op evaluation,  At surgeon's request and post-op pain management  Laterality: Right  Prep: chloraprep       Needles:  Injection technique: Single-shot  Needle Type: Echogenic Needle     Needle Length: 10cm  Needle Gauge: 21     Additional Needles:   Narrative:  Start time: 12/23/2019 7:08 AM End time: 12/23/2019 7:11 AM Injection made incrementally with aspirations every 5 mL.  Performed by: Personally  Anesthesiologist: Audry Pili, MD  Additional Notes: No pain on injection. No increased resistance to injection. Injection made in 5cc increments. Good needle visualization. Patient tolerated the procedure well.

## 2019-12-24 ENCOUNTER — Encounter: Payer: Self-pay | Admitting: *Deleted

## 2019-12-30 ENCOUNTER — Encounter: Payer: Self-pay | Admitting: *Deleted

## 2019-12-30 LAB — SURGICAL PATHOLOGY

## 2019-12-31 ENCOUNTER — Other Ambulatory Visit: Payer: Self-pay | Admitting: Internal Medicine

## 2019-12-31 ENCOUNTER — Other Ambulatory Visit: Payer: Self-pay | Admitting: Oncology

## 2020-01-09 NOTE — Progress Notes (Signed)
[facility] [place]  [phone] [phone]     ID: Margan Elias Jost DOB: 12-Feb-1966  MR#: [id number]  ERD#:[id number]  Patient Care Team: Binnie Rail, MD as PCP - General (Internal Medicine) Mauro Kaufmann, RN as Oncology Nurse Navigator Rockwell Germany, RN as Oncology Nurse Navigator Rahkim Rabalais, Virgie Dad, MD as Consulting Physician (Oncology) Gery Pray, MD as Consulting Physician (Radiation Oncology) Erroll Luna, MD as Consulting Physician (General Surgery) Yisroel Ramming, Everardo All, MD as Consulting Physician (Obstetrics and Gynecology) Garrel Ridgel, [place] as Consulting Physician (Podiatry) Harriett Sine, MD as Consulting Physician (Dermatology) Juanita Craver, MD as Consulting Physician (Gastroenterology) Chauncey Cruel, MD OTHER MD:  CHIEF COMPLAINT: Estrogen receptor positive breast cancer  CURRENT TREATMENT: Adjuvant radiation pending   INTERVAL HISTORY: Arieanna returns today for follow up of her 54 estrogen receptor positive breast cancer accompanied by her husband. She was evaluated in the multidisciplinary breast cancer clinic on 11/10/2019.  Since consultation 54, her genetic testing results returned negative with variants of uncertain significance within APC, POLE, and RNF43.  She underwent breast MRI on 11/19/2019 showing: breast composition C; biopsy-proven upper-outer right breast malignancy; two low suspicious but indeterminate 5 mm enhancing foci in anterior right breast; indeterminate 8 mm enhancing mass in central left breast; no suspicious lymphadenopathy.  She proceeded to biopsy on 12/02/2019 of the bilateral breast areas in question. Pathology from the procedure ([id number]) showed:  1. Breast, left, needle core biopsy, superior central - FIBROADENOMA. - FIBROCYSTIC CHANGE. - NO MALIGNANCY IDENTIFIED. 2. Breast, right, needle core biopsy, upper outer quadrant - LOBULAR NEOPLASIA (ATYPICAL LOBULAR HYPERPLASIA). - COMPLEX  SCLEROSING LESION WITH FIBROCYSTIC CHANGE AND USUAL DUCTAL HYPERPLASIA. 3. Breast, right, needle core biopsy, retroareolar - LOBULAR NEOPLASIA (ATYPICAL LOBULAR HYPERPLASIA). - FIBROCYSTIC CHANGE. - PSEUDOANGIOMATOUS STROMAL HYPERPLASIA ([place]).  She opted to proceed with right lumpectomy x2 on 12/23/2019 under Dr. Brantley Stage. Pathology from the procedure [id number]) showed: A. BREAST, RIGHT MEDIAL, LUMPECTOMY:  - Foci of atypical lobular hyperplasia  - Background breast parenchyma with fibrocystic changes, including sclerosing adenosis, usual ductal hyperplasia, complex sclerosing lesions and calcifications B. BREAST, RIGHT LATERAL, LUMPECTOMY:  - Invasive lobular carcinoma, grade 2-- A discrete, gross lesion was notidentified. The largest focus of tumor measures 0.4 cm on HE glass slide - Lobular carcinoma in situ  - Resection margins are negative for carcinoma - Background breast parenchyma with fibrocystic changes, including adenosis and calcifications  C. LYMPH NODES, RIGHT AXILLARY - lymph nodes, negative for carcinoma (0/2)   REVIEW OF SYSTEMS: Brystol did well with her surgery, with mild pain, no bleeding or fever.  She is taking Tylenol occasionally for discomfort which is chiefly under the right breast, in the axilla, and a little bit in the medial right breast.  She also is having some "shooting pains".  Going off her estrogen replacement she has been having significant hot flashes both daytime and nighttime.  They do wake her up.  She also sweats during the day with these.  She is not currently exercising regularly.  A detailed review of systems was otherwise stable.   HISTORY OF CURRENT ILLNESS: From the original intake note:  Rayna N Moorehead had routine screening mammography on 10/14/2019 showing a possible abnormality in the bilateral breasts. She underwent bilateral diagnostic mammography with tomography and bilateral breast ultrasonography at [facility] on 10/27/2019  showing: breast density category C; small area of asymmetry with pronounced focal distortion in outer right breast with no ultrasound correlate; benign left breast  cysts; no abnormal-appearing lymph nodes.  Accordingly on 11/01/2019 she proceeded to biopsy of the right breast area in question. The pathology from this procedure ([id number]) showed: invasive lobular carcinoma, grade 1, e-cadherin negative. Prognostic indicators significant for: estrogen receptor, 80% positive and progesterone receptor, 80% positive, both with strong staining intensity. Proliferation marker Ki67 at 2%. HER2 negative by immunohistochemistry (0).  The patient's subsequent history is as detailed below.   PAST MEDICAL HISTORY: Past Medical History:  Diagnosis Date  . Arthritis   . Breast cancer (HCC) 11/02/2019   right breast  . Cancer (HCC) 03/2013, 06/2013   malignant melanoma on right arm and back  . Complication of anesthesia    Per patient after hysterectomy in 2016, had to stay over an extra night due to low oxygen levels  . Dyspareunia   . Elevated serum creatinine   . Family history of colon cancer   . Family history of lung cancer   . Family history of melanoma   . Family history of pancreatic cancer   . Family history of stomach cancer   . Family history of uterine cancer   . Fibroid   . GERD (gastroesophageal reflux disease)    takes protonix as needed   . Hypothyroidism   . 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 Tennis elbow 2017   Right  . Tinnitus 2020   both ears    PAST SURGICAL HISTORY: Past Surgical History:  Procedure Laterality Date  . ADENOIDECTOMY     per patient as a child  . BILATERAL SALPINGECTOMY Bilateral 02/14/2015   Procedure: BILATERAL SALPINGECTOMY OOPHORECTOMY;  Surgeon: Patton Salles, MD;  Location: [facility];  Service: Gynecology;  Laterality: Bilateral;  . BREAST BIOPSY  1987   Dr. Earlene Plater  . BREAST EXCISIONAL BIOPSY Left    benign    . BREAST LUMPECTOMY WITH RADIOACTIVE SEED AND SENTINEL LYMPH NODE BIOPSY Right 12/23/2019   Procedure: RIGHT BREAST LUMPECTOMY X 3 WITH RADIOACTIVE SEED AND SENTINEL LYMPH NODE MAPPING;  Surgeon: Harriette Bouillon, MD;  Location: [facility];  Service: General;  Laterality: Right;  . BREAST SURGERY Right 04/2010   breast biopsy for multiple fibroadenoma - no atypia  . CATARACT EXTRACTION, BILATERAL    . CESAREAN SECTION    . CRYOTHERAPY  5/91   for persistent condyloma atypia  . CYSTOSCOPY N/A 02/14/2015   Procedure: CYSTOSCOPY;  Surgeon: Patton Salles, MD;  Location: [facility];  Service: Gynecology;  Laterality: N/A;  . LASER ABLATION OF CONDYLOMAS  03/05/90 Dr. Randell Patient   perineal area- also had Bowens disease  . MELANOMA EXCISION Right 03/2013   melanoma right arm  . ROBOTIC ASSISTED TOTAL HYSTERECTOMY N/A 02/14/2015   Procedure: ROBOTIC ASSISTED TOTAL HYSTERECTOMY;  Surgeon: Patton Salles, MD;  Location: [facility];  Service: Gynecology;  Laterality: N/A;  . SKIN CANCER EXCISION  06/2013   midddle of back with path report of atypia  . TUBAL LIGATION Bilateral   . WISDOM TOOTH EXTRACTION      FAMILY HISTORY: Family History  Problem Relation Age of Onset  . Cancer Mother        lung and liver  . Alcohol abuse Father        Cirrhosis of liver  . Cancer Maternal Aunt        stomach cancer  . Cancer Maternal Grandmother        uterine cancer  . Heart failure  Paternal Grandmother   . Lung cancer Maternal Uncle   . Lung cancer Maternal Uncle   . Pancreatic cancer Paternal Uncle   . Testicular cancer Cousin   . Melanoma Cousin        of eye  . Cancer Cousin        possible colon? dx 21s  . Colon cancer Other   . Breast cancer Neg Hx   Her father died at age 11 from alcoholism. Her mother was diagnosed with lung cancer at age 57 and died at age 44. Cristalle is an only child. She reports uterine cancer in her maternal grandmother at age 44, stomach cancer in a maternal aunt at age  68, lung cancer in two maternal uncles, and colon cancer in a maternal great-aunt.   GYNECOLOGIC HISTORY:  Patient's last menstrual period was 10/15/2014 (exact date). Menarche: 54 years old Age at first live birth: 54 years old [place] P 1 LMP: 2009 Contraceptive: used for 14 years without issue HRT used for 12 years  Hysterectomy? Yes, 01/2015, benign pathology BSO? 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 Husband Richardson Landry is a Musician.  Son Cristie Hem, age 29, is a Film/video editor.     ADVANCED DIRECTIVES: In the absence of any documentation to the contrary, the patient's spouse is their HCPOA.    HEALTH MAINTENANCE: Social History   Tobacco Use  . Smoking status: Never Smoker  . Smokeless tobacco: Never Used  Vaping Use  . Vaping Use: Never used  Substance Use Topics  . Alcohol use: No  . Drug use: No     Colonoscopy: 02/2016, repeat 2027  PAP: 03/2019, negative  Bone density: 09/2019, +0.3   Allergies  Allergen Reactions  . Tetracyclines & Related Nausea Only  . Asa [Aspirin] Nausea And Vomiting and Rash  . Sulfa Antibiotics Rash    Current Outpatient Medications  Medication Sig Dispense Refill  . levothyroxine (SYNTHROID) 50 MCG tablet TAKE 1 TABLET BY MOUTH EVERY DAY 90 tablet 1  . oxyCODONE (OXY IR/ROXICODONE) 5 MG immediate release tablet Take 1 tablet (5 mg total) by mouth every 6 (six) hours as needed for severe pain. 15 tablet 0  . Polyethyl Glycol-Propyl Glycol (SYSTANE OP) Place 1 drop into both eyes daily as needed (dry eyes).     No current facility-administered medications for this visit.    OBJECTIVE: White woman who appears stated age  40:   01/10/20 0841  BP: (!) 146/79  Pulse: 64  Resp: 17  Temp: 98.3 F (36.8 C)  SpO2: 100%     Body mass index is 30.6 kg/m.   Wt Readings from Last 3 Encounters:  01/10/20 189 lb 9.6 oz (86 kg)  12/14/19 187 lb 3.2  oz (84.9 kg)  11/10/19 188 lb 9.6 oz (85.5 kg)      ECOG FS:1 - Symptomatic but completely ambulatory  Sclerae unicteric, EOMs intact Wearing a mask No cervical or supraclavicular adenopathy Lungs no rales or rhonchi Heart regular rate and rhythm Abd soft, nontender, positive bowel sounds MSK no focal spinal tenderness, no upper extremity lymphedema Neuro: nonfocal, well oriented, appropriate affect Breasts: The right breast is status post lumpectomy.  The incisions are healing nicely.  There is no evidence of dehiscence, erythema, or swelling.  The left breast is benign.  Both axillae are benign   LAB RESULTS:  CMP     Component Value Date/Time  NA 140 12/14/2019 1111   NA 142 01/13/2017 1024   K 3.9 12/14/2019 1111   CL 104 12/14/2019 1111   CO2 26 12/14/2019 1111   GLUCOSE 102 (H) 12/14/2019 1111   BUN 15 12/14/2019 1111   BUN 14 01/13/2017 1024   CREATININE 0.87 12/14/2019 1111   CREATININE 0.92 11/10/2019 0812   CREATININE 0.94 01/10/2016 1001   CALCIUM 9.4 12/14/2019 1111   PROT 7.5 12/14/2019 1111   PROT 6.8 01/13/2017 1024   ALBUMIN 4.2 12/14/2019 1111   ALBUMIN 4.5 01/13/2017 1024   AST 17 12/14/2019 1111   AST 12 (L) 11/10/2019 0812   ALT 16 12/14/2019 1111   ALT 10 11/10/2019 0812   ALKPHOS 66 12/14/2019 1111   BILITOT 0.8 12/14/2019 1111   BILITOT 0.6 11/10/2019 0812   GFRNONAA >60 12/14/2019 1111   GFRNONAA >60 11/10/2019 0812   GFRAA >60 12/14/2019 1111   GFRAA >60 11/10/2019 0812    No results found for: Ronnald Ramp, A1GS, A2GS, BETS, BETA2SER, GAMS, MSPIKE, SPEI  Lab Results  Component Value Date   WBC 5.7 01/10/2020   NEUTROABS 3.1 01/10/2020   HGB 12.5 01/10/2020   HCT 38.9 01/10/2020   MCV 87.4 01/10/2020   PLT 207 01/10/2020    No results found for: LABCA2  No components found for: GLOVFI433  No results for input(s): INR in the last 168 hours.  No results found for: LABCA2  No results found for: IRJ188  No  results found for: CZY606  No results found for: TKZ601  No results found for: CA2729  No components found for: HGQUANT  No results found for: CEA1 / No results found for: CEA1   No results found for: AFPTUMOR  No results found for: CHROMOGRNA  No results found for: KPAFRELGTCHN, LAMBDASER, KAPLAMBRATIO (kappa/lambda light chains)  No results found for: HGBA, HGBA2QUANT, HGBFQUANT, HGBSQUAN (Hemoglobinopathy evaluation)   No results found for: LDH  No results found for: IRON, TIBC, IRONPCTSAT (Iron and TIBC)  No results found for: FERRITIN  Urinalysis    Component Value Date/Time   COLORURINE YELLOW 06/17/2018 [place] 06/17/2018 1649   LABSPEC 1.010 06/17/2018 1649   PHURINE 7.0 06/17/2018 1649   GLUCOSEU NEGATIVE 06/17/2018 1649   HGBUR TRACE-INTACT (A) 06/17/2018 1649   BILIRUBINUR n 04/27/2019 0913   KETONESUR NEGATIVE 06/17/2018 1649   PROTEINUR Negative 04/27/2019 0913   UROBILINOGEN 0.2 04/27/2019 0913   UROBILINOGEN 0.2 06/17/2018 1649   NITRITE n 04/27/2019 0913   NITRITE NEGATIVE 06/17/2018 1649   LEUKOCYTESUR Negative 04/27/2019 0913     STUDIES: NM Sentinel Node Inj-No Rpt (Breast)  Result Date: 12/23/2019 Sulfur colloid was injected by the nuclear medicine technologist for melanoma sentinel node.   MM Breast Surgical Specimen  Result Date: 12/23/2019 CLINICAL DATA:  54 year old patient had 3 radioactive seeds placed on Dec 22, 2019 prior to lumpectomy of a grade 1 invasive mammary carcinoma and excisional biopsies of 2 high risk lesions. EXAM: SPECIMEN RADIOGRAPH OF THE RIGHT BREAST COMPARISON:  Previous exam(s). FINDINGS: Status post excision of the right breast. Three radioactive seeds, a coil shaped biopsy clip, a dumbbell-shaped biopsy clip, and a cylindrical shaped biopsy clip are present, completely intact, and were marked for pathology. IMPRESSION: Specimen radiograph of the right breast. Electronically Signed   By: Curlene Dolphin M.D.   On: 12/23/2019 16:00   MM Breast Surgical Specimen  Result Date: 12/23/2019 CLINICAL DATA:  54 year old patient had 3 radioactive seeds placed on  Dec 22, 2019 plot prior to lumpectomy of a grade 1 invasive mammary carcinoma and excisional biopsies of 2 high risk lesions EXAM: SPECIMEN RADIOGRAPH OF THE RIGHT BREAST COMPARISON:  Previous exam(s). FINDINGS: Status post excision of the right breast. Three radioactive seeds, a coil shaped biopsy clip, a dumbbell-shaped biopsy clip, and a cylindrical shaped biopsy clip are present, completely intact, and were marked for pathology. IMPRESSION: Specimen radiographs of the right breast. Electronically Signed   By: Curlene Dolphin M.D.   On: 12/23/2019 08:32   MM Breast Surgical Specimen  Result Date: 12/23/2019 CLINICAL DATA:  54 year old patient had 3 radioactive seeds placed on Dec 22, 2019 plot prior to lumpectomy of a grade 1 invasive mammary carcinoma and excisional biopsies of 2 high risk lesions EXAM: SPECIMEN RADIOGRAPH OF THE RIGHT BREAST COMPARISON:  Previous exam(s). FINDINGS: Status post excision of the right breast. Three radioactive seeds, a coil shaped biopsy clip, a dumbbell-shaped biopsy clip, and a cylindrical shaped biopsy clip are present, completely intact, and were marked for pathology. IMPRESSION: Specimen radiographs of the right breast. Electronically Signed   By: Curlene Dolphin M.D.   On: 12/23/2019 08:32   MM RT RADIOACTIVE SEED LOC MAMMO GUIDE  Result Date: 12/22/2019 CLINICAL DATA:  Total of 3 radioactive seeds were requested to be placed in the right breast prior to surgery. Biopsy-proven grade 1 invasive mammary carcinoma at the site of the coil shaped biopsy clip upper outer quadrant. Lobular neoplasia (atypical lobular hyperplasia and complex sclerosing lesion) in the upper-outer quadrant of the right breast with dumbbell clip placed. Lobular neoplasia (atypical lobular hyperplasia) diagnosed in the retroareolar right  breast with cylinder clip placed. EXAM: MAMMOGRAPHIC GUIDED RADIOACTIVE SEED LOCALIZATION OF THE RIGHT BREAST X 3 COMPARISON:  Previous exam(s). FINDINGS: Patient presents for radioactive seed localization prior to lumpectomy and excisional biopsies. I met with the patient and we discussed the procedure of seed localization including benefits and alternatives. We discussed the high likelihood of a successful procedure. We discussed the risks of the procedure including infection, bleeding, tissue injury and further surgery. We discussed the low dose of radioactivity involved in the procedure. Informed, written consent was given. The usual time-out protocol was performed immediately prior to the procedure. Using mammographic guidance, sterile technique, 1% lidocaine and an I-125 radioactive seed, coil shaped biopsy clip was localized using a lateral approach. The follow-up mammogram images confirm the seed in the expected location and were marked for Dr. Brantley Stage. Follow-up survey of the patient confirms presence of the radioactive seed. Order number of I-125 seed:  [id number]. Total activity:  4.098 millicuries reference Date: 07 Dec 2019 Using mammographic guidance, sterile technique, 1% lidocaine and an I-125 radioactive seed, a dumbbell-shaped biopsy clip in the anterior to middle third of the slightly outer right breast was localized using a lateral approach. An approximately 1 cm post biopsy hematoma is noted at this biopsy site. The follow-up mammogram images confirm the seed in the expected location and were marked for Dr. Brantley Stage. Follow-up survey of the patient confirms presence of the radioactive seed. Order number of I-125 seed:  [id number]. Total activity:  5.621 millicuries reference Date: 10 Dec 2019 Using mammographic guidance, sterile technique, 1% lidocaine and an I-125 radioactive seed, cylindrical shaped biopsy clip in the retroareolar right breast was localized using a lateral approach. The follow-up  mammogram images confirm the seed in the expected location and were marked for Dr. Brantley Stage. Follow-up survey of the patient confirms presence of the radioactive seed. Order  number of I-125 seed:  [id number]. Total activity:  4.098 millicuries reference Date: Dec 07, 2019 The patient tolerated the procedure well and was released from the [facility]. She was given instructions regarding seed removal. IMPRESSION: Radioactive seed localization right breast x 3. No apparent complications. Electronically Signed   By: Curlene Dolphin M.D.   On: 12/22/2019 14:34   MM RT RADIO SEED EA ADD LESION LOC MAMMO  Result Date: 12/22/2019 CLINICAL DATA:  Total of 3 radioactive seeds were requested to be placed in the right breast prior to surgery. Biopsy-proven grade 1 invasive mammary carcinoma at the site of the coil shaped biopsy clip upper outer quadrant. Lobular neoplasia (atypical lobular hyperplasia and complex sclerosing lesion) in the upper-outer quadrant of the right breast with dumbbell clip placed. Lobular neoplasia (atypical lobular hyperplasia) diagnosed in the retroareolar right breast with cylinder clip placed. EXAM: MAMMOGRAPHIC GUIDED RADIOACTIVE SEED LOCALIZATION OF THE RIGHT BREAST X 3 COMPARISON:  Previous exam(s). FINDINGS: Patient presents for radioactive seed localization prior to lumpectomy and excisional biopsies. I met with the patient and we discussed the procedure of seed localization including benefits and alternatives. We discussed the high likelihood of a successful procedure. We discussed the risks of the procedure including infection, bleeding, tissue injury and further surgery. We discussed the low dose of radioactivity involved in the procedure. Informed, written consent was given. The usual time-out protocol was performed immediately prior to the procedure. Using mammographic guidance, sterile technique, 1% lidocaine and an I-125 radioactive seed, coil shaped biopsy clip was localized using a  lateral approach. The follow-up mammogram images confirm the seed in the expected location and were marked for Dr. Brantley Stage. Follow-up survey of the patient confirms presence of the radioactive seed. Order number of I-125 seed:  [id number]. Total activity:  5.621 millicuries reference Date: 07 Dec 2019 Using mammographic guidance, sterile technique, 1% lidocaine and an I-125 radioactive seed, a dumbbell-shaped biopsy clip in the anterior to middle third of the slightly outer right breast was localized using a lateral approach. An approximately 1 cm post biopsy hematoma is noted at this biopsy site. The follow-up mammogram images confirm the seed in the expected location and were marked for Dr. Brantley Stage. Follow-up survey of the patient confirms presence of the radioactive seed. Order number of I-125 seed:  [id number]. Total activity:  9.629 millicuries reference Date: 10 Dec 2019 Using mammographic guidance, sterile technique, 1% lidocaine and an I-125 radioactive seed, cylindrical shaped biopsy clip in the retroareolar right breast was localized using a lateral approach. The follow-up mammogram images confirm the seed in the expected location and were marked for Dr. Brantley Stage. Follow-up survey of the patient confirms presence of the radioactive seed. Order number of I-125 seed:  [id number]. Total activity:  0.102 millicuries reference Date: Dec 07, 2019 The patient tolerated the procedure well and was released from the [facility]. She was given instructions regarding seed removal. IMPRESSION: Radioactive seed localization right breast x 3. No apparent complications. Electronically Signed   By: Curlene Dolphin M.D.   On: 12/22/2019 14:34   MM RT RADIO SEED EA ADD LESION LOC MAMMO  Result Date: 12/22/2019 CLINICAL DATA:  Total of 3 radioactive seeds were requested to be placed in the right breast prior to surgery. Biopsy-proven grade 1 invasive mammary carcinoma at the site of the coil shaped biopsy clip upper outer  quadrant. Lobular neoplasia (atypical lobular hyperplasia and complex sclerosing lesion) in the upper-outer quadrant of the right breast with dumbbell clip placed. Lobular  neoplasia (atypical lobular hyperplasia) diagnosed in the retroareolar right breast with cylinder clip placed. EXAM: MAMMOGRAPHIC GUIDED RADIOACTIVE SEED LOCALIZATION OF THE RIGHT BREAST X 3 COMPARISON:  Previous exam(s). FINDINGS: Patient presents for radioactive seed localization prior to lumpectomy and excisional biopsies. I met with the patient and we discussed the procedure of seed localization including benefits and alternatives. We discussed the high likelihood of a successful procedure. We discussed the risks of the procedure including infection, bleeding, tissue injury and further surgery. We discussed the low dose of radioactivity involved in the procedure. Informed, written consent was given. The usual time-out protocol was performed immediately prior to the procedure. Using mammographic guidance, sterile technique, 1% lidocaine and an I-125 radioactive seed, coil shaped biopsy clip was localized using a lateral approach. The follow-up mammogram images confirm the seed in the expected location and were marked for Dr. Brantley Stage. Follow-up survey of the patient confirms presence of the radioactive seed. Order number of I-125 seed:  [id number]. Total activity:  2.297 millicuries reference Date: 07 Dec 2019 Using mammographic guidance, sterile technique, 1% lidocaine and an I-125 radioactive seed, a dumbbell-shaped biopsy clip in the anterior to middle third of the slightly outer right breast was localized using a lateral approach. An approximately 1 cm post biopsy hematoma is noted at this biopsy site. The follow-up mammogram images confirm the seed in the expected location and were marked for Dr. Brantley Stage. Follow-up survey of the patient confirms presence of the radioactive seed. Order number of I-125 seed:  [id number]. Total activity:  7.408  millicuries reference Date: 10 Dec 2019 Using mammographic guidance, sterile technique, 1% lidocaine and an I-125 radioactive seed, cylindrical shaped biopsy clip in the retroareolar right breast was localized using a lateral approach. The follow-up mammogram images confirm the seed in the expected location and were marked for Dr. Brantley Stage. Follow-up survey of the patient confirms presence of the radioactive seed. Order number of I-125 seed:  [id number]. Total activity:  1.497 millicuries reference Date: Dec 07, 2019 The patient tolerated the procedure well and was released from the [facility]. She was given instructions regarding seed removal. IMPRESSION: Radioactive seed localization right breast x 3. No apparent complications. Electronically Signed   By: Curlene Dolphin M.D.   On: 12/22/2019 14:34     ELIGIBLE FOR AVAILABLE RESEARCH PROTOCOL: AET  ASSESSMENT: 54 y.o. Jearld Pies, [place] woman status post right breast upper outer quadrant biopsy 11/01/2019 for a clinical TX N0  invasive lobular carcinoma, grade 1, estrogen and progesterone receptor positive, HER-2 not amplified, with an MIB-1 of 2%.  (a) breast MRI 11/22/2019 showed 2 additional right breast lesions and one left breast lesion, all of which were biopsied and all of which were benign  (1) status post right lumpectomy 12/23/2019 for a pT1a pN0, stage IA invasive lobular breast cancer, grade 2, with negative margins.  (a) a total of 2 axillary lymph nodes were removed  (2) adjuvant radiation pending  (3) genetic testing 11/19/2019 through the [facility] Common Hereditary Cancers + Melanoma panel.  Found no deleterious mutations in APC, ATM, AXIN2, BARD1, BMPR1A, BRCA1, BRCA2, BRIP1, CDH1, CDKN2A (p14ARF), CDKN2A (p16INK4a), CKD4, CHEK2, CTNNA1, DICER1, EPCAM (Deletion/duplication testing only), GREM1 (promoter region deletion/duplication testing only), KIT, MEN1, MLH1, MSH2, MSH3, MSH6, MUTYH, NBN, NF1, NHTL1, PALB2, PDGFRA, PMS2, POLD1, POLE,  PTEN, RAD50, RAD51C, RAD51D, RNF43, SDHB, SDHC, SDHD, SMAD4, SMARCA4. STK11, TP53, TSC1, TSC2, and VHL.  The following genes were evaluated for sequence changes only: SDHA and HOXB13 c.251G>A variant only.  (a)  The [facility] Melanoma Panel analyzed the following 9 genes: BAP1 BRCA2 CDK4 CDKN2A MITF POT1 PTEN RB1 Tp53.  (b) VUS noted in APC, POLE, and RNF43   (4) antiestrogens to start at the completion of local treatment.  Likely we will use tamoxifen  (a) status post hysterectomy  PLAN: Kymorah did well with her surgery and is ready to start her radiations.  We discussed some of the possible side effects and complications of those treatments, which are generally well-tolerated.  Her cancer was less than 5 mm and therefore chemotherapy is not indicated.  We are not sending an Oncotype.  For systemic therapy as she will take antiestrogens.  This is optional according to NCCN guidelines but we are dealing with a lobular breast cancer so I think it will be important for her to receive this.  Since she has had a hysterectomy and is young I think she will do well with tamoxifen but we will discuss that in more detail when she returns to see me at the end of radiation.  Right now she is plagued with hot flashes.  We are starting gabapentin at bedtime only.  I think that will significantly relieve the nighttime hot flashes.  If she wishes we can later add venlafaxine but my vote would be to wait a little until she returns to see me and by then her hot flashes may have "burned out" a little bit".  She knows to call for any other issue that may develop before the next visit  Total encounter time 35 minutes.Sarajane Jews C. Arcenia Scarbro, MD 01/10/2020 8:42 AM Medical Oncology and Hematology [facility] [place], [place] [address] Tel. [phone]    Fax. [phone]   This document serves as a record of services personally performed by Lurline Del, MD. It was created on his  behalf by Wilburn Mylar, a trained medical scribe. The creation of this record is based on the scribe's personal observations and the provider's statements to them.   I, Lurline Del MD, have reviewed the above documentation for accuracy and completeness, and I agree with the above.   *Total Encounter Time as defined by the Centers for Medicare and Medicaid Services includes, in addition to the face-to-face time of a patient visit (documented in the note above) non-face-to-face time: obtaining and reviewing outside history, ordering and reviewing medications, tests or procedures, care coordination (communications with other health care professionals or caregivers) and documentation in the medical record.

## 2020-01-10 ENCOUNTER — Other Ambulatory Visit: Payer: Self-pay

## 2020-01-10 ENCOUNTER — Inpatient Hospital Stay: Payer: BC Managed Care – PPO | Attending: Oncology

## 2020-01-10 ENCOUNTER — Inpatient Hospital Stay (HOSPITAL_BASED_OUTPATIENT_CLINIC_OR_DEPARTMENT_OTHER): Payer: BC Managed Care – PPO | Admitting: Oncology

## 2020-01-10 VITALS — BP 146/79 | HR 64 | Temp 98.3°F | Resp 17 | Ht 66.0 in | Wt 189.6 lb

## 2020-01-10 DIAGNOSIS — C50411 Malignant neoplasm of upper-outer quadrant of right female breast: Secondary | ICD-10-CM | POA: Insufficient documentation

## 2020-01-10 DIAGNOSIS — Z170 Estrogen receptor positive status [ER+]: Secondary | ICD-10-CM

## 2020-01-10 DIAGNOSIS — Z90710 Acquired absence of both cervix and uterus: Secondary | ICD-10-CM

## 2020-01-10 DIAGNOSIS — Z79899 Other long term (current) drug therapy: Secondary | ICD-10-CM | POA: Diagnosis not present

## 2020-01-10 DIAGNOSIS — Z90722 Acquired absence of ovaries, bilateral: Secondary | ICD-10-CM | POA: Diagnosis not present

## 2020-01-10 DIAGNOSIS — R232 Flushing: Secondary | ICD-10-CM | POA: Insufficient documentation

## 2020-01-10 LAB — COMPREHENSIVE METABOLIC PANEL
ALT: 13 U/L (ref 0–44)
AST: 14 U/L — ABNORMAL LOW (ref 15–41)
Albumin: 3.7 g/dL (ref 3.5–5.0)
Alkaline Phosphatase: 76 U/L (ref 38–126)
Anion gap: 9 (ref 5–15)
BUN: 17 mg/dL (ref 6–20)
CO2: 25 mmol/L (ref 22–32)
Calcium: 9.1 mg/dL (ref 8.9–10.3)
Chloride: 108 mmol/L (ref 98–111)
Creatinine, Ser: 0.9 mg/dL (ref 0.44–1.00)
GFR calc Af Amer: >60 mL/min (ref >60)
GFR calc non Af Amer: >60 mL/min (ref >60)
Glucose, Bld: 101 mg/dL — ABNORMAL HIGH (ref 70–99)
Potassium: 4.4 mmol/L (ref 3.5–5.1)
Sodium: 142 mmol/L (ref 135–145)
Total Bilirubin: 0.5 mg/dL (ref 0.3–1.2)
Total Protein: 7 g/dL (ref 6.5–8.1)

## 2020-01-10 LAB — CBC WITH DIFFERENTIAL/PLATELET
Abs Immature Granulocytes: 0.01 10*3/uL (ref 0.00–0.07)
Basophils Absolute: 0.1 10*3/uL (ref 0.0–0.1)
Basophils Relative: 1 %
Eosinophils Absolute: 0.3 10*3/uL (ref 0.0–0.5)
Eosinophils Relative: 5 %
HCT: 38.9 % (ref 36.0–46.0)
Hemoglobin: 12.5 g/dL (ref 12.0–15.0)
Immature Granulocytes: 0 %
Lymphocytes Relative: 31 %
Lymphs Abs: 1.8 10*3/uL (ref 0.7–4.0)
MCH: 28.1 pg (ref 26.0–34.0)
MCHC: 32.1 g/dL (ref 30.0–36.0)
MCV: 87.4 fL (ref 80.0–100.0)
Monocytes Absolute: 0.4 10*3/uL (ref 0.1–1.0)
Monocytes Relative: 7 %
Neutro Abs: 3.1 10*3/uL (ref 1.7–7.7)
Neutrophils Relative %: 56 %
Platelets: 207 10*3/uL (ref 150–400)
RBC: 4.45 MIL/uL (ref 3.87–5.11)
RDW: 12.6 % (ref 11.5–15.5)
WBC: 5.7 10*3/uL (ref 4.0–10.5)
nRBC: 0 % (ref 0.0–0.2)

## 2020-01-10 MED ORDER — GABAPENTIN 300 MG PO CAPS
300.0000 mg | ORAL_CAPSULE | Freq: Every day | ORAL | 4 refills | Status: DC
Start: 2020-01-10 — End: 2021-03-16

## 2020-01-11 ENCOUNTER — Telehealth: Payer: Self-pay | Admitting: Oncology

## 2020-01-11 NOTE — Telephone Encounter (Signed)
Scheduled appts per 6/14 los. Pt confirmed appt date and times.

## 2020-01-12 NOTE — Progress Notes (Signed)
Radiation Oncology         [phone] ________________________________  Name: Alexis Young MRN: [id number]  Date: 01/13/2020  DOB: 1965-12-11  Re-Evaluation Note  CC: Binnie Rail, MD  Erroll Luna, MD    ICD-10-CM   1. Malignant neoplasm of upper-outer quadrant of right breast in female, estrogen receptor positive ([facility])  C50.411    Z17.0     Diagnosis: Stage IA (pT1a, pN0) Right Breast UOQ, Invasive Lobular Carcinoma with LCIS, ER+ / PR+ / Her2-, Grade 2  Narrative:  The patient returns today to discuss radiation treatment options. She was seen in the multidisciplinary breast clinic on 11/10/2019. At that time, it was recommended that the patient proceed with MRI (lobular dx), right lumpectomy with sentinel node biopsy, oncoptype depending on final tumor size, adjuvant radiation therapy, and aromatase inhibitor.  Genetics testing on 11/19/2019 was negative and there were no pathogenic variants identified.  MRI of bilateral breasts on 11/19/2019 showed biopsy-proven malignancy in the upper outer right breast at middle depth. There were also noted to be two low-suspicious but indeterminate 5 mm enhancing foci in the anterior right breast in addition to an indeterminate 8 mm enhancing mass in the central left breast. There was no suspicious lymphadenopathy.   The patient underwent an MRI-guided core needle biopsy of the bilateral breasts on 12/02/2019. Biopsy of the left superior central breast showed fibroadenoma and fibrocystic change without malignancy. Biopsy of the right upper outer breast showed lobular neoplasia (atypical lobular hyperplasia) and complex sclerosing lesion with fibrocystic change and usual ductal hyperplasia. Finally, biopsy of right retro-areolar area showed lobular neoplasia (atypical lobular hyperplasia), fibrocystic change, and pseud-oangiomatous stromal hyperplasia.  The patient underwent a right breast lumpectomy x3 with right axillary sentinel lymph  node mapping on 12/23/2019 performed by Dr. Brantley Stage. Pathology from the procedure revealed foci of atypical lobular hyperplasia and background breast parenchyma with fibrocystic changes including sclerosing adenosis, usual ductal hyperplasia, and complex sclerosing of the right medial breast; negative for carcinoma. Biopsy of the right lateral breast revealed grade 2 invasive lobular carcinoma with lobular carcinoma in-situ. The resection margins were negative for carcinoma; anterior resection edge at 55 mm and superior and posterior resection edges at 55 mm. It also showed background breast parenchyma with fibrocystic changes including adenosis and calcifications. The right posterior margins showed focal atypical locular hyperplasia with background breast parenchyma with fibrocystic changes; negative for carcinoma. The right medial, inferior, lateral, and superior margins all showed breast parenchyma with fibrocystic changes. The superior margin also had some calcifications. Two right axillary sentinel lymph nodes were biopsied and both were negative for carcinoma.  The patient was seen by Dr. Jana Hakim on 01/10/2020, during which time he recommended that she proceed with radiation therapy followed by antiestrogen therapy likely with Tamoxifen. Oncoptype DX was not warranted given that the tumor was less than 5 mm.  On review of systems, the patient reports soreness at surgical site. She denies lymphedema and any other symptoms.    Allergies:  is allergic to tetracyclines & related, asa [aspirin], and sulfa antibiotics.  Meds: Current Outpatient Medications  Medication Sig Dispense Refill  . gabapentin (NEURONTIN) 300 MG capsule Take 1 capsule (300 mg total) by mouth at bedtime. 90 capsule 4  . levothyroxine (SYNTHROID) 50 MCG tablet TAKE 1 TABLET BY MOUTH EVERY DAY 90 tablet 1  . Polyethyl Glycol-Propyl Glycol (SYSTANE OP) Place 1 drop into both eyes daily as needed (dry eyes).    . tretinoin (RETIN-A)  0.025 %  cream APPLY A PEA SIZE AMOUNT TO AFFECTED AREAS NIGHTLY.     No current facility-administered medications for this encounter.    Physical Findings: The patient is in no acute distress. Patient is alert and oriented.  height is '5\' 6"'$  (1.676 m) and weight is 188 lb (85.3 kg). Her temperature is 97.9 F (36.6 C). Her blood pressure is 143/85 (abnormal) and her pulse is 84. Her respiration is 18 and oxygen saturation is 100%.  No significant changes. Lungs are clear to auscultation bilaterally. Heart has regular rate and rhythm. No palpable cervical, supraclavicular, or axillary adenopathy. Abdomen soft, non-tender, normal bowel sounds. Left breast: no palpable mass, nipple discharge or bleeding. Right breast: Well-healing scar in the lateral aspect of the breast. Patient has a separate scar in the axillary area from her sentinel node procedure. No signs of infection within the breast no nipple discharge or bleeding no drainage from the lumpectomy scar.  Lab Findings: Lab Results  Component Value Date   WBC 5.7 01/10/2020   HGB 12.5 01/10/2020   HCT 38.9 01/10/2020   MCV 87.4 01/10/2020   PLT 207 01/10/2020    Radiographic Findings: NM Sentinel Node Inj-No Rpt (Breast)  Result Date: 12/23/2019 Sulfur colloid was injected by the nuclear medicine technologist for melanoma sentinel node.   MM Breast Surgical Specimen  Result Date: 12/23/2019 CLINICAL DATA:  54 year old patient had 3 radioactive seeds placed on Dec 22, 2019 prior to lumpectomy of a grade 1 invasive mammary carcinoma and excisional biopsies of 2 high risk lesions. EXAM: SPECIMEN RADIOGRAPH OF THE RIGHT BREAST COMPARISON:  Previous exam(s). FINDINGS: Status post excision of the right breast. Three radioactive seeds, a coil shaped biopsy clip, a dumbbell-shaped biopsy clip, and a cylindrical shaped biopsy clip are present, completely intact, and were marked for pathology. IMPRESSION: Specimen radiograph of the right breast.  Electronically Signed   By: Curlene Dolphin M.D.   On: 12/23/2019 16:00   MM Breast Surgical Specimen  Result Date: 12/23/2019 CLINICAL DATA:  54 year old patient had 3 radioactive seeds placed on Dec 22, 2019 plot prior to lumpectomy of a grade 1 invasive mammary carcinoma and excisional biopsies of 2 high risk lesions EXAM: SPECIMEN RADIOGRAPH OF THE RIGHT BREAST COMPARISON:  Previous exam(s). FINDINGS: Status post excision of the right breast. Three radioactive seeds, a coil shaped biopsy clip, a dumbbell-shaped biopsy clip, and a cylindrical shaped biopsy clip are present, completely intact, and were marked for pathology. IMPRESSION: Specimen radiographs of the right breast. Electronically Signed   By: Curlene Dolphin M.D.   On: 12/23/2019 08:32   MM Breast Surgical Specimen  Result Date: 12/23/2019 CLINICAL DATA:  54 year old patient had 3 radioactive seeds placed on Dec 22, 2019 plot prior to lumpectomy of a grade 1 invasive mammary carcinoma and excisional biopsies of 2 high risk lesions EXAM: SPECIMEN RADIOGRAPH OF THE RIGHT BREAST COMPARISON:  Previous exam(s). FINDINGS: Status post excision of the right breast. Three radioactive seeds, a coil shaped biopsy clip, a dumbbell-shaped biopsy clip, and a cylindrical shaped biopsy clip are present, completely intact, and were marked for pathology. IMPRESSION: Specimen radiographs of the right breast. Electronically Signed   By: Curlene Dolphin M.D.   On: 12/23/2019 08:32   MM RT RADIOACTIVE SEED LOC MAMMO GUIDE  Result Date: 12/22/2019 CLINICAL DATA:  Total of 3 radioactive seeds were requested to be placed in the right breast prior to surgery. Biopsy-proven grade 1 invasive mammary carcinoma at the site of the coil shaped biopsy clip  upper outer quadrant. Lobular neoplasia (atypical lobular hyperplasia and complex sclerosing lesion) in the upper-outer quadrant of the right breast with dumbbell clip placed. Lobular neoplasia (atypical lobular hyperplasia)  diagnosed in the retroareolar right breast with cylinder clip placed. EXAM: MAMMOGRAPHIC GUIDED RADIOACTIVE SEED LOCALIZATION OF THE RIGHT BREAST X 3 COMPARISON:  Previous exam(s). FINDINGS: Patient presents for radioactive seed localization prior to lumpectomy and excisional biopsies. I met with the patient and we discussed the procedure of seed localization including benefits and alternatives. We discussed the high likelihood of a successful procedure. We discussed the risks of the procedure including infection, bleeding, tissue injury and further surgery. We discussed the low dose of radioactivity involved in the procedure. Informed, written consent was given. The usual time-out protocol was performed immediately prior to the procedure. Using mammographic guidance, sterile technique, 1% lidocaine and an I-125 radioactive seed, coil shaped biopsy clip was localized using a lateral approach. The follow-up mammogram images confirm the seed in the expected location and were marked for Dr. Brantley Stage. Follow-up survey of the patient confirms presence of the radioactive seed. Order number of I-125 seed:  [id number]. Total activity:  4.098 millicuries reference Date: 07 Dec 2019 Using mammographic guidance, sterile technique, 1% lidocaine and an I-125 radioactive seed, a dumbbell-shaped biopsy clip in the anterior to middle third of the slightly outer right breast was localized using a lateral approach. An approximately 1 cm post biopsy hematoma is noted at this biopsy site. The follow-up mammogram images confirm the seed in the expected location and were marked for Dr. Brantley Stage. Follow-up survey of the patient confirms presence of the radioactive seed. Order number of I-125 seed:  [id number]. Total activity:  5.621 millicuries reference Date: 10 Dec 2019 Using mammographic guidance, sterile technique, 1% lidocaine and an I-125 radioactive seed, cylindrical shaped biopsy clip in the retroareolar right breast was localized  using a lateral approach. The follow-up mammogram images confirm the seed in the expected location and were marked for Dr. Brantley Stage. Follow-up survey of the patient confirms presence of the radioactive seed. Order number of I-125 seed:  [id number]. Total activity:  9.629 millicuries reference Date: Dec 07, 2019 The patient tolerated the procedure well and was released from the [place]. She was given instructions regarding seed removal. IMPRESSION: Radioactive seed localization right breast x 3. No apparent complications. Electronically Signed   By: Curlene Dolphin M.D.   On: 12/22/2019 14:34   MM RT RADIO SEED EA ADD LESION LOC MAMMO  Result Date: 12/22/2019 CLINICAL DATA:  Total of 3 radioactive seeds were requested to be placed in the right breast prior to surgery. Biopsy-proven grade 1 invasive mammary carcinoma at the site of the coil shaped biopsy clip upper outer quadrant. Lobular neoplasia (atypical lobular hyperplasia and complex sclerosing lesion) in the upper-outer quadrant of the right breast with dumbbell clip placed. Lobular neoplasia (atypical lobular hyperplasia) diagnosed in the retroareolar right breast with cylinder clip placed. EXAM: MAMMOGRAPHIC GUIDED RADIOACTIVE SEED LOCALIZATION OF THE RIGHT BREAST X 3 COMPARISON:  Previous exam(s). FINDINGS: Patient presents for radioactive seed localization prior to lumpectomy and excisional biopsies. I met with the patient and we discussed the procedure of seed localization including benefits and alternatives. We discussed the high likelihood of a successful procedure. We discussed the risks of the procedure including infection, bleeding, tissue injury and further surgery. We discussed the low dose of radioactivity involved in the procedure. Informed, written consent was given. The usual time-out protocol was performed immediately prior to the procedure.  Using mammographic guidance, sterile technique, 1% lidocaine and an I-125 radioactive seed, coil  shaped biopsy clip was localized using a lateral approach. The follow-up mammogram images confirm the seed in the expected location and were marked for Dr. Brantley Stage. Follow-up survey of the patient confirms presence of the radioactive seed. Order number of I-125 seed:  [id number]. Total activity:  9.390 millicuries reference Date: 07 Dec 2019 Using mammographic guidance, sterile technique, 1% lidocaine and an I-125 radioactive seed, a dumbbell-shaped biopsy clip in the anterior to middle third of the slightly outer right breast was localized using a lateral approach. An approximately 1 cm post biopsy hematoma is noted at this biopsy site. The follow-up mammogram images confirm the seed in the expected location and were marked for Dr. Brantley Stage. Follow-up survey of the patient confirms presence of the radioactive seed. Order number of I-125 seed:  [id number]. Total activity:  7.622 millicuries reference Date: 10 Dec 2019 Using mammographic guidance, sterile technique, 1% lidocaine and an I-125 radioactive seed, cylindrical shaped biopsy clip in the retroareolar right breast was localized using a lateral approach. The follow-up mammogram images confirm the seed in the expected location and were marked for Dr. Brantley Stage. Follow-up survey of the patient confirms presence of the radioactive seed. Order number of I-125 seed:  [id number]. Total activity:  5.638 millicuries reference Date: Dec 07, 2019 The patient tolerated the procedure well and was released from the [place]. She was given instructions regarding seed removal. IMPRESSION: Radioactive seed localization right breast x 3. No apparent complications. Electronically Signed   By: Curlene Dolphin M.D.   On: 12/22/2019 14:34   MM RT RADIO SEED EA ADD LESION LOC MAMMO  Result Date: 12/22/2019 CLINICAL DATA:  Total of 3 radioactive seeds were requested to be placed in the right breast prior to surgery. Biopsy-proven grade 1 invasive mammary carcinoma at the site of  the coil shaped biopsy clip upper outer quadrant. Lobular neoplasia (atypical lobular hyperplasia and complex sclerosing lesion) in the upper-outer quadrant of the right breast with dumbbell clip placed. Lobular neoplasia (atypical lobular hyperplasia) diagnosed in the retroareolar right breast with cylinder clip placed. EXAM: MAMMOGRAPHIC GUIDED RADIOACTIVE SEED LOCALIZATION OF THE RIGHT BREAST X 3 COMPARISON:  Previous exam(s). FINDINGS: Patient presents for radioactive seed localization prior to lumpectomy and excisional biopsies. I met with the patient and we discussed the procedure of seed localization including benefits and alternatives. We discussed the high likelihood of a successful procedure. We discussed the risks of the procedure including infection, bleeding, tissue injury and further surgery. We discussed the low dose of radioactivity involved in the procedure. Informed, written consent was given. The usual time-out protocol was performed immediately prior to the procedure. Using mammographic guidance, sterile technique, 1% lidocaine and an I-125 radioactive seed, coil shaped biopsy clip was localized using a lateral approach. The follow-up mammogram images confirm the seed in the expected location and were marked for Dr. Brantley Stage. Follow-up survey of the patient confirms presence of the radioactive seed. Order number of I-125 seed:  [id number]. Total activity:  8.115 millicuries reference Date: 07 Dec 2019 Using mammographic guidance, sterile technique, 1% lidocaine and an I-125 radioactive seed, a dumbbell-shaped biopsy clip in the anterior to middle third of the slightly outer right breast was localized using a lateral approach. An approximately 1 cm post biopsy hematoma is noted at this biopsy site. The follow-up mammogram images confirm the seed in the expected location and were marked for Dr. Brantley Stage. Follow-up survey of the patient  confirms presence of the radioactive seed. Order number of I-125  seed:  [id number]. Total activity:  7.076 millicuries reference Date: 10 Dec 2019 Using mammographic guidance, sterile technique, 1% lidocaine and an I-125 radioactive seed, cylindrical shaped biopsy clip in the retroareolar right breast was localized using a lateral approach. The follow-up mammogram images confirm the seed in the expected location and were marked for Dr. Brantley Stage. Follow-up survey of the patient confirms presence of the radioactive seed. Order number of I-125 seed:  [id number]. Total activity:  5.789 millicuries reference Date: Dec 07, 2019 The patient tolerated the procedure well and was released from the [facility]. She was given instructions regarding seed removal. IMPRESSION: Radioactive seed localization right breast x 3. No apparent complications. Electronically Signed   By: Curlene Dolphin M.D.   On: 12/22/2019 14:34    Impression: Stage IA (pT1a, pN0) Right Breast UOQ, Invasive Lobular Carcinoma with LCIS, ER+ / PR+ / Her2-, Grade 2  Patient would be a good candidate for breast conservation with adjuvant radiation therapy directed to the right breast. I discussed the general course of treatment side effects and potential toxicities of radiation therapy in this situation with the patient. She appears to understand and wishes to proceed with planned course of treatment. Patient has good range of movement of her right arm and shoulder and looks like she is healed well enough to proceed with CT simulation later today. She would appear to be a good candidate for hypofractionated accelerated radiation therapy over approximately 4 weeks.  Plan:  Patient is scheduled for CT simulation later today. Treatments to begin June 28.  Total time spent in this encounter was 30 minutes which included reviewing the patient's most recent MRI, surgery, lab results, interval history, physical examination, and documentation.  -----------------------------------  Blair Promise, PhD, MD  This  document serves as a record of services personally performed by Gery Pray, MD. It was created on his behalf by Clerance Lav, a trained medical scribe. The creation of this record is based on the scribe's personal observations and the provider's statements to them. This document has been checked and approved by the attending provider.

## 2020-01-12 NOTE — Progress Notes (Signed)
Patient is here for a consult with Dr. Sondra Come.  Alexis Pray, MD  Physician  Radiation Oncology  Progress Notes    Signed  Date of Service:  11/10/2019 10:00 AM          Signed      Expand AllCollapse All    Show:Clear all '[x]'$ Manual'[x]'$ Template'[x]'$ Copied  Added by: '[x]'$ Kahn, Maleeha'[x]'$ Kinard, James, MD  '[]'$ Hover for details  Radiation Oncology         [phone]) [phone] ________________________________  Multidisciplinary Breast Oncology Clinic [facility]) Initial Outpatient Consultation  Name: Alexis Young      MRN: [name]         Date: 11/10/2019                      DOB: [birth year]  19/10/1965, IR:JJOAC, MD  Claudina Lick, MD   REFERRING PHYSICIAN: Erroll Luna, MD  DIAGNOSIS: The encounter diagnosis was Malignant neoplasm of upper-outer quadrant of right breast in female, estrogen receptor positive ([place]).  Stage Tx Right Breast UOQ, Invasive Lobular Carcinoma, ER+ / PR+ / Her2-, Grade 1    ICD-10-CM   1. Malignant neoplasm of upper-outer quadrant of right breast in female, estrogen receptor positive ([place])  C50.411    Z17.0     HISTORY OF PRESENT ILLNESS::Alexis Young is a 54 y.o. female who is presenting to the office today for evaluation of her newly diagnosed breast cancer. She is accompanied by her husband. She is doing well overall.   She had routine screening mammography on 10/14/2019 showing a possible asymmetry in the right breast and a possible mass and asymmetry in the left breast. She underwent bilateral diagnostic mammography with tomography and bilateral breast ultrasonography at [facility] on 10/27/2019 that showed a small area of asymmetry with pronounced focal distortion in the outer right breast with no ultrasound correlate and benign left breast cysts.  Biopsy on 11/01/2019 showed grade 1 invasive mammary carcinoma and fibroadenoma. Prognostic indicators significant for estrogen receptor, 80% positive and progesterone  receptor, 80% positive, both with strong staining intensities. Proliferation marker Ki67 at 2%. HER2 negative.  Menarche: 54 years old Age at first live birth: 54 years old GP: 1 LMP: 2009 Contraceptive: Yes, for 14 years HRT: Yes, for 12 years  The patient was referred today for presentation in the multidisciplinary conference.  Radiology studies and pathology slides were presented there for review and discussion of treatment options.  A consensus was discussed regarding potential next steps.  PREVIOUS RADIATION THERAPY: No  PAST MEDICAL HISTORY:      Past Medical History:  Diagnosis Date  . Arthritis   . Cancer ([place]) 03/2013, 06/2013   malignant melanoma on right arm and back  . Dyspareunia   . Elevated serum creatinine   . Family history of colon cancer   . Family history of lung cancer   . Family history of melanoma   . Family history of pancreatic cancer   . Family history of stomach cancer   . Family history of uterine cancer   . Fibroid   . GERD (gastroesophageal reflux disease)    takes protonix as needed   . 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 Tennis elbow 2017   Right  . Tinnitus 2020   both ears    PAST SURGICAL HISTORY:      Past Surgical History:  Procedure Laterality Date  . BILATERAL SALPINGECTOMY Bilateral 02/14/2015   Procedure: BILATERAL SALPINGECTOMY OOPHORECTOMY;  Surgeon: Nunzio Cobbs, MD;  Location: [facility];  Service: Gynecology;  Laterality: Bilateral;  . BREAST BIOPSY  1987   Dr. Rosana Hoes  . BREAST EXCISIONAL BIOPSY Left    benign  . BREAST SURGERY Right 04/2010   breast biopsy for multiple fibroadenoma - no atypia  . CATARACT EXTRACTION, BILATERAL    . CESAREAN SECTION    . CRYOTHERAPY  5/91   for persistent condyloma atypia  . CYSTOSCOPY N/A 02/14/2015   Procedure: CYSTOSCOPY;  Surgeon: Nunzio Cobbs, MD;  Location: [facility];  Service: Gynecology;  Laterality:  N/A;  . LASER ABLATION OF CONDYLOMAS  03/05/90 Dr. 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 MELANOMA EXCISION Right 03/2013   melanoma right arm  . ROBOTIC ASSISTED TOTAL HYSTERECTOMY N/A 02/14/2015   Procedure: ROBOTIC ASSISTED TOTAL HYSTERECTOMY;  Surgeon: Nunzio Cobbs, MD;  Location: [facility];  Service: Gynecology;  Laterality: N/A;  . SKIN CANCER EXCISION  06/2013   midddle of back with path report of atypia  . TUBAL LIGATION Bilateral   . WISDOM TOOTH EXTRACTION      FAMILY HISTORY:       Family History  Problem Relation Age of Onset  . Cancer Mother        lung and liver  . Alcohol abuse Father        Cirrhosis of liver  . Cancer Maternal Aunt        stomach cancer  . Cancer Maternal Grandmother        uterine cancer  . Heart failure Paternal Grandmother   . Lung cancer Maternal Uncle   . Lung cancer Maternal Uncle   . Pancreatic cancer Paternal Uncle   . Testicular cancer Cousin   . Melanoma Cousin        of eye  . Cancer Cousin        possible colon? dx 36s  . Colon cancer Other   . Breast cancer Neg Hx     SOCIAL HISTORY:  Social History        Socioeconomic History  . Marital status: Married    Spouse name: Not on file  . Number of children: 1  . Years of education: Not on file  . Highest education level: Not on file  Occupational History    Employer: [employer]  Tobacco Use  . Smoking status: Never Smoker  . Smokeless tobacco: Never Used  Substance and Sexual Activity  . Alcohol use: No  . Drug use: No  . Sexual activity: Yes    Partners: Male    Birth control/protection: Surgical    Comment: Hysterectomy  Other Topics Concern  . Not on file  Social History Narrative  . Not on file   Social Determinants of Health      Financial Resource Strain:   . Difficulty of Paying Living Expenses:   Food Insecurity:   . Worried About Charity fundraiser in the Last Year:   .  Arboriculturist in the Last Year:   Transportation Needs:   . Film/video editor (Medical):   Marland Kitchen Lack of Transportation (Non-Medical):   Physical Activity:   . Days of Exercise per Week:   . Minutes of Exercise per Session:   Stress:   . Feeling of Stress :   Social Connections:   . Frequency of Communication with Friends and Family:   . Frequency of Social Gatherings with  Friends and Family:   . Attends Religious Services:   . Active Member of Clubs or Organizations:   . Attends Archivist Meetings:   Marland Kitchen Marital Status:     ALLERGIES:      Allergies  Allergen Reactions  . Tetracyclines & Related Nausea Only  . Asa [Aspirin] Nausea And Vomiting and Rash  . Sulfa Antibiotics Rash    MEDICATIONS:        Current Outpatient Medications  Medication Sig Dispense Refill  . estradiol (ESTRACE) 1 MG tablet TAKE 1 TABLET (1 MG TOTAL) BY MOUTH DAILY. 90 tablet 3  . levothyroxine (SYNTHROID) 50 MCG tablet TAKE 1 TABLET BY MOUTH EVERY DAY 90 tablet 1  . LORazepam (ATIVAN) 0.5 MG tablet Take 1 tablet (0.5 mg total) by mouth once for 1 dose. 2 tablet 0  . tretinoin (RETIN-A) 0.025 % cream APPLY A PEA SIZE AMOUNT TO AFFECTED AREAS NIGHTLY.    Marland Kitchen VITAMIN D PO Take by mouth.     No current facility-administered medications for this encounter.    REVIEW OF SYSTEMS: A 10+ POINT REVIEW OF SYSTEMS WAS OBTAINED including neurology, dermatology, psychiatry, cardiac, respiratory, lymph, extremities, GI, GU, musculoskeletal, constitutional, reproductive, HEENT. On the provided form, she reports hearing loss and hematuria. She also reports history of wearing glasses, posterior vitreous detachment, skin cancer (melanoma), arthritis, and thyroid problems. She denies fever, chills, abdominal pain, chest pain, nausea, vomiting, diarrhea, and any other symptoms.  Patient denies any pain within the breast area nipple discharge or bleeding prior to biopsy.  Patient understands she will  need to stop her Estrace tablet.   PHYSICAL EXAM:  Vitals with BMI 11/10/2019  Height '5\' 5"'$   Weight 188 lbs 10 oz  BMI 87.86  Systolic 767  Diastolic 58  Pulse 62   Lungs are clear to auscultation bilaterally. Heart has regular rate and rhythm. No palpable cervical, supraclavicular, or axillary adenopathy. Abdomen soft, non-tender, normal bowel sounds. Left breast is pendulous with no palpable mass, nipple discharge, or bleeding.  Right breast is pendulous with bruising in the upper outer quadrant. No palpable mass, nipple discharge, or bleeding. There is a scar on the patient's right forearm and right shoulder from prior melanoma mohs surgery.  KPS = 90  100 - Normal; no complaints; no evidence of disease. 90   - Able to carry on normal activity; minor signs or symptoms of disease. 80   - Normal activity with effort; some signs or symptoms of disease. 99   - Cares for self; unable to carry on normal activity or to do active work. 60   - Requires occasional assistance, but is able to care for most of his personal needs. 50   - Requires considerable assistance and frequent medical care. 26   - Disabled; requires special care and assistance. 75   - Severely disabled; hospital admission is indicated although death not imminent. 70   - Very sick; hospital admission necessary; active supportive treatment necessary. 10   - Moribund; fatal processes progressing rapidly. 0     - Dead  Karnofsky DA, Abelmann [place], Craver LS and [facility] [phone]) The use of the nitrogen mustards in the palliative treatment of carcinoma: with particular reference to bronchogenic carcinoma Cancer 1 634-56          Past/Anticipated interventions by surgeon, if [id number] 12/23/2019  Past/Anticipated interventions by medical oncology, if any: no chemotherapy  Lymphedema issues, if any:  no Pain issues, if any: sore at surgical  site  SAFETY ISSUES:  Prior radiation?no   Pacemaker/ICD?  no  Possible current pregnancy? Hysterectomy  Is the patient on methotrexate?no  Current Complaints / other details:  BP (!) 143/85 (BP Location: Left Arm, Patient Position: Sitting, Cuff Size: Normal)   Pulse 84   Temp 97.9 F (36.6 C)   Resp 18   Ht '5\' 6"'$  (1.676 m)   Wt 188 lb (85.3 kg)   LMP 10/15/2014 (Exact Date)   SpO2 100%   BMI 30.34 kg/m  Filed Weights   01/13/20 1433  Weight: 188 lb (85.3 kg)   Patient in for consult will have her CT scan today for planning.    De Burrs, RN 01/12/2020,2:20 PM   Vs  wt

## 2020-01-13 ENCOUNTER — Encounter: Payer: Self-pay | Admitting: Radiation Oncology

## 2020-01-13 ENCOUNTER — Other Ambulatory Visit: Payer: Self-pay

## 2020-01-13 ENCOUNTER — Ambulatory Visit
Admission: RE | Admit: 2020-01-13 | Discharge: 2020-01-13 | Disposition: A | Discharge status: Home / Self Care | Payer: BC Managed Care – PPO | Source: Ambulatory Visit | Attending: Radiation Oncology | Admitting: Radiation Oncology

## 2020-01-13 VITALS — BP 143/85 | HR 84 | Temp 97.9°F | Resp 18 | Ht 66.0 in | Wt 188.0 lb

## 2020-01-13 DIAGNOSIS — Z170 Estrogen receptor positive status [ER+]: Secondary | ICD-10-CM | POA: Insufficient documentation

## 2020-01-13 DIAGNOSIS — Z923 Personal history of irradiation: Secondary | ICD-10-CM | POA: Diagnosis not present

## 2020-01-13 DIAGNOSIS — C50411 Malignant neoplasm of upper-outer quadrant of right female breast: Secondary | ICD-10-CM

## 2020-01-13 DIAGNOSIS — Z510 Encounter for antineoplastic radiation therapy: Secondary | ICD-10-CM | POA: Diagnosis present

## 2020-01-13 DIAGNOSIS — Z79899 Other long term (current) drug therapy: Secondary | ICD-10-CM | POA: Diagnosis not present

## 2020-01-13 NOTE — Patient Instructions (Signed)
Coronavirus (COVID-19) Are you at risk?  Are you at risk for the Coronavirus (COVID-19)?  To be considered HIGH RISK for Coronavirus (COVID-19), you have to meet the following criteria:  . Traveled to [place], [place], [place], [place] or [place]; or in the [place] to [place], [place], [place], or [place]; and have fever, cough, and shortness of breath within the last 2 weeks of travel OR . Been in close contact with a person diagnosed with COVID-19 within the last 2 weeks and have fever, cough, and shortness of breath . IF YOU DO NOT MEET THESE CRITERIA, YOU ARE CONSIDERED LOW RISK FOR COVID-19.  What to do if you are HIGH RISK for COVID-19?  . If you are having a medical emergency, call 911. . Seek medical care right away. Before you go to a doctor's office, urgent care or emergency department, call ahead and tell them about your recent travel, contact with someone diagnosed with COVID-19, and your symptoms. You should receive instructions from your physician's office regarding next steps of care.  . When you arrive at healthcare provider, tell the healthcare staff immediately you have returned from visiting [place], [place], [place], [place] or [place]; or traveled in the [place] to [place], [place], [place], or [place]; in the last two weeks or you have been in close contact with a person diagnosed with COVID-19 in the last 2 weeks.   . Tell the health care staff about your symptoms: fever, cough and shortness of breath. . After you have been seen by a medical provider, you will be either: o Tested for (COVID-19) and discharged home on quarantine except to seek medical care if symptoms worsen, and asked to  - Stay home and avoid contact with others until you get your results (4-5 days)  - Avoid travel on public transportation if possible (such as bus, train, or airplane) or o Sent to the Emergency Department by EMS for evaluation, COVID-19 testing, and possible  admission depending on your condition and test results.  What to do if you are LOW RISK for COVID-19?  Reduce your risk of any infection by using the same precautions used for avoiding the common cold or flu:  . Wash your hands often with soap and warm water for at least 20 seconds.  If soap and water are not readily available, use an alcohol-based hand sanitizer with at least 60% alcohol.  . If coughing or sneezing, cover your mouth and nose by coughing or sneezing into the elbow areas of your shirt or coat, into a tissue or into your sleeve (not your hands). . Avoid shaking hands with others and consider head nods or verbal greetings only. . Avoid touching your eyes, nose, or mouth with unwashed hands.  . Avoid close contact with people who are sick. . Avoid places or events with large numbers of people in one location, like concerts or sporting events. . Carefully consider travel plans you have or are making. . If you are planning any travel outside or inside the [place], visit the CDC's Travelers' Health webpage for the latest health notices. . If you have some symptoms but not all symptoms, continue to monitor at home and seek medical attention if your symptoms worsen. . If you are having a medical emergency, call 911.   ADDITIONAL HEALTHCARE OPTIONS FOR PATIENTS  [facility] / e-Visit: [email or web address]         [facility]: [phone]  [facility]: [phone]                   [facility]: [phone]   

## 2020-01-18 DIAGNOSIS — C50411 Malignant neoplasm of upper-outer quadrant of right female breast: Secondary | ICD-10-CM | POA: Diagnosis not present

## 2020-01-19 ENCOUNTER — Encounter: Payer: Self-pay | Admitting: *Deleted

## 2020-01-20 ENCOUNTER — Ambulatory Visit: Payer: BC Managed Care – PPO | Admitting: Radiation Oncology

## 2020-01-20 ENCOUNTER — Ambulatory Visit: Payer: BC Managed Care – PPO

## 2020-01-21 ENCOUNTER — Ambulatory Visit: Payer: BC Managed Care – PPO

## 2020-01-23 NOTE — Progress Notes (Signed)
  Radiation Oncology         [phone] ________________________________  Name: KEYSHLA TUNISON MRN: [id number]  Date: 01/24/2020  DOB: [birth year]  Simulation Verification Note    ICD-10-CM   1. Malignant neoplasm of upper-outer quadrant of right breast in female, estrogen receptor positive ([place])  C50.411    Z17.0     NARRATIVE: The patient was brought to the treatment unit and placed in the planned treatment position. The clinical setup was verified. Then port films were obtained and uploaded to the radiation oncology medical record software.  The treatment beams were carefully compared against the planned radiation fields. The position location and shape of the radiation fields was reviewed. They targeted volume of tissue appears to be appropriately covered by the radiation beams. Organs at risk appear to be excluded as planned.  Based on my personal review, I approved the simulation verification. The patient's treatment will proceed as planned.  -----------------------------------  Blair Promise, PhD, MD  This document serves as a record of services personally performed by Gery Pray, MD. It was created on his behalf by Clerance Lav, a trained medical scribe. The creation of this record is based on the scribe's personal observations and the provider's statements to them. This document has been checked and approved by the attending provider.

## 2020-01-24 ENCOUNTER — Ambulatory Visit: Payer: BC Managed Care – PPO

## 2020-01-24 ENCOUNTER — Ambulatory Visit
Admission: RE | Admit: 2020-01-24 | Discharge: 2020-01-24 | Disposition: A | Discharge status: Home / Self Care | Payer: BC Managed Care – PPO | Source: Ambulatory Visit | Attending: Radiation Oncology | Admitting: Radiation Oncology

## 2020-01-24 ENCOUNTER — Other Ambulatory Visit: Payer: Self-pay

## 2020-01-24 DIAGNOSIS — C50411 Malignant neoplasm of upper-outer quadrant of right female breast: Secondary | ICD-10-CM

## 2020-01-25 ENCOUNTER — Ambulatory Visit
Admission: RE | Admit: 2020-01-25 | Discharge: 2020-01-25 | Disposition: A | Discharge status: Home / Self Care | Payer: BC Managed Care – PPO | Source: Ambulatory Visit | Attending: Radiation Oncology | Admitting: Radiation Oncology

## 2020-01-25 ENCOUNTER — Encounter: Payer: Self-pay | Admitting: Rehabilitation

## 2020-01-25 ENCOUNTER — Other Ambulatory Visit: Payer: Self-pay | Admitting: Internal Medicine

## 2020-01-25 ENCOUNTER — Other Ambulatory Visit: Payer: Self-pay

## 2020-01-25 ENCOUNTER — Ambulatory Visit: Payer: BC Managed Care – PPO | Attending: Surgery | Admitting: Rehabilitation

## 2020-01-25 DIAGNOSIS — M25611 Stiffness of right shoulder, not elsewhere classified: Secondary | ICD-10-CM | POA: Insufficient documentation

## 2020-01-25 DIAGNOSIS — C50411 Malignant neoplasm of upper-outer quadrant of right female breast: Secondary | ICD-10-CM | POA: Diagnosis not present

## 2020-01-25 DIAGNOSIS — Z483 Aftercare following surgery for neoplasm: Secondary | ICD-10-CM | POA: Diagnosis present

## 2020-01-25 DIAGNOSIS — Z9889 Other specified postprocedural states: Secondary | ICD-10-CM | POA: Diagnosis not present

## 2020-01-25 DIAGNOSIS — R600 Localized edema: Secondary | ICD-10-CM | POA: Insufficient documentation

## 2020-01-25 DIAGNOSIS — Z170 Estrogen receptor positive status [ER+]: Secondary | ICD-10-CM

## 2020-01-25 MED ORDER — SONAFINE EX EMUL
1.0000 "application " | Freq: Two times a day (BID) | CUTANEOUS | Status: DC
  Administered 2020-01-25: 1 via TOPICAL

## 2020-01-25 MED ORDER — ALRA NON-METALLIC DEODORANT (RAD-ONC)
1.0000 "application " | Freq: Once | TOPICAL | Status: AC
  Administered 2020-01-25: 1 via TOPICAL

## 2020-01-25 NOTE — Patient Instructions (Addendum)
Self manual lymph drainage: Perform this sequence once a day.  Only give enough pressure no your skin to make the skin move.  Diaphragmatic - Supine   Inhale through nose making navel move out toward hands. Exhale through puckered lips, hands follow navel in.    Hug yourself.  Do circles at your neck just above your collarbones.  Repeat this 10 times.  Axilla - One at a Time   Using full weight of flat hand and fingers at center of the Right armpit, make _10__ in-place circles.   Copyright  VHI. All rights reserved.  LEG: Inguinal Nodes Stimulation   With small finger side of hand against hip crease on Rt side, gently perform circles at the crease. Repeat __10_ times.   Copyright  VHI. All rights reserved.  Axilla to Inguinal Nodes - Sweep   On involved side, sweep _4__ times from armpit along side of trunk to hip crease.       Direct fluid to treat all of lower outer breast tissue downward and outward toward      pathway that is aimed at the left groin.   Finish by doing the pathways as described above going from your involved armpit to the same side groin and going across your upper chest from the involved shoulder to the uninvolved shoulder.  Repeat the steps above where you do circles in your left groin and right armpit. Copyright  VHI. All rights reserved.

## 2020-01-25 NOTE — Therapy (Signed)
[place], [place], [address] Phone: [phone]   Fax:  [phone]  Physical Therapy Evaluation  Patient Details  Name: Alexis Young MRN: [id number] Date of Birth: 10-04-1965 Referring Provider (PT): Dr. Brantley Stage   Encounter Date: 01/25/2020   PT End of Session - 01/25/20 1402    Visit Number 1    Number of Visits 4    Date for PT Re-Evaluation 03/20/20    PT Start Time 1300    PT Stop Time 1353    PT Time Calculation (min) 53 min    Activity Tolerance Patient tolerated treatment well    Behavior During Therapy [facility] for tasks assessed/performed           Past Medical History:  Diagnosis Date  . Arthritis   . Breast cancer ([place]) 11/02/2019   right breast  . Cancer ([place]) 03/2013, 06/2013   malignant melanoma on right arm and back  . Complication of anesthesia    Per patient after hysterectomy in 2016, had to stay over an extra night due to low oxygen levels  . Dyspareunia   . Elevated serum creatinine   . Family history of colon cancer   . Family history of lung cancer   . Family history of melanoma   . Family history of pancreatic cancer   . Family history of stomach cancer   . Family history of uterine cancer   . Fibroid   . GERD (gastroesophageal reflux disease)    takes protonix as needed   . Hypothyroidism   . 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 Tennis elbow 2017   Right  . Tinnitus 2020   both ears    Past Surgical History:  Procedure Laterality Date  . ADENOIDECTOMY     per patient as a child  . BILATERAL SALPINGECTOMY Bilateral 02/14/2015   Procedure: BILATERAL SALPINGECTOMY OOPHORECTOMY;  Surgeon: Nunzio Cobbs, MD;  Location: [facility];  Service: Gynecology;  Laterality: Bilateral;  . BREAST BIOPSY  1987   Dr. Rosana Hoes  . BREAST EXCISIONAL BIOPSY Left    benign  . BREAST LUMPECTOMY WITH RADIOACTIVE SEED AND SENTINEL LYMPH NODE  BIOPSY Right 12/23/2019   Procedure: RIGHT BREAST LUMPECTOMY X 3 WITH RADIOACTIVE SEED AND SENTINEL LYMPH NODE MAPPING;  Surgeon: Erroll Luna, MD;  Location: [facility];  Service: General;  Laterality: Right;  . BREAST SURGERY Right 04/2010   breast biopsy for multiple fibroadenoma - no atypia  . CATARACT EXTRACTION, BILATERAL    . CESAREAN SECTION    . CRYOTHERAPY  5/91   for persistent condyloma atypia  . CYSTOSCOPY N/A 02/14/2015   Procedure: CYSTOSCOPY;  Surgeon: Nunzio Cobbs, MD;  Location: [facility];  Service: Gynecology;  Laterality: N/A;  . LASER ABLATION OF CONDYLOMAS  03/05/90 Dr. 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 MELANOMA EXCISION Right 03/2013   melanoma right arm  . ROBOTIC ASSISTED TOTAL HYSTERECTOMY N/A 02/14/2015   Procedure: ROBOTIC ASSISTED TOTAL HYSTERECTOMY;  Surgeon: Nunzio Cobbs, MD;  Location: [facility];  Service: Gynecology;  Laterality: N/A;  . SKIN CANCER EXCISION  06/2013   midddle of back with path report of atypia  . TUBAL LIGATION Bilateral   . WISDOM TOOTH EXTRACTION      There were no vitals filed for this visit.    Subjective Assessment - 01/25/20 1303    Subjective Pull  with reaching up high but overall good.  I feel swollen.  I had my first radiation treatment yesterday.    Pertinent History Right lumpectomy x 3 with SLNB of 2 nodes removed both negative on 12/23/19 by Dr. Brantley Stage.  ER/PR positive, HER2negative.  [place].  Radiation started yesterday.  History of hysterectomy.    Patient Stated Goals just do what I need to do    Currently in Pain? No/denies              [facility] PT Assessment - 01/25/20 0001      Assessment   Medical Diagnosis Rt breast cancer    Referring Provider (PT) Dr. Brantley Stage    Onset Date/Surgical Date 12/23/19    Hand Dominance Right    Next MD Visit tuesday    Prior Therapy no      Precautions   Precaution Comments lymphedema      Restrictions   Weight Bearing Restrictions No       Balance Screen   Has the patient fallen in the past 6 months No    Has the patient had a decrease in activity level because of a fear of falling?  No    Is the patient reluctant to leave their home because of a fear of falling?  No      Home Ecologist residence      Prior Function   Level of Independence Independent    Vocation Part time employment    Pensions consultant    Leisure walking 4x/week       Cognition   Overall Cognitive Status Within Functional Limits for tasks assessed      Observation/Other Assessments   Observations puckering around lumpectomy incision with enlarged pores and some fibrosis inferior and superior to the incision.  Pt reports this seems to be new since starting radiation.      Skin Integrity Incision still with scabs and glue and appearing not completely healed some redness around nipple from radiation      Sensation   Additional Comments some lateral numbness      Coordination   Gross Motor Movements are Fluid and Coordinated Yes      Posture/Postural Control   Posture/Postural Control Postural limitations    Postural Limitations Rounded Shoulders      ROM / Strength   AROM / PROM / Strength AROM      AROM   AROM Assessment Site Shoulder    Right/Left Shoulder Right;Left    Right Shoulder Flexion 143 Degrees   pull into breast   Right Shoulder ABduction 173 Degrees    Right Shoulder Internal Rotation 65 Degrees    Right Shoulder External Rotation 90 Degrees    Right Shoulder Horizontal ABduction 40 Degrees    Left Shoulder Flexion 156 Degrees    Left Shoulder ABduction 171 Degrees    Left Shoulder Internal Rotation 65 Degrees    Left Shoulder External Rotation 110 Degrees    Left Shoulder Horizontal ABduction 50 Degrees             LYMPHEDEMA/ONCOLOGY QUESTIONNAIRE - 01/25/20 0001      Type   Cancer Type Rt breast      Surgeries   Lumpectomy Date 12/23/19    Number Lymph  Nodes Removed 2      Treatment   Active Chemotherapy Treatment No    Past Chemotherapy Treatment No    Active Radiation Treatment Yes    Past  Radiation Treatment No      Lymphedema Assessments   Lymphedema Assessments Upper extremities      Right Upper Extremity Lymphedema   10 cm Proximal to Olecranon Process 32 cm    Olecranon Process 26 cm    10 cm Proximal to Ulnar Styloid Process 20.5 cm    Just Proximal to Ulnar Styloid Process 15 cm    Across Hand at [employer] 19 cm    At [place] of 2nd Digit 5.8 cm      Left Upper Extremity Lymphedema   10 cm Proximal to Olecranon Process 32 cm    Olecranon Process 27.5 cm    10 cm Proximal to Ulnar Styloid Process 20.2 cm    Just Proximal to Ulnar Styloid Process 15.2 cm    Across Hand at [employer] 19 cm    At [place] of 2nd Digit 5.8 cm                   Objective measurements completed on examination: See above findings.               PT Education - 01/25/20 1401    Education Details lymphedema and risk reduction, SOZO use, initial stretches and MLD for the Rt lateral breast    Person(s) Educated Patient    Methods Explanation;Demonstration;Tactile cues;Verbal cues;Handout    Comprehension Verbalized understanding;Returned demonstration;Verbal cues required;Tactile cues required;Need further instruction               PT Long Term Goals - 01/25/20 1408      PT LONG TERM GOAL #1   Title Pt will improve Rt shoulder flexion to at least 156 degrees to equal the well arm    Baseline 142    Time 12    Period Weeks    Status New      PT LONG TERM GOAL #2   Title pt will be ind with stretch ABC or similar for continued strength and mobility and lymphedema risk reduction    Time 12    Period Weeks    Status New      PT LONG TERM GOAL #3   Title Pt will demonstrate no need for Rt breast MLD or manual therapy post radiation    Time 12    Period Weeks    Status New                   Plan - 01/25/20 1403    Clinical Impression Statement Pt present 4.5 weeks post Rt breast lumpectomy and SLNB on her second day of radiation with slightly decrease Rt shoulder flexion, horizontal abduction, and ER, still healing lumpectomy incision, fibrotic type edema around the lumpectomy incision, and starting already with radiation redness and tenderness on day 2.  Pt was education on self MLD for the Rt breast using Rt axillary and Rt inguinal nodes to perform until too tender to palpation.  Pt was given initial HEP of stretches and was educated on lymphedema and risk reduction.  Due to seeming beginnings of stubborn Rt breast edema pt will follow up after radiation to see if more sessions are needed.    Personal Factors and Comorbidities Comorbidity 2    Comorbidities radiation, SLNB    Examination-Activity Limitations Reach Overhead    Examination-Participation Restrictions Yard Work;Cleaning    Stability/Clinical Decision Making Evolving/Moderate complexity    Clinical Decision Making Low    Rehab Potential Excellent    PT Frequency  Other (comment)   will reassess after radiation   PT Treatment/Interventions ADLs/Self Care Home Management;Patient/family education;Passive range of motion;Manual lymph drainage;Manual techniques;Scar mobilization    PT Next Visit Plan reassess skin, breast edema, ROM, need any more PT sessions or advance HEP?,    PT Home Exercise Plan doorway stretch, wall flexion, scapular retractions, self MLD lateral breast    Recommended Other Services has appt for second to nature after radiation    Consulted and Agree with Plan of Care Patient           The patient will be reassessed on a regular basis (typically every 3 months) to obtain L-Dex scores. If the score is > 6.5 points away from his/her baseline score indicating onset of subclinical lymphedema, it will be recommended to wear a compression garment for 4 weeks, 12 hours per day and then be reassessed. If the  score continues to be > 6.5 points from baseline at reassessment, we will initiate lymphedema treatment. Assessing in this manner has a 95% rate of preventing clinically significant lymphedema.   Patient will benefit from skilled therapeutic intervention in order to improve the following deficits and impairments:  Decreased mobility, Decreased range of motion, Decreased knowledge of precautions, Increased edema  Visit Diagnosis: H/O lumpectomy  Aftercare following surgery for neoplasm  Localized edema  Stiffness of right shoulder, not elsewhere classified     Problem List Patient Active Problem List   Diagnosis Date Noted  . Genetic testing 11/19/2019  . Family history of stomach cancer   . Family history of uterine cancer   . Family history of colon cancer   . Family history of melanoma   . Family history of pancreatic cancer   . Family history of lung cancer   . Malignant neoplasm of upper-outer quadrant of right breast in female, estrogen receptor positive ([place]) 11/04/2019  . Sleep difficulties 06/29/2019  . 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 Tinnitus of right ear 03/12/2019  . Dysfunction of right eustachian tube 06/17/2018  . Hyperlipidemia 01/15/2018  . Prediabetes 01/15/2018  . Palpitations 01/15/2018  . Hypothyroidism 07/31/2016  . Status post laparoscopic hysterectomy 02/14/2015  . Fatigue 05/03/2014  . Premature ovarian failure 09/21/2013    Stark Bray 01/25/2020, 2:10 PM  [place], [place], [address] Phone: [phone]   Fax:  [phone]  Name: Alexis Young MRN: [id number] Date of Birth: 07-Jun-1966

## 2020-01-26 ENCOUNTER — Other Ambulatory Visit: Payer: Self-pay

## 2020-01-26 ENCOUNTER — Ambulatory Visit
Admission: RE | Admit: 2020-01-26 | Discharge: 2020-01-26 | Disposition: A | Discharge status: Home / Self Care | Payer: BC Managed Care – PPO | Source: Ambulatory Visit | Attending: Radiation Oncology | Admitting: Radiation Oncology

## 2020-01-26 DIAGNOSIS — C50411 Malignant neoplasm of upper-outer quadrant of right female breast: Secondary | ICD-10-CM | POA: Diagnosis not present

## 2020-01-27 ENCOUNTER — Other Ambulatory Visit: Payer: Self-pay

## 2020-01-27 ENCOUNTER — Ambulatory Visit
Admission: RE | Admit: 2020-01-27 | Discharge: 2020-01-27 | Disposition: A | Discharge status: Home / Self Care | Payer: BC Managed Care – PPO | Source: Ambulatory Visit | Attending: Radiation Oncology | Admitting: Radiation Oncology

## 2020-01-27 DIAGNOSIS — C50411 Malignant neoplasm of upper-outer quadrant of right female breast: Secondary | ICD-10-CM | POA: Insufficient documentation

## 2020-01-27 DIAGNOSIS — Z510 Encounter for antineoplastic radiation therapy: Secondary | ICD-10-CM | POA: Insufficient documentation

## 2020-01-27 DIAGNOSIS — Z170 Estrogen receptor positive status [ER+]: Secondary | ICD-10-CM | POA: Insufficient documentation

## 2020-01-28 ENCOUNTER — Ambulatory Visit
Admission: RE | Admit: 2020-01-28 | Discharge: 2020-01-28 | Disposition: A | Discharge status: Home / Self Care | Payer: BC Managed Care – PPO | Source: Ambulatory Visit | Attending: Radiation Oncology | Admitting: Radiation Oncology

## 2020-01-28 ENCOUNTER — Other Ambulatory Visit: Payer: Self-pay

## 2020-01-28 DIAGNOSIS — C50411 Malignant neoplasm of upper-outer quadrant of right female breast: Secondary | ICD-10-CM | POA: Diagnosis not present

## 2020-02-01 ENCOUNTER — Other Ambulatory Visit: Payer: Self-pay

## 2020-02-01 ENCOUNTER — Ambulatory Visit
Admission: RE | Admit: 2020-02-01 | Discharge: 2020-02-01 | Disposition: A | Discharge status: Home / Self Care | Payer: BC Managed Care – PPO | Source: Ambulatory Visit | Attending: Radiation Oncology | Admitting: Radiation Oncology

## 2020-02-01 DIAGNOSIS — C50411 Malignant neoplasm of upper-outer quadrant of right female breast: Secondary | ICD-10-CM | POA: Diagnosis not present

## 2020-02-02 ENCOUNTER — Other Ambulatory Visit: Payer: Self-pay

## 2020-02-02 ENCOUNTER — Ambulatory Visit
Admission: RE | Admit: 2020-02-02 | Discharge: 2020-02-02 | Disposition: A | Discharge status: Home / Self Care | Payer: BC Managed Care – PPO | Source: Ambulatory Visit | Attending: Radiation Oncology | Admitting: Radiation Oncology

## 2020-02-02 DIAGNOSIS — C50411 Malignant neoplasm of upper-outer quadrant of right female breast: Secondary | ICD-10-CM | POA: Diagnosis not present

## 2020-02-03 ENCOUNTER — Ambulatory Visit
Admission: RE | Admit: 2020-02-03 | Discharge: 2020-02-03 | Disposition: A | Discharge status: Home / Self Care | Payer: BC Managed Care – PPO | Source: Ambulatory Visit | Attending: Radiation Oncology | Admitting: Radiation Oncology

## 2020-02-03 ENCOUNTER — Other Ambulatory Visit: Payer: Self-pay

## 2020-02-03 DIAGNOSIS — C50411 Malignant neoplasm of upper-outer quadrant of right female breast: Secondary | ICD-10-CM | POA: Diagnosis not present

## 2020-02-04 ENCOUNTER — Ambulatory Visit
Admission: RE | Admit: 2020-02-04 | Discharge: 2020-02-04 | Disposition: A | Discharge status: Home / Self Care | Payer: BC Managed Care – PPO | Source: Ambulatory Visit | Attending: Radiation Oncology | Admitting: Radiation Oncology

## 2020-02-04 ENCOUNTER — Other Ambulatory Visit: Payer: Self-pay

## 2020-02-04 DIAGNOSIS — C50411 Malignant neoplasm of upper-outer quadrant of right female breast: Secondary | ICD-10-CM | POA: Diagnosis not present

## 2020-02-07 ENCOUNTER — Ambulatory Visit
Admission: RE | Admit: 2020-02-07 | Discharge: 2020-02-07 | Disposition: A | Discharge status: Home / Self Care | Payer: BC Managed Care – PPO | Source: Ambulatory Visit | Attending: Radiation Oncology | Admitting: Radiation Oncology

## 2020-02-07 DIAGNOSIS — C50411 Malignant neoplasm of upper-outer quadrant of right female breast: Secondary | ICD-10-CM | POA: Diagnosis not present

## 2020-02-08 ENCOUNTER — Other Ambulatory Visit: Payer: Self-pay

## 2020-02-08 ENCOUNTER — Ambulatory Visit
Admission: RE | Admit: 2020-02-08 | Discharge: 2020-02-08 | Disposition: A | Discharge status: Home / Self Care | Payer: BC Managed Care – PPO | Source: Ambulatory Visit | Attending: Radiation Oncology | Admitting: Radiation Oncology

## 2020-02-08 DIAGNOSIS — C50411 Malignant neoplasm of upper-outer quadrant of right female breast: Secondary | ICD-10-CM | POA: Diagnosis not present

## 2020-02-09 ENCOUNTER — Other Ambulatory Visit: Payer: Self-pay

## 2020-02-09 ENCOUNTER — Ambulatory Visit
Admission: RE | Admit: 2020-02-09 | Discharge: 2020-02-09 | Disposition: A | Discharge status: Home / Self Care | Payer: BC Managed Care – PPO | Source: Ambulatory Visit | Attending: Radiation Oncology | Admitting: Radiation Oncology

## 2020-02-09 DIAGNOSIS — Z170 Estrogen receptor positive status [ER+]: Secondary | ICD-10-CM

## 2020-02-09 DIAGNOSIS — C50411 Malignant neoplasm of upper-outer quadrant of right female breast: Secondary | ICD-10-CM

## 2020-02-09 MED ORDER — SONAFINE EX EMUL
1.0000 "application " | Freq: Two times a day (BID) | CUTANEOUS | Status: DC
  Administered 2020-02-09: 1 via TOPICAL

## 2020-02-10 ENCOUNTER — Ambulatory Visit
Admission: RE | Admit: 2020-02-10 | Discharge: 2020-02-10 | Disposition: A | Discharge status: Home / Self Care | Payer: BC Managed Care – PPO | Source: Ambulatory Visit | Attending: Radiation Oncology | Admitting: Radiation Oncology

## 2020-02-10 ENCOUNTER — Other Ambulatory Visit: Payer: Self-pay

## 2020-02-10 DIAGNOSIS — C50411 Malignant neoplasm of upper-outer quadrant of right female breast: Secondary | ICD-10-CM | POA: Diagnosis not present

## 2020-02-11 ENCOUNTER — Ambulatory Visit: Payer: BC Managed Care – PPO

## 2020-02-11 ENCOUNTER — Other Ambulatory Visit: Payer: Self-pay

## 2020-02-11 ENCOUNTER — Ambulatory Visit
Admission: RE | Admit: 2020-02-11 | Discharge: 2020-02-11 | Disposition: A | Discharge status: Home / Self Care | Payer: BC Managed Care – PPO | Source: Ambulatory Visit | Attending: Radiation Oncology | Admitting: Radiation Oncology

## 2020-02-11 DIAGNOSIS — C50411 Malignant neoplasm of upper-outer quadrant of right female breast: Secondary | ICD-10-CM | POA: Diagnosis not present

## 2020-02-14 ENCOUNTER — Ambulatory Visit
Admission: RE | Admit: 2020-02-14 | Discharge: 2020-02-14 | Disposition: A | Discharge status: Home / Self Care | Payer: BC Managed Care – PPO | Source: Ambulatory Visit | Attending: Radiation Oncology | Admitting: Radiation Oncology

## 2020-02-14 ENCOUNTER — Ambulatory Visit: Payer: BC Managed Care – PPO

## 2020-02-14 ENCOUNTER — Other Ambulatory Visit: Payer: Self-pay

## 2020-02-14 DIAGNOSIS — C50411 Malignant neoplasm of upper-outer quadrant of right female breast: Secondary | ICD-10-CM | POA: Diagnosis not present

## 2020-02-15 ENCOUNTER — Ambulatory Visit
Admission: RE | Admit: 2020-02-15 | Discharge: 2020-02-15 | Disposition: A | Discharge status: Home / Self Care | Payer: BC Managed Care – PPO | Source: Ambulatory Visit | Attending: Radiation Oncology | Admitting: Radiation Oncology

## 2020-02-15 ENCOUNTER — Ambulatory Visit: Payer: BC Managed Care – PPO

## 2020-02-15 ENCOUNTER — Other Ambulatory Visit: Payer: Self-pay

## 2020-02-15 DIAGNOSIS — C50411 Malignant neoplasm of upper-outer quadrant of right female breast: Secondary | ICD-10-CM | POA: Diagnosis not present

## 2020-02-16 ENCOUNTER — Ambulatory Visit
Admission: RE | Admit: 2020-02-16 | Discharge: 2020-02-16 | Disposition: A | Discharge status: Home / Self Care | Payer: BC Managed Care – PPO | Source: Ambulatory Visit | Attending: Radiation Oncology | Admitting: Radiation Oncology

## 2020-02-16 ENCOUNTER — Ambulatory Visit: Payer: BC Managed Care – PPO

## 2020-02-16 ENCOUNTER — Other Ambulatory Visit: Payer: Self-pay

## 2020-02-16 DIAGNOSIS — C50411 Malignant neoplasm of upper-outer quadrant of right female breast: Secondary | ICD-10-CM | POA: Diagnosis not present

## 2020-02-17 ENCOUNTER — Other Ambulatory Visit: Payer: Self-pay

## 2020-02-17 ENCOUNTER — Ambulatory Visit: Payer: BC Managed Care – PPO

## 2020-02-17 ENCOUNTER — Ambulatory Visit
Admission: RE | Admit: 2020-02-17 | Discharge: 2020-02-17 | Disposition: A | Discharge status: Home / Self Care | Payer: BC Managed Care – PPO | Source: Ambulatory Visit | Attending: Radiation Oncology | Admitting: Radiation Oncology

## 2020-02-17 DIAGNOSIS — C50411 Malignant neoplasm of upper-outer quadrant of right female breast: Secondary | ICD-10-CM | POA: Diagnosis not present

## 2020-02-18 ENCOUNTER — Ambulatory Visit
Admission: RE | Admit: 2020-02-18 | Discharge: 2020-02-18 | Disposition: A | Discharge status: Home / Self Care | Payer: BC Managed Care – PPO | Source: Ambulatory Visit | Attending: Radiation Oncology | Admitting: Radiation Oncology

## 2020-02-18 ENCOUNTER — Encounter: Payer: Self-pay | Admitting: *Deleted

## 2020-02-18 DIAGNOSIS — C50411 Malignant neoplasm of upper-outer quadrant of right female breast: Secondary | ICD-10-CM | POA: Diagnosis not present

## 2020-02-21 ENCOUNTER — Ambulatory Visit
Admission: RE | Admit: 2020-02-21 | Discharge: 2020-02-21 | Disposition: A | Discharge status: Home / Self Care | Payer: BC Managed Care – PPO | Source: Ambulatory Visit | Attending: Radiation Oncology | Admitting: Radiation Oncology

## 2020-02-21 ENCOUNTER — Encounter: Payer: Self-pay | Admitting: Radiation Oncology

## 2020-02-21 ENCOUNTER — Other Ambulatory Visit: Payer: Self-pay

## 2020-02-21 DIAGNOSIS — C50411 Malignant neoplasm of upper-outer quadrant of right female breast: Secondary | ICD-10-CM | POA: Diagnosis not present

## 2020-03-06 NOTE — Progress Notes (Incomplete)
  Patient Name: Alexis Young MRN: [id number] DOB: 1966/05/31 Referring Physician: Erroll Luna (Profile Not Attached) Date of Service: 02/21/2020 [facility], [place]                                                        End Of Treatment Note  Diagnoses: C50.411-Malignant neoplasm of upper-outer quadrant of right female breast  Cancer Staging: StageIA (pT1a, pN0) RightBreast UOQ,Invasive LobularCarcinoma with LCIS, ER+/ PR+/ Her2-, Grade2  Intent: Curative  Radiation Treatment Dates: 01/24/2020 through 02/21/2020 Site Technique Total Dose (Gy) Dose per Fx (Gy) Completed Fx Beam Energies  Breast, Right: Breast_Rt 3D 40.05/40.05 2.67 15/15 6X, 10X  Breast, Right: Breast_Rt_Bst 3D 10/10 2 5/5 6X, 10X   Narrative: The patient tolerated radiation therapy relatively well. She did report an intermittent, sharp, shooting pain in the right breast in addition to mild fatigue, occasional nausea, tenderness under right breast, and itching/burning of the right breast. During the second week of treatment, the right breast area was noted to be slightly pink. This developed into diffuse erythema without any skin breakdown or moist desqumation.   Plan: The patient will follow-up with radiation oncology in one month.  ________________________________________________   Blair Promise, PhD, MD  This document serves as a record of services personally performed by Gery Pray, MD. It was created on his behalf by Clerance Lav, a trained medical scribe. The creation of this record is based on the scribe's personal observations and the provider's statements to them. This document has been checked and approved by the attending provider.

## 2020-03-08 ENCOUNTER — Encounter: Payer: Self-pay | Admitting: Internal Medicine

## 2020-03-09 ENCOUNTER — Telehealth: Payer: Self-pay | Admitting: *Deleted

## 2020-03-09 ENCOUNTER — Other Ambulatory Visit: Payer: Self-pay | Admitting: *Deleted

## 2020-03-09 DIAGNOSIS — Z170 Estrogen receptor positive status [ER+]: Secondary | ICD-10-CM

## 2020-03-09 DIAGNOSIS — C50411 Malignant neoplasm of upper-outer quadrant of right female breast: Secondary | ICD-10-CM

## 2020-03-09 DIAGNOSIS — N6320 Unspecified lump in the left breast, unspecified quadrant: Secondary | ICD-10-CM

## 2020-03-09 NOTE — Telephone Encounter (Signed)
Received call from patient stating she has felt a small lump in her left breast near her nipple that she had not noticed before.  She states she did have a biopsy initially from a finding on MRI in the central left breast that was [facility].  Explained that it would be a good idea to have a mammo/us of this new area just to be sure. She verbalized understanding. Orders placed and message sent to [facility] for an appointment.

## 2020-03-16 ENCOUNTER — Encounter: Payer: Self-pay | Admitting: Rehabilitation

## 2020-03-16 ENCOUNTER — Other Ambulatory Visit: Payer: Self-pay

## 2020-03-16 ENCOUNTER — Ambulatory Visit: Payer: BC Managed Care – PPO | Attending: Surgery | Admitting: Rehabilitation

## 2020-03-16 DIAGNOSIS — Z9889 Other specified postprocedural states: Secondary | ICD-10-CM | POA: Insufficient documentation

## 2020-03-16 DIAGNOSIS — R600 Localized edema: Secondary | ICD-10-CM | POA: Insufficient documentation

## 2020-03-16 DIAGNOSIS — Z483 Aftercare following surgery for neoplasm: Secondary | ICD-10-CM | POA: Diagnosis present

## 2020-03-16 NOTE — Therapy (Signed)
[place], [place], [address] Phone: [phone]   Fax:  [phone]  Physical Therapy Treatment  Patient Details  Name: Alexis Young MRN: [id number] Date of Birth: Aug 09, 1965 Referring Provider (PT): Dr. Brantley Stage   Encounter Date: 03/16/2020   PT End of Session - 03/16/20 0956    Visit Number 2    Number of Visits 10    Date for PT Re-Evaluation 05/11/20    PT Start Time 0856    PT Stop Time 0953    PT Time Calculation (min) 57 min    Activity Tolerance Patient tolerated treatment well    Behavior During Therapy [facility] for tasks assessed/performed           Past Medical History:  Diagnosis Date  . Arthritis   . Breast cancer ([place]) 11/02/2019   right breast  . Cancer ([place]) 03/2013, 06/2013   malignant melanoma on right arm and back  . Complication of anesthesia    Per patient after hysterectomy in 2016, had to stay over an extra night due to low oxygen levels  . Dyspareunia   . Elevated serum creatinine   . Family history of colon cancer   . Family history of lung cancer   . Family history of melanoma   . Family history of pancreatic cancer   . Family history of stomach cancer   . Family history of uterine cancer   . Fibroid   . GERD (gastroesophageal reflux disease)    takes protonix as needed   . Hypothyroidism   . 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 Tennis elbow 2017   Right  . Tinnitus 2020   both ears    Past Surgical History:  Procedure Laterality Date  . ADENOIDECTOMY     per patient as a child  . BILATERAL SALPINGECTOMY Bilateral 02/14/2015   Procedure: BILATERAL SALPINGECTOMY OOPHORECTOMY;  Surgeon: Nunzio Cobbs, MD;  Location: [facility];  Service: Gynecology;  Laterality: Bilateral;  . BREAST BIOPSY  1987   Dr. Rosana Hoes  . BREAST EXCISIONAL BIOPSY Left    benign  . BREAST LUMPECTOMY WITH RADIOACTIVE SEED AND SENTINEL LYMPH NODE  BIOPSY Right 12/23/2019   Procedure: RIGHT BREAST LUMPECTOMY X 3 WITH RADIOACTIVE SEED AND SENTINEL LYMPH NODE MAPPING;  Surgeon: Erroll Luna, MD;  Location: [facility];  Service: General;  Laterality: Right;  . BREAST SURGERY Right 04/2010   breast biopsy for multiple fibroadenoma - no atypia  . CATARACT EXTRACTION, BILATERAL    . CESAREAN SECTION    . CRYOTHERAPY  5/91   for persistent condyloma atypia  . CYSTOSCOPY N/A 02/14/2015   Procedure: CYSTOSCOPY;  Surgeon: Nunzio Cobbs, MD;  Location: [facility];  Service: Gynecology;  Laterality: N/A;  . LASER ABLATION OF CONDYLOMAS  03/05/90 Dr. 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 MELANOMA EXCISION Right 03/2013   melanoma right arm  . ROBOTIC ASSISTED TOTAL HYSTERECTOMY N/A 02/14/2015   Procedure: ROBOTIC ASSISTED TOTAL HYSTERECTOMY;  Surgeon: Nunzio Cobbs, MD;  Location: [facility];  Service: Gynecology;  Laterality: N/A;  . SKIN CANCER EXCISION  06/2013   midddle of back with path report of atypia  . TUBAL LIGATION Bilateral   . WISDOM TOOTH EXTRACTION      There were no vitals filed for this visit.   Subjective Assessment - 03/16/20 0856    Subjective I tolerated  radiation well.  02/21/20 was my last radiation. I feel no limitations. I feel pretty normal    Pertinent History Right lumpectomy x 3 with SLNB of 2 nodes removed both negative on 12/23/19 by Dr. Brantley Stage.  ER/PR positive, HER2negative.  [place].  Radiation started yesterday.  History of hysterectomy.    Currently in Pain? No/denies              [facility] PT Assessment - 03/16/20 0001      Observation/Other Assessments   Observations puckering around lumpectomy incision with enlarged pores and some fibrosis inferior and superior to the incision.  Darkened skin from radiation      ROM / Strength   AROM / PROM / Strength Strength      AROM   Right Shoulder Flexion 157 Degrees    Right Shoulder Horizontal ABduction 43 Degrees      Strength   Overall  Strength Comments shoulders: 5/5 bil all motions      Palpation   Palpation comment fibrosis inferior Rt breast near lumpectomy incision slightly worse post radiation                         [facility] Adult PT Treatment/Exercise - 03/16/20 0001      Self-Care   Self-Care Other Self-Care Comments    Other Self-Care Comments  Education on including the breast in her self MLD work which she was doing throughout radiation.  Demonstration and cueing for treating the inferior/lateral breast.  Pt scheduled for compression bra 03/31/20.        Manual Therapy   Manual Therapy Manual Lymphatic Drainage (MLD);Edema management    Edema Management Made pt large circular chip pack and showed pt jovipak and pt will think about this for measuring on 03/31/20    Manual Lymphatic Drainage (MLD) education on self MLD of the Rt breast per instruction section omitting anterior interaxillary work due to [employer] of fibrosis                  PT Education - 03/16/20 0956    Education Details self MLD now including the breast    Person(s) Educated Patient    Methods Explanation;Demonstration;Tactile cues;Verbal cues;Handout    Comprehension Verbalized understanding;Returned demonstration;Verbal cues required;Tactile cues required;Need further instruction               PT Long Term Goals - 03/16/20 1000      PT LONG TERM GOAL #1   Title Pt will improve Rt shoulder flexion to at least 156 degrees to equal the well arm    Status Achieved      PT LONG TERM GOAL #2   Title pt will be ind with stretch ABC or similar for continued strength and mobility and lymphedema risk reduction    Status On-going      PT LONG TERM GOAL #3   Title Pt will report decrease in Rt breast fibrosis and heaviness by at least 50%    Time 8    Period Weeks    Status Revised                 Plan - 03/16/20 0957    Clinical Impression Statement Pt returns now post radiation with improvements in Rt UE  ROM now without pulling, excellent strength, no mobility limitations, but worsening of Rt breast firmness/fibrosis/edema that was present prior to radiation.  Pt has been performing self MLD throughout radiation due to distance from  clinic but would benefit from more formal visits now that skin is healed. Pt was educated on bras, chip packs, and how to add breast to the steps today and due to distance from clinic will return to be measured and have visit 1 of MLD in around 2 weeks.    PT Frequency 1x / week    PT Duration 8 weeks    PT Treatment/Interventions ADLs/Self Care Home Management;Patient/family education;Passive range of motion;Manual lymph drainage;Manual techniques;Scar mobilization    PT Next Visit Plan any questions on self MLD Rt breast, how was chip pack, start Rt breast MLD (self MLD has no anterior interaxillary but feel free to do this in treatment and add if needed)    PT Home Exercise Plan doorway stretch, wall flexion, scapular retractions, self MLD lateral breast    Recommended Other Services has appt for bra 03/31/20, we will provide Rx    Consulted and Agree with Plan of Care Patient           Patient will benefit from skilled therapeutic intervention in order to improve the following deficits and impairments:     Visit Diagnosis: Aftercare following surgery for neoplasm  H/O lumpectomy  Localized edema     Problem List Patient Active Problem List   Diagnosis Date Noted  . Genetic testing 11/19/2019  . Family history of stomach cancer   . Family history of uterine cancer   . Family history of colon cancer   . Family history of melanoma   . Family history of pancreatic cancer   . Family history of lung cancer   . Malignant neoplasm of upper-outer quadrant of right breast in female, estrogen receptor positive ([place]) 11/04/2019  . Sleep difficulties 06/29/2019  . 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  Tinnitus of right ear 03/12/2019  . Dysfunction of right eustachian tube 06/17/2018  . Hyperlipidemia 01/15/2018  . Prediabetes 01/15/2018  . Palpitations 01/15/2018  . Hypothyroidism 07/31/2016  . Status post laparoscopic hysterectomy 02/14/2015  . Fatigue 05/03/2014  . Premature ovarian failure 09/21/2013    Stark Bray 03/16/2020, 10:02 AM  [place], [place], [address] Phone: [phone]   Fax:  [phone]  Name: Alexis Young MRN: [id number] Date of Birth: 1965/09/08

## 2020-03-16 NOTE — Patient Instructions (Signed)

## 2020-03-22 NOTE — Progress Notes (Signed)
Radiation Oncology         [phone] ________________________________  Name: Alexis Young MRN: [id number]  Date: 03/23/2020  DOB: 06/16/1966  Follow-Up Visit Note  CC: Binnie Rail, MD  Erroll Luna, MD    ICD-10-CM   1. Malignant neoplasm of upper-outer quadrant of right breast in female, estrogen receptor positive ([place])  C50.411    Z17.0     Diagnosis: StageIA (pT1a, pN0)RightBreast UOQ,Invasive LobularCarcinomawith LCIS, ER+/ PR+/ Her2-, Grade2  Interval Since Last Radiation: One month  Radiation Treatment Dates: 01/24/2020 through 02/21/2020 Site Technique Total Dose (Gy) Dose per Fx (Gy) Completed Fx Beam Energies  Breast, Right: Breast_Rt 3D 40.05/40.05 2.67 15/15 6X, 10X  Breast, Right: Breast_Rt_Bst 3D 10/10 2 5/5 6X, 10X    Narrative:  The patient returns today for routine follow-up.   On review of systems, she reports a small palpable lump in her left breast. A mammogram and ultrasound were done earlier today. Results are pending. Biopsy is scheduled for 04/05/2020. She also reports occasional pulling sensation in her right shoulder when taking deep breaths. She denies nipple discharge or bleeding.  She denies any pain along the right breast or problems with swelling of the right arm.  She has been set up for physical therapy evaluation.           ALLERGIES:  is allergic to tetracyclines & related, asa [aspirin], and sulfa antibiotics.  Meds: Current Outpatient Medications  Medication Sig Dispense Refill  . gabapentin (NEURONTIN) 300 MG capsule Take 1 capsule (300 mg total) by mouth at bedtime. 90 capsule 4  . levothyroxine (SYNTHROID) 50 MCG tablet TAKE 1 TABLET BY MOUTH EVERY DAY 90 tablet 1  . Polyethyl Glycol-Propyl Glycol (SYSTANE OP) Place 1 drop into both eyes daily as needed (dry eyes). (Patient not taking: Reported on 03/23/2020)    . tretinoin (RETIN-A) 0.025 % cream APPLY A PEA SIZE AMOUNT TO AFFECTED AREAS NIGHTLY. (Patient not taking:  Reported on 03/23/2020)     No current facility-administered medications for this encounter.    Physical Findings: The patient is in no acute distress. Patient is alert and oriented.  height is $RemoveB'5\' 6"'cDyrrSuJ$  (1.676 m) and weight is 182 lb (82.6 kg). Her oral temperature is 97.9 F (36.6 C). Her blood pressure is 149/79 (abnormal) and her pulse is 60. Her respiration is 18 and oxygen saturation is 100%.  No significant changes. Lungs are clear to auscultation bilaterally. Heart has regular rate and rhythm. No palpable cervical, supraclavicular, or axillary adenopathy. Abdomen soft, non-tender, normal bowel sounds. Left breast: No nipple discharge or bleeding.  Patient has a palpable pea-sized nodule along the lateral aspect of the areolar border.  No nipple discharge or bleeding Right breast: Patient skin is healed well.  She has some mild hyperpigmentation changes in mild to moderate edema in the breast area.  No dominant mass appreciated nipple discharge or bleeding  Lab Findings: Lab Results  Component Value Date   WBC 5.7 01/10/2020   HGB 12.5 01/10/2020   HCT 38.9 01/10/2020   MCV 87.4 01/10/2020   PLT 207 01/10/2020    Radiographic Findings: US BREAST LTD UNI LEFT INC AXILLA  Result Date: 03/23/2020 CLINICAL DATA:  54 year old female presenting for evaluation of a palpable lump in the left breast. She found this about 2 months ago. She has history of right breast lumpectomy for invasive lobular carcinoma in May of 2021. EXAM: DIGITAL DIAGNOSTIC UNILATERAL LEFT MAMMOGRAM WITH TOMO AND CAD; ULTRASOUND LEFT BREAST LIMITED  COMPARISON:  Previous exam(s). ACR Breast Density Category c: The breast tissue is heterogeneously dense, which may obscure small masses. FINDINGS: A BB indicating the palpable site of concern has been placed on the lateral periareolar left breast. No suspicious mammographic findings are seen deep to this marker. No suspicious calcifications, masses or areas of distortion are  seen in the left breast. Mammographic images were processed with CAD. Ultrasound targeted to the left breast at 3 o'clock in the subareolar location demonstrates a heterogeneous hypoechoic oval complex mass with indistinct margins measuring 1.1 x 0.6 x 0.9 cm. No blood flow is seen within the mass on color Doppler imaging. There are 2 adjacent benign appearing cysts in the retroareolar left breast at 3 o'clock. Ultrasound of the left axilla demonstrates multiple normal-appearing lymph nodes. IMPRESSION: 1. There is an indeterminate 1.1 cm complex mass in the retroareolar left breast at 3 o'clock. 2.  No evidence of left axillary lymphadenopathy. RECOMMENDATION: Ultrasound guided biopsy is recommended for the left breast mass, and has been scheduled for 04/05/2020 at 8:30 a.m. I have discussed the findings and recommendations with the patient. If applicable, a reminder letter will be sent to the patient regarding the next appointment. BI-RADS CATEGORY  4: Suspicious. Electronically Signed   By: Ammie Ferrier M.D.   On: 03/23/2020 11:13   MM DIAG BREAST TOMO UNI LEFT  Result Date: 03/23/2020 CLINICAL DATA:  54 year old female presenting for evaluation of a palpable lump in the left breast. She found this about 2 months ago. She has history of right breast lumpectomy for invasive lobular carcinoma in May of 2021. EXAM: DIGITAL DIAGNOSTIC UNILATERAL LEFT MAMMOGRAM WITH TOMO AND CAD; ULTRASOUND LEFT BREAST LIMITED COMPARISON:  Previous exam(s). ACR Breast Density Category c: The breast tissue is heterogeneously dense, which may obscure small masses. FINDINGS: A BB indicating the palpable site of concern has been placed on the lateral periareolar left breast. No suspicious mammographic findings are seen deep to this marker. No suspicious calcifications, masses or areas of distortion are seen in the left breast. Mammographic images were processed with CAD. Ultrasound targeted to the left breast at 3 o'clock in the  subareolar location demonstrates a heterogeneous hypoechoic oval complex mass with indistinct margins measuring 1.1 x 0.6 x 0.9 cm. No blood flow is seen within the mass on color Doppler imaging. There are 2 adjacent benign appearing cysts in the retroareolar left breast at 3 o'clock. Ultrasound of the left axilla demonstrates multiple normal-appearing lymph nodes. IMPRESSION: 1. There is an indeterminate 1.1 cm complex mass in the retroareolar left breast at 3 o'clock. 2.  No evidence of left axillary lymphadenopathy. RECOMMENDATION: Ultrasound guided biopsy is recommended for the left breast mass, and has been scheduled for 04/05/2020 at 8:30 a.m. I have discussed the findings and recommendations with the patient. If applicable, a reminder letter will be sent to the patient regarding the next appointment. BI-RADS CATEGORY  4: Suspicious. Electronically Signed   By: Ammie Ferrier M.D.   On: 03/23/2020 11:13    Impression: StageIA (pT1a, pN0)RightBreast UOQ,Invasive LobularCarcinomawith LCIS, ER+/ PR+/ Her2-, Grade2  The patient is recovering from the effects of radiation.  Overall she tolerated her radiation therapy well.  Patient palpated a new nodule in the left breast undergoing evaluation for this issue.  Report as above  Plan:  Biopsy is scheduled for 04/05/2020 of the indeterminate 1.1 cm complex indeterminate mass in the left retroareolar area. she is scheduled to follow-up with Dr. Jana Hakim on 03/28/2020. She will  follow-up with radiation oncology in 3 months.    ____________________________________   Blair Promise, PhD, MD  This document serves as a record of services personally performed by Gery Pray, MD. It was created on his behalf by Clerance Lav, a trained medical scribe. The creation of this record is based on the scribe's personal observations and the provider's statements to them. This document has been checked and approved by the attending provider.

## 2020-03-23 ENCOUNTER — Ambulatory Visit
Admission: RE | Admit: 2020-03-23 | Discharge: 2020-03-23 | Disposition: A | Discharge status: Home / Self Care | Payer: BC Managed Care – PPO | Source: Ambulatory Visit | Attending: Oncology | Admitting: Oncology

## 2020-03-23 ENCOUNTER — Encounter: Payer: Self-pay | Admitting: Radiation Oncology

## 2020-03-23 ENCOUNTER — Other Ambulatory Visit: Payer: Self-pay | Admitting: Oncology

## 2020-03-23 ENCOUNTER — Other Ambulatory Visit: Payer: Self-pay

## 2020-03-23 ENCOUNTER — Ambulatory Visit
Admission: RE | Admit: 2020-03-23 | Discharge: 2020-03-23 | Disposition: A | Discharge status: Home / Self Care | Payer: BC Managed Care – PPO | Source: Ambulatory Visit | Attending: Radiation Oncology | Admitting: Radiation Oncology

## 2020-03-23 DIAGNOSIS — Z79899 Other long term (current) drug therapy: Secondary | ICD-10-CM | POA: Insufficient documentation

## 2020-03-23 DIAGNOSIS — C50411 Malignant neoplasm of upper-outer quadrant of right female breast: Secondary | ICD-10-CM

## 2020-03-23 DIAGNOSIS — N6320 Unspecified lump in the left breast, unspecified quadrant: Secondary | ICD-10-CM

## 2020-03-23 DIAGNOSIS — Z170 Estrogen receptor positive status [ER+]: Secondary | ICD-10-CM | POA: Insufficient documentation

## 2020-03-23 DIAGNOSIS — Z923 Personal history of irradiation: Secondary | ICD-10-CM | POA: Diagnosis not present

## 2020-03-23 NOTE — Progress Notes (Signed)
Patient here for a 1 month f/u visit with Dr. Sondra Come. Patient reports having an u/s and mammogram on her left breast today.  She is to have a breast bx on 9/8. Has occasional pulling in her right shoulder  with a deep breath. Denies any other problems with her right breast.  BP (!) 149/79 (BP Location: Left Arm, Patient Position: Sitting)   Pulse 60   Temp 97.9 F (36.6 C) (Oral)   Resp 18   Ht 5\' 6"  (1.676 m)   Wt 182 lb (82.6 kg)   LMP 10/15/2014 (Exact Date)   SpO2 100%   BMI 29.38 kg/m   Wt Readings from Last 3 Encounters:  03/23/20 182 lb (82.6 kg)  01/13/20 188 lb (85.3 kg)  01/10/20 189 lb 9.6 oz (86 kg)

## 2020-03-28 ENCOUNTER — Inpatient Hospital Stay: Payer: BC Managed Care – PPO | Attending: Oncology | Admitting: Oncology

## 2020-03-28 ENCOUNTER — Other Ambulatory Visit: Payer: Self-pay

## 2020-03-28 ENCOUNTER — Inpatient Hospital Stay: Payer: BC Managed Care – PPO

## 2020-03-28 VITALS — BP 143/69 | HR 65 | Temp 97.6°F | Resp 18 | Ht 66.0 in | Wt 180.8 lb

## 2020-03-28 DIAGNOSIS — M129 Arthropathy, unspecified: Secondary | ICD-10-CM | POA: Diagnosis not present

## 2020-03-28 DIAGNOSIS — K219 Gastro-esophageal reflux disease without esophagitis: Secondary | ICD-10-CM | POA: Insufficient documentation

## 2020-03-28 DIAGNOSIS — Z800 Family history of malignant neoplasm of digestive organs: Secondary | ICD-10-CM | POA: Diagnosis not present

## 2020-03-28 DIAGNOSIS — Z801 Family history of malignant neoplasm of trachea, bronchus and lung: Secondary | ICD-10-CM | POA: Diagnosis not present

## 2020-03-28 DIAGNOSIS — Z85820 Personal history of malignant melanoma of skin: Secondary | ICD-10-CM | POA: Insufficient documentation

## 2020-03-28 DIAGNOSIS — E039 Hypothyroidism, unspecified: Secondary | ICD-10-CM | POA: Diagnosis not present

## 2020-03-28 DIAGNOSIS — R944 Abnormal results of kidney function studies: Secondary | ICD-10-CM | POA: Insufficient documentation

## 2020-03-28 DIAGNOSIS — Z170 Estrogen receptor positive status [ER+]: Secondary | ICD-10-CM

## 2020-03-28 DIAGNOSIS — Z79810 Long term (current) use of selective estrogen receptor modulators (SERMs): Secondary | ICD-10-CM | POA: Diagnosis not present

## 2020-03-28 DIAGNOSIS — Z79899 Other long term (current) drug therapy: Secondary | ICD-10-CM | POA: Diagnosis not present

## 2020-03-28 DIAGNOSIS — C50411 Malignant neoplasm of upper-outer quadrant of right female breast: Secondary | ICD-10-CM | POA: Insufficient documentation

## 2020-03-28 LAB — CBC WITH DIFFERENTIAL/PLATELET
Abs Immature Granulocytes: 0.01 10*3/uL (ref 0.00–0.07)
Basophils Absolute: 0 10*3/uL (ref 0.0–0.1)
Basophils Relative: 1 %
Eosinophils Absolute: 0.1 10*3/uL (ref 0.0–0.5)
Eosinophils Relative: 2 %
HCT: 39.9 % (ref 36.0–46.0)
Hemoglobin: 13 g/dL (ref 12.0–15.0)
Immature Granulocytes: 0 %
Lymphocytes Relative: 29 %
Lymphs Abs: 1.1 10*3/uL (ref 0.7–4.0)
MCH: 28 pg (ref 26.0–34.0)
MCHC: 32.6 g/dL (ref 30.0–36.0)
MCV: 85.8 fL (ref 80.0–100.0)
Monocytes Absolute: 0.3 10*3/uL (ref 0.1–1.0)
Monocytes Relative: 8 %
Neutro Abs: 2.2 10*3/uL (ref 1.7–7.7)
Neutrophils Relative %: 60 %
Platelets: 208 10*3/uL (ref 150–400)
RBC: 4.65 MIL/uL (ref 3.87–5.11)
RDW: 12.6 % (ref 11.5–15.5)
WBC: 3.6 10*3/uL — ABNORMAL LOW (ref 4.0–10.5)
nRBC: 0 % (ref 0.0–0.2)

## 2020-03-28 LAB — COMPREHENSIVE METABOLIC PANEL
ALT: 20 U/L (ref 0–44)
AST: 25 U/L (ref 15–41)
Albumin: 4.2 g/dL (ref 3.5–5.0)
Alkaline Phosphatase: 89 U/L (ref 38–126)
Anion gap: 8 (ref 5–15)
BUN: 10 mg/dL (ref 6–20)
CO2: 25 mmol/L (ref 22–32)
Calcium: 10.1 mg/dL (ref 8.9–10.3)
Chloride: 108 mmol/L (ref 98–111)
Creatinine, Ser: 0.93 mg/dL (ref 0.44–1.00)
GFR calc Af Amer: >60 mL/min (ref >60)
GFR calc non Af Amer: >60 mL/min (ref >60)
Glucose, Bld: 90 mg/dL (ref 70–99)
Potassium: 3.8 mmol/L (ref 3.5–5.1)
Sodium: 141 mmol/L (ref 135–145)
Total Bilirubin: 0.7 mg/dL (ref 0.3–1.2)
Total Protein: 7.6 g/dL (ref 6.5–8.1)

## 2020-03-28 MED ORDER — TAMOXIFEN CITRATE 20 MG PO TABS: 20.0000 mg | ORAL_TABLET | Freq: Every day | ORAL | 4 refills | Status: AC

## 2020-03-28 NOTE — Progress Notes (Signed)
[place] [place]  [phone] [phone]     ID: Alexis Young DOB: Dec 24, 1965  MR#: [id number]  OZD#:[id number]  Patient Care Team: Binnie Rail, MD as PCP - General (Internal Medicine) Mauro Kaufmann, RN as Oncology Nurse Navigator Rockwell Germany, RN as Oncology Nurse Navigator Grason Brailsford, Virgie Dad, MD as Consulting Physician (Oncology) Gery Pray, MD as Consulting Physician (Radiation Oncology) Erroll Luna, MD as Consulting Physician (General Surgery) Yisroel Ramming, Everardo All, MD as Consulting Physician (Obstetrics and Gynecology) Garrel Ridgel, [place] as Consulting Physician (Podiatry) Harriett Sine, MD as Consulting Physician (Dermatology) Juanita Craver, MD as Consulting Physician (Gastroenterology) Chauncey Cruel, MD OTHER MD:  CHIEF COMPLAINT: Estrogen receptor positive breast cancer  CURRENT TREATMENT: to start tamoxifen   INTERVAL HISTORY: Alexis Young returns today for follow up of her estrogen receptor positive breast cancer accompanied by her husband Richardson Landry.   Since her last visit, she was referred back to Dr. Sondra Come on 01/13/2020 to discuss radiation therapy. She received treatment from 01/24/2020 through 02/21/2020.  She reached out to our office on 03/09/2020 to report a small lump in her left breast near her nipple that she had not noticed before. Recall she did undergo biopsy on 12/02/2019 of a finding on MRI showing fibroadenoma. She underwent left breast mammography and ultrasonography on 03/23/2020 at [facility] showing: breast density category C; indeterminate 1.1 cm complex mass in retroareolar left breast at 3 o'clock; no evidence of left axillary lymphadenopathy.  She is scheduled for biopsy of the area on 04/05/2020.   REVIEW OF SYSTEMS: Alexis Young did well with her radiation treatments, and is picking up energy.  She is doing some walking, at least 2 miles a day.  She finds the nighttime gabapentin is helping her sleep a  little bit better.  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  She has not received the Covid vaccine yet but is considering it.  A detailed review of systems today was otherwise noncontributory   HISTORY OF CURRENT ILLNESS: From the original intake note:  Alexis Young had routine screening mammography on 10/14/2019 showing a possible abnormality in the bilateral breasts. She underwent bilateral diagnostic mammography with tomography and bilateral breast ultrasonography at [facility] on 10/27/2019 showing: breast density category C; small area of asymmetry with pronounced focal distortion in outer right breast with no ultrasound correlate; benign left breast cysts; no abnormal-appearing lymph nodes.  Accordingly on 11/01/2019 she proceeded to biopsy of the right breast area in question. The pathology from this procedure ([id number]) showed: invasive lobular carcinoma, grade 1, e-cadherin negative. Prognostic indicators significant for: estrogen receptor, 80% positive and progesterone receptor, 80% positive, both with strong staining intensity. Proliferation marker Ki67 at 2%. HER2 negative by immunohistochemistry (0).  The patient's subsequent history is as detailed below.   PAST MEDICAL HISTORY: Past Medical History:  Diagnosis Date  . Arthritis   . Breast cancer ([place]) 11/02/2019   right breast  . Cancer ([place]) 03/2013, 06/2013   malignant melanoma on right arm and back  . Complication of anesthesia    Per patient after hysterectomy in 2016, had to stay over an extra night due to low oxygen levels  . Dyspareunia   . Elevated serum creatinine   . Family history of colon cancer   . Family history of lung cancer   . Family history of melanoma   . Family history of pancreatic cancer   . Family history of stomach cancer   .  Family history of uterine cancer   . Fibroid   . GERD (gastroesophageal reflux disease)    takes protonix as needed   . Hypothyroidism   . 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 Tennis elbow 2017   Right  . Tinnitus 2020   both ears    PAST SURGICAL HISTORY: Past Surgical History:  Procedure Laterality Date  . ADENOIDECTOMY     per patient as a child  . BILATERAL SALPINGECTOMY Bilateral 02/14/2015   Procedure: BILATERAL SALPINGECTOMY OOPHORECTOMY;  Surgeon: Nunzio Cobbs, MD;  Location: [facility];  Service: Gynecology;  Laterality: Bilateral;  . BREAST BIOPSY  1987   Dr. Rosana Hoes  . BREAST EXCISIONAL BIOPSY Left    benign  . BREAST LUMPECTOMY WITH RADIOACTIVE SEED AND SENTINEL LYMPH NODE BIOPSY Right 12/23/2019   Procedure: RIGHT BREAST LUMPECTOMY X 3 WITH RADIOACTIVE SEED AND SENTINEL LYMPH NODE MAPPING;  Surgeon: Erroll Luna, MD;  Location: [facility];  Service: General;  Laterality: Right;  . BREAST SURGERY Right 04/2010   breast biopsy for multiple fibroadenoma - no atypia  . CATARACT EXTRACTION, BILATERAL    . CESAREAN SECTION    . CRYOTHERAPY  5/91   for persistent condyloma atypia  . CYSTOSCOPY N/A 02/14/2015   Procedure: CYSTOSCOPY;  Surgeon: Nunzio Cobbs, MD;  Location: [facility];  Service: Gynecology;  Laterality: N/A;  . LASER ABLATION OF CONDYLOMAS  03/05/90 Dr. 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 MELANOMA EXCISION Right 03/2013   melanoma right arm  . ROBOTIC ASSISTED TOTAL HYSTERECTOMY N/A 02/14/2015   Procedure: ROBOTIC ASSISTED TOTAL HYSTERECTOMY;  Surgeon: Nunzio Cobbs, MD;  Location: [facility];  Service: Gynecology;  Laterality: N/A;  . SKIN CANCER EXCISION  06/2013   midddle of back with path report of atypia  . TUBAL LIGATION Bilateral   . WISDOM TOOTH EXTRACTION      FAMILY HISTORY: Family History  Problem Relation Age of Onset  . Cancer Mother        lung and liver  . Alcohol abuse Father        Cirrhosis of liver  . Cancer Maternal Aunt        stomach cancer  . Cancer Maternal Grandmother        uterine cancer  . Heart failure Paternal  Grandmother   . Lung cancer Maternal Uncle   . Lung cancer Maternal Uncle   . Pancreatic cancer Paternal Uncle   . Testicular cancer Cousin   . Melanoma Cousin        of eye  . Cancer Cousin        possible colon? dx 81s  . Colon cancer Other   . Breast cancer Cousin   Her father died at age 67 from alcoholism. Her mother was diagnosed with lung cancer at age 82 and died at age 33. Alexis Young is an only child. She reports uterine cancer in her maternal grandmother at age 46, stomach cancer in a maternal aunt at age 31, lung cancer in two maternal uncles, and colon cancer in a maternal great-aunt.   GYNECOLOGIC HISTORY:  Patient's last menstrual period was 10/15/2014 (exact date). Menarche: 54 years old Age at first live birth: 54 years old [place] P 1 LMP: 2009 Contraceptive: used for 14 years without issue HRT used for 12 years  Hysterectomy? Yes, 01/2015, benign pathology BSO? 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 Husband Richardson Landry is a Musician.  Son Alexis Young, age 51, is a Film/video editor.     ADVANCED DIRECTIVES: In the absence of any documentation to the contrary, the patient's spouse is their HCPOA.    HEALTH MAINTENANCE: Social History   Tobacco Use  . Smoking status: Never Smoker  . Smokeless tobacco: Never Used  Vaping Use  . Vaping Use: Never used  Substance Use Topics  . Alcohol use: No  . Drug use: No     Colonoscopy: 02/2016, repeat 2027  PAP: 03/2019, negative  Bone density: 09/2019, +0.3   Allergies  Allergen Reactions  . Tetracyclines & Related Nausea Only  . Asa [Aspirin] Nausea And Vomiting and Rash  . Sulfa Antibiotics Rash    Current Outpatient Medications  Medication Sig Dispense Refill  . gabapentin (NEURONTIN) 300 MG capsule Take 1 capsule (300 mg total) by mouth at bedtime. 90 capsule 4  . levothyroxine (SYNTHROID) 50 MCG tablet TAKE 1 TABLET BY MOUTH EVERY DAY 90 tablet 1    . Polyethyl Glycol-Propyl Glycol (SYSTANE OP) Place 1 drop into both eyes daily as needed (dry eyes). (Patient not taking: Reported on 03/23/2020)    . tamoxifen (NOLVADEX) 20 MG tablet Take 1 tablet (20 mg total) by mouth daily. 90 tablet 4  . tretinoin (RETIN-A) 0.025 % cream APPLY A PEA SIZE AMOUNT TO AFFECTED AREAS NIGHTLY. (Patient not taking: Reported on 03/23/2020)     No current facility-administered medications for this visit.    OBJECTIVE: White Young in no acute distress  Vitals:   03/28/20 1531  BP: (!) 143/69  Pulse: 65  Resp: 18  Temp: 97.6 F (36.4 C)     Body mass index is 29.18 kg/m.   Wt Readings from Last 3 Encounters:  03/28/20 180 lb 12.8 oz (82 kg)  03/23/20 182 lb (82.6 kg)  01/13/20 188 lb (85.3 kg)      ECOG FS:1 - Symptomatic but completely ambulatory  Sclerae unicteric, EOMs intact Wearing a mask No cervical or supraclavicular adenopathy Lungs no rales or rhonchi Heart regular rate and rhythm Abd soft, nontender, positive bowel sounds MSK no focal spinal tenderness, no upper extremity lymphedema Neuro: nonfocal, well oriented, appropriate affect Breasts: The right breast is status post lumpectomy and radiation.  There is an area of induration in the superior aspect of the breast which the patient is aware of and is being massage through physical therapy.  There is the expected coarsening of the skin.  There is no evidence of local recurrence.  In the left breast palpating from the underside up right next to the nipple at about 500 there is a half a centimeter palpable mass which is nontender and not associated with nipple retraction or erythema.  Both axillae are benign.   LAB RESULTS:  CMP     Component Value Date/Time   NA 141 03/28/2020 1506   NA 142 01/13/2017 1024   K 3.8 03/28/2020 1506   CL 108 03/28/2020 1506   CO2 25 03/28/2020 1506   GLUCOSE 90 03/28/2020 1506   BUN 10 03/28/2020 1506   BUN 14 01/13/2017 1024   CREATININE 0.93  03/28/2020 1506   CREATININE 0.92 11/10/2019 0812   CREATININE 0.94 01/10/2016 1001   CALCIUM 10.1 03/28/2020 1506   PROT 7.6 03/28/2020 1506   PROT 6.8 01/13/2017 1024   ALBUMIN 4.2 03/28/2020 1506   ALBUMIN 4.5 01/13/2017 1024  AST 25 03/28/2020 1506   AST 12 (L) 11/10/2019 0812   ALT 20 03/28/2020 1506   ALT 10 11/10/2019 0812   ALKPHOS 89 03/28/2020 1506   BILITOT 0.7 03/28/2020 1506   BILITOT 0.6 11/10/2019 0812   GFRNONAA >60 03/28/2020 1506   GFRNONAA >60 11/10/2019 0812   GFRAA >60 03/28/2020 1506   GFRAA >60 11/10/2019 0812    No results found for: TOTALPROTELP, ALBUMINELP, A1GS, A2GS, BETS, BETA2SER, GAMS, MSPIKE, SPEI  Lab Results  Component Value Date   WBC 3.6 (L) 03/28/2020   NEUTROABS 2.2 03/28/2020   HGB 13.0 03/28/2020   HCT 39.9 03/28/2020   MCV 85.8 03/28/2020   PLT 208 03/28/2020    No results found for: LABCA2  No components found for: [id number]  No results for input(s): INR in the last 168 hours.  No results found for: LABCA2  No results found for: DGU440  No results found for: HKV425  No results found for: ZDG387  No results found for: CA2729  No components found for: HGQUANT  No results found for: CEA1 / No results found for: CEA1   No results found for: AFPTUMOR  No results found for: CHROMOGRNA  No results found for: KPAFRELGTCHN, LAMBDASER, KAPLAMBRATIO (kappa/lambda light chains)  No results found for: HGBA, HGBA2QUANT, HGBFQUANT, HGBSQUAN (Hemoglobinopathy evaluation)   No results found for: LDH  No results found for: IRON, TIBC, IRONPCTSAT (Iron and TIBC)  No results found for: FERRITIN  Urinalysis    Component Value Date/Time   COLORURINE YELLOW 06/17/2018 [place] 06/17/2018 1649   LABSPEC 1.010 06/17/2018 1649   PHURINE 7.0 06/17/2018 1649   GLUCOSEU NEGATIVE 06/17/2018 1649   HGBUR TRACE-INTACT (A) 06/17/2018 1649   BILIRUBINUR n 04/27/2019 0913   KETONESUR NEGATIVE 06/17/2018 1649    PROTEINUR Negative 04/27/2019 0913   UROBILINOGEN 0.2 04/27/2019 0913   UROBILINOGEN 0.2 06/17/2018 1649   NITRITE n 04/27/2019 0913   NITRITE NEGATIVE 06/17/2018 1649   LEUKOCYTESUR Negative 04/27/2019 0913     STUDIES: US BREAST LTD UNI LEFT INC AXILLA  Result Date: 03/23/2020 CLINICAL DATA:  54 year old female presenting for evaluation of a palpable lump in the left breast. She found this about 2 months ago. She has history of right breast lumpectomy for invasive lobular carcinoma in May of 2021. EXAM: DIGITAL DIAGNOSTIC UNILATERAL LEFT MAMMOGRAM WITH TOMO AND CAD; ULTRASOUND LEFT BREAST LIMITED COMPARISON:  Previous exam(s). ACR Breast Density Category c: The breast tissue is heterogeneously dense, which may obscure small masses. FINDINGS: A BB indicating the palpable site of concern has been placed on the lateral periareolar left breast. No suspicious mammographic findings are seen deep to this marker. No suspicious calcifications, masses or areas of distortion are seen in the left breast. Mammographic images were processed with CAD. Ultrasound targeted to the left breast at 3 o'clock in the subareolar location demonstrates a heterogeneous hypoechoic oval complex mass with indistinct margins measuring 1.1 x 0.6 x 0.9 cm. No blood flow is seen within the mass on color Doppler imaging. There are 2 adjacent benign appearing cysts in the retroareolar left breast at 3 o'clock. Ultrasound of the left axilla demonstrates multiple normal-appearing lymph nodes. IMPRESSION: 1. There is an indeterminate 1.1 cm complex mass in the retroareolar left breast at 3 o'clock. 2.  No evidence of left axillary lymphadenopathy. RECOMMENDATION: Ultrasound guided biopsy is recommended for the left breast mass, and has been scheduled for 04/05/2020 at 8:30 a.m. I have discussed the findings and  recommendations with the patient. If applicable, a reminder letter will be sent to the patient regarding the next appointment.  BI-RADS CATEGORY  4: Suspicious. Electronically Signed   By: Ammie Ferrier M.D.   On: 03/23/2020 11:13   MM DIAG BREAST TOMO UNI LEFT  Result Date: 03/23/2020 CLINICAL DATA:  54 year old female presenting for evaluation of a palpable lump in the left breast. She found this about 2 months ago. She has history of right breast lumpectomy for invasive lobular carcinoma in May of 2021. EXAM: DIGITAL DIAGNOSTIC UNILATERAL LEFT MAMMOGRAM WITH TOMO AND CAD; ULTRASOUND LEFT BREAST LIMITED COMPARISON:  Previous exam(s). ACR Breast Density Category c: The breast tissue is heterogeneously dense, which may obscure small masses. FINDINGS: A BB indicating the palpable site of concern has been placed on the lateral periareolar left breast. No suspicious mammographic findings are seen deep to this marker. No suspicious calcifications, masses or areas of distortion are seen in the left breast. Mammographic images were processed with CAD. Ultrasound targeted to the left breast at 3 o'clock in the subareolar location demonstrates a heterogeneous hypoechoic oval complex mass with indistinct margins measuring 1.1 x 0.6 x 0.9 cm. No blood flow is seen within the mass on color Doppler imaging. There are 2 adjacent benign appearing cysts in the retroareolar left breast at 3 o'clock. Ultrasound of the left axilla demonstrates multiple normal-appearing lymph nodes. IMPRESSION: 1. There is an indeterminate 1.1 cm complex mass in the retroareolar left breast at 3 o'clock. 2.  No evidence of left axillary lymphadenopathy. RECOMMENDATION: Ultrasound guided biopsy is recommended for the left breast mass, and has been scheduled for 04/05/2020 at 8:30 a.m. I have discussed the findings and recommendations with the patient. If applicable, a reminder letter will be sent to the patient regarding the next appointment. BI-RADS CATEGORY  4: Suspicious. Electronically Signed   By: Ammie Ferrier M.D.   On: 03/23/2020 11:13     ELIGIBLE FOR  AVAILABLE RESEARCH PROTOCOL: AET  ASSESSMENT: 54 y.o. Alexis Young, Alexis Young status post right breast upper outer quadrant biopsy 11/01/2019 for a clinical [place] N0 invasive lobular carcinoma, grade 1, estrogen and progesterone receptor positive, HER-2 not amplified, with an MIB-1 of 2%.  (a) breast MRI 11/22/2019 showed 2 additional right breast lesions and one left breast lesion, all of which were biopsied and all of which were benign  (1) status post right lumpectomy 12/23/2019 for a pT1a pN0, stage IA invasive lobular breast cancer, grade 2, with negative margins.  (a) a total of 2 axillary lymph nodes were removed  (2) adjuvant radiation Radiation Treatment Dates: 01/24/2020 through 02/21/2020 Site Technique Total Dose (Gy) Dose per Fx (Gy) Completed Fx Beam Energies  Breast, Right: Breast_Rt 3D 40.05/40.05 2.67 15/15 6X, 10X  Breast, Right: Breast_Rt_Bst 3D 10/10 2 5/5 6X, 10X    (3) genetic testing 11/19/2019 through the [facility] Common Hereditary Cancers + Melanoma panel.  found no deleterious mutations in APC, ATM, AXIN2, BARD1, BMPR1A, BRCA1, BRCA2, BRIP1, CDH1, CDKN2A (p14ARF), CDKN2A (p16INK4a), CKD4, CHEK2, CTNNA1, DICER1, EPCAM (Deletion/duplication testing only), GREM1 (promoter region deletion/duplication testing only), KIT, MEN1, MLH1, MSH2, MSH3, MSH6, MUTYH, NBN, NF1, NHTL1, PALB2, PDGFRA, PMS2, POLD1, POLE, PTEN, RAD50, RAD51C, RAD51D, RNF43, SDHB, SDHC, SDHD, SMAD4, SMARCA4. STK11, TP53, TSC1, TSC2, and VHL.  The following genes were evaluated for sequence changes only: SDHA and HOXB13 c.251G>A variant only.  (a) The [facility] Melanoma Panel analyzed the following 9 genes: BAP1 BRCA2 CDK4 CDKN2A MITF POT1 PTEN RB1 Tp53.  (b) VUS  noted in APC, POLE, and RNF43  (4) to start tamoxifen 04/04/2020  (a) status post hysterectomy   PLAN: Alexis Young has completed local treatment for her breast cancer and is ready to consider systemic therapy.  She understands that antiestrogens will reduce the  risk of breast cancer by half.  The risk of this breast cancer recurring in the breast or outside the breast are both quite low, under 10%, so any treatment at this point beyond what she has already had is optional.  On the other hand the possibility of her developing a second breast cancer in either breast is greater.  That is about 1 %/year.  If she lives in over 30 years which would be about average she would have a 30% chance or so of developing another breast cancer.  She can cut that in half by taking antiestrogens.  In younger women we generally prefer to use tamoxifen.  It does not block estrogen production. It does not "take away a Young's estrogen". It blocks the estrogen receptor in breast cells. Like anastrozole, it can also cause hot flashes. As opposed to anastrozole, tamoxifen has many estrogen-like effects. It is technically an estrogen receptor modulator. This means that in some tissues tamoxifen works like estrogen-- for example it helps strengthen the bones. It tends to improve the cholesterol profile. It can cause thickening of the endometrial lining, and even endometrial polyps or rarely cancer of the uterus.(The risk of uterine cancer due to tamoxifen is one additional cancer per thousand women year). It can cause vaginal wetness or stickiness. It can cause blood clots through this estrogen-like effect--the risk of blood clots with tamoxifen is exactly the same as with birth control pills or hormone replacement.  Neither of these agents causes mood changes or weight gain, despite the popular belief that they can have these side effects. We have data from studies comparing either of these drugs with placebo, and in those cases the control group had the same amount of weight gain and depression as the group that took the drug.  Even though Alexis Young is having quite a few hot flashes at baseline, she is interested in considering antiestrogens and I have written for her to start tamoxifen  04/04/2020.  We will have a virtual visit first week in December just to make sure she is tolerating it well.  Assuming all is fine then she will have mammography in April and see me again in May.  Of course we are waiting on the left breast biopsy.  I think this is going to prove a complex but benign cyst.  If otherwise of course then she will return to see me sooner than what we planned above and consider what treatment may be necessary Total encounter time 35 minutes.Sarajane Jews C. Toriano Aikey, MD 03/28/2020 4:13 PM Medical Oncology and Hematology [facility] [place], [place] [address] Tel. [phone]    Fax. [phone]   This document serves as a record of services personally performed by Lurline Del, MD. It was created on his behalf by Wilburn Mylar, a trained medical scribe. The creation of this record is based on the scribe's personal observations and the provider's statements to them.   I, Lurline Del MD, have reviewed the above documentation for accuracy and completeness, and I agree with the above.   *Total Encounter Time as defined by the Centers for Medicare and Medicaid Services includes, in addition to the face-to-face time of a  patient visit (documented in the note above) non-face-to-face time: obtaining and reviewing outside history, ordering and reviewing medications, tests or procedures, care coordination (communications with other health care professionals or caregivers) and documentation in the medical record.

## 2020-03-29 ENCOUNTER — Telehealth: Payer: Self-pay | Admitting: Oncology

## 2020-03-29 NOTE — Telephone Encounter (Signed)
Scheduled appts per 8/31 los. Pt confirmed appt dates and times.

## 2020-03-31 ENCOUNTER — Ambulatory Visit: Payer: BC Managed Care – PPO | Attending: Surgery

## 2020-03-31 ENCOUNTER — Other Ambulatory Visit: Payer: Self-pay

## 2020-03-31 DIAGNOSIS — Z9889 Other specified postprocedural states: Secondary | ICD-10-CM | POA: Insufficient documentation

## 2020-03-31 DIAGNOSIS — R600 Localized edema: Secondary | ICD-10-CM | POA: Diagnosis present

## 2020-03-31 DIAGNOSIS — M25611 Stiffness of right shoulder, not elsewhere classified: Secondary | ICD-10-CM | POA: Insufficient documentation

## 2020-03-31 DIAGNOSIS — Z483 Aftercare following surgery for neoplasm: Secondary | ICD-10-CM | POA: Diagnosis present

## 2020-03-31 NOTE — Therapy (Signed)
[place], [place], [address] Phone: [phone]   Fax:  [phone]  Physical Therapy Treatment  Patient Details  Name: Alexis Young MRN: [id number] Date of Birth: [birth year] Referring Provider (PT): Dr. Brantley Stage   Encounter Date: 03/31/2020   PT End of Session - 03/31/20 1107    Visit Number 3    Number of Visits 10    Date for PT Re-Evaluation 05/11/20    PT Start Time 1006    PT Stop Time 1106    PT Time Calculation (min) 60 min    Activity Tolerance Patient tolerated treatment well    Behavior During Therapy [facility] for tasks assessed/performed           Past Medical History:  Diagnosis Date  . Arthritis   . Breast cancer ([place]) 11/02/2019   right breast  . Cancer ([place]) 03/2013, 06/2013   malignant melanoma on right arm and back  . Complication of anesthesia    Per patient after hysterectomy in 2016, had to stay over an extra night due to low oxygen levels  . Dyspareunia   . Elevated serum creatinine   . Family history of colon cancer   . Family history of lung cancer   . Family history of melanoma   . Family history of pancreatic cancer   . Family history of stomach cancer   . Family history of uterine cancer   . Fibroid   . GERD (gastroesophageal reflux disease)    takes protonix as needed   . Hypothyroidism   . 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 Tennis elbow 2017   Right  . Tinnitus 2020   both ears    Past Surgical History:  Procedure Laterality Date  . ADENOIDECTOMY     per patient as a child  . BILATERAL SALPINGECTOMY Bilateral 02/14/2015   Procedure: BILATERAL SALPINGECTOMY OOPHORECTOMY;  Surgeon: Nunzio Cobbs, MD;  Location: [facility];  Service: Gynecology;  Laterality: Bilateral;  . BREAST BIOPSY  1987   Dr. Rosana Hoes  . BREAST EXCISIONAL BIOPSY Left    benign  . BREAST LUMPECTOMY WITH RADIOACTIVE SEED AND SENTINEL LYMPH NODE  BIOPSY Right 12/23/2019   Procedure: RIGHT BREAST LUMPECTOMY X 3 WITH RADIOACTIVE SEED AND SENTINEL LYMPH NODE MAPPING;  Surgeon: Erroll Luna, MD;  Location: [facility];  Service: General;  Laterality: Right;  . BREAST SURGERY Right 04/2010   breast biopsy for multiple fibroadenoma - no atypia  . CATARACT EXTRACTION, BILATERAL    . CESAREAN SECTION    . CRYOTHERAPY  5/91   for persistent condyloma atypia  . CYSTOSCOPY N/A 02/14/2015   Procedure: CYSTOSCOPY;  Surgeon: Nunzio Cobbs, MD;  Location: [facility];  Service: Gynecology;  Laterality: N/A;  . LASER ABLATION OF CONDYLOMAS  03/05/90 Dr. 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 MELANOMA EXCISION Right 03/2013   melanoma right arm  . ROBOTIC ASSISTED TOTAL HYSTERECTOMY N/A 02/14/2015   Procedure: ROBOTIC ASSISTED TOTAL HYSTERECTOMY;  Surgeon: Nunzio Cobbs, MD;  Location: [facility];  Service: Gynecology;  Laterality: N/A;  . SKIN CANCER EXCISION  06/2013   midddle of back with path report of atypia  . TUBAL LIGATION Bilateral   . WISDOM TOOTH EXTRACTION      There were no vitals filed for this visit.   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 I have to have another biopsy next week to my Lt breast.    Pertinent History Right lumpectomy x 3 with SLNB of 2 nodes removed both negative on 12/23/19 by Dr. Brantley Stage.  ER/PR positive, HER2negative.  [place].  Radiation started yesterday.  History of hysterectomy.    Patient Stated Goals just do what I need to do    Currently in Pain? No/denies                             [facility] Adult PT Treatment/Exercise - 03/31/20 0001      Self-Care   Other Self-Care Comments  PTA present during fitting of compression bra to answer pts questions about bra and swell spot during measurements      Manual Therapy   Edema Management --    Manual Lymphatic Drainage (MLD) In  Supine: Short neck, superficial and deep abdominals, Rt inguinal and Lt axillary nodes, Rt axillo-inguinal and anterior inter-axillary anastomosis, then forcused on Rt breast, then into Lt S/L for further work to lateral breast redirecting fluid towards Rt axillo-inguinal and posterior inter-axillary anastomosis, finished in supine retracing all steps to nodes reviewing with pt throughout.                       PT Long Term Goals - 03/16/20 1000      PT LONG TERM GOAL #1   Title Pt will improve Rt shoulder flexion to at least 156 degrees to equal the well arm    Status Achieved      PT LONG TERM GOAL #2   Title pt will be ind with stretch ABC or similar for continued strength and mobility and lymphedema risk reduction    Status On-going      PT LONG TERM GOAL #3   Title Pt will report decrease in Rt breast fibrosis and heaviness by at least 50%    Time 8    Period Weeks    Status Revised                 Plan - 03/31/20 1505    Clinical Impression Statement Pt returns today having been compliant with self MLD since here last but benefitted from review of this. She was also measured for a compression bra and swell spot today as well. One bra will be ordered for now and once that comes, if good fit and comfortable, more will be ordered per what her insurance will cover. Pt did well with review of sequence today requiring mod tactile and VCs for less pressure and correct direction of skin stretch. Also instructed her how to perform in Lt S/L for better ease of access to lateral breast. Pt verbalized good understanding of all. Fibrosis was noted at medial and lateral aspects of lower breast prior to treatment today. By end of session, medial aspect was much softer, and some reduction noted at lateral aspect where more congested. Also included instruction of anterior inter-axillary anastomosis    Personal Factors and Comorbidities Comorbidity 2    Comorbidities radiation, SLNB     Examination-Activity Limitations Reach Overhead    Examination-Participation Restrictions Yard Work;Cleaning    Stability/Clinical Decision Making Evolving/Moderate complexity    Rehab Potential Excellent    PT Frequency 1x / week    PT Duration 8 weeks    PT Treatment/Interventions ADLs/Self Care  Home Management;Patient/family education;Passive range of motion;Manual lymph drainage;Manual techniques;Scar mobilization    PT Next Visit Plan any questions on self MLD Rt breast, continue and review Rt breast MLD; assess fit of compression bra if she receives and brings this    PT Home Exercise Plan doorway stretch, wall flexion, scapular retractions, self MLD lateral breast    Consulted and Agree with Plan of Care Patient           Patient will benefit from skilled therapeutic intervention in order to improve the following deficits and impairments:  Decreased mobility, Decreased range of motion, Decreased knowledge of precautions, Increased edema  Visit Diagnosis: Aftercare following surgery for neoplasm  H/O lumpectomy  Localized edema     Problem List Patient Active Problem List   Diagnosis Date Noted  . Genetic testing 11/19/2019  . Family history of stomach cancer   . Family history of uterine cancer   . Family history of colon cancer   . Family history of melanoma   . Family history of pancreatic cancer   . Family history of lung cancer   . Malignant neoplasm of upper-outer quadrant of right breast in female, estrogen receptor positive ([place]) 11/04/2019  . Sleep difficulties 06/29/2019  . 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 Tinnitus of right ear 03/12/2019  . Dysfunction of right eustachian tube 06/17/2018  . Hyperlipidemia 01/15/2018  . Prediabetes 01/15/2018  . Palpitations 01/15/2018  . Hypothyroidism 07/31/2016  . Status post laparoscopic hysterectomy 02/14/2015  . Fatigue 05/03/2014  . Premature  ovarian failure 09/21/2013    Otelia Limes, PTA 03/31/2020, 3:13 PM  [place], [place], [address] Phone: [phone]   Fax:  [phone]  Name: JOYICE MAGDA MRN: [id number] Date of Birth: 19-Nov-1965

## 2020-04-05 ENCOUNTER — Ambulatory Visit
Admission: RE | Admit: 2020-04-05 | Discharge: 2020-04-05 | Disposition: A | Discharge status: Home / Self Care | Payer: BC Managed Care – PPO | Source: Ambulatory Visit | Attending: Oncology | Admitting: Oncology

## 2020-04-05 ENCOUNTER — Other Ambulatory Visit: Payer: Self-pay

## 2020-04-05 DIAGNOSIS — Z170 Estrogen receptor positive status [ER+]: Secondary | ICD-10-CM

## 2020-04-05 DIAGNOSIS — C50411 Malignant neoplasm of upper-outer quadrant of right female breast: Secondary | ICD-10-CM

## 2020-04-05 DIAGNOSIS — N6320 Unspecified lump in the left breast, unspecified quadrant: Secondary | ICD-10-CM

## 2020-04-10 ENCOUNTER — Ambulatory Visit: Payer: Self-pay

## 2020-04-21 ENCOUNTER — Ambulatory Visit: Payer: BC Managed Care – PPO | Admitting: Rehabilitation

## 2020-04-21 ENCOUNTER — Other Ambulatory Visit: Payer: Self-pay

## 2020-04-21 ENCOUNTER — Encounter: Payer: Self-pay | Admitting: Rehabilitation

## 2020-04-21 DIAGNOSIS — Z483 Aftercare following surgery for neoplasm: Secondary | ICD-10-CM

## 2020-04-21 DIAGNOSIS — R600 Localized edema: Secondary | ICD-10-CM

## 2020-04-21 DIAGNOSIS — M25611 Stiffness of right shoulder, not elsewhere classified: Secondary | ICD-10-CM

## 2020-04-21 DIAGNOSIS — Z9889 Other specified postprocedural states: Secondary | ICD-10-CM

## 2020-04-21 NOTE — Therapy (Signed)
[place], [place], [address] Phone: [phone]   Fax:  [phone]  Physical Therapy Treatment  Patient Details  Name: Alexis Young MRN: [id number] Date of Birth: March 23, 1966 Referring Provider (PT): Dr. Brantley Stage   Encounter Date: 04/21/2020   PT End of Session - 04/21/20 1246    Visit Number 4    Number of Visits 10    Date for PT Re-Evaluation 05/11/20    PT Start Time 1102    PT Stop Time 1159    PT Time Calculation (min) 57 min    Activity Tolerance Patient tolerated treatment well    Behavior During Therapy [facility] for tasks assessed/performed           Past Medical History:  Diagnosis Date  . Arthritis   . Breast cancer ([place]) 11/02/2019   right breast  . Cancer ([place]) 03/2013, 06/2013   malignant melanoma on right arm and back  . Complication of anesthesia    Per patient after hysterectomy in 2016, had to stay over an extra night due to low oxygen levels  . Dyspareunia   . Elevated serum creatinine   . Family history of colon cancer   . Family history of lung cancer   . Family history of melanoma   . Family history of pancreatic cancer   . Family history of stomach cancer   . Family history of uterine cancer   . Fibroid   . GERD (gastroesophageal reflux disease)    takes protonix as needed   . Hypothyroidism   . 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 Tennis elbow 2017   Right  . Tinnitus 2020   both ears    Past Surgical History:  Procedure Laterality Date  . ADENOIDECTOMY     per patient as a child  . BILATERAL SALPINGECTOMY Bilateral 02/14/2015   Procedure: BILATERAL SALPINGECTOMY OOPHORECTOMY;  Surgeon: Nunzio Cobbs, MD;  Location: [facility];  Service: Gynecology;  Laterality: Bilateral;  . BREAST BIOPSY  1987   Dr. Rosana Hoes  . BREAST EXCISIONAL BIOPSY Left    benign  . BREAST LUMPECTOMY WITH RADIOACTIVE SEED AND SENTINEL LYMPH NODE  BIOPSY Right 12/23/2019   Procedure: RIGHT BREAST LUMPECTOMY X 3 WITH RADIOACTIVE SEED AND SENTINEL LYMPH NODE MAPPING;  Surgeon: Erroll Luna, MD;  Location: [facility];  Service: General;  Laterality: Right;  . BREAST SURGERY Right 04/2010   breast biopsy for multiple fibroadenoma - no atypia  . CATARACT EXTRACTION, BILATERAL    . CESAREAN SECTION    . CRYOTHERAPY  5/91   for persistent condyloma atypia  . CYSTOSCOPY N/A 02/14/2015   Procedure: CYSTOSCOPY;  Surgeon: Nunzio Cobbs, MD;  Location: [facility];  Service: Gynecology;  Laterality: N/A;  . LASER ABLATION OF CONDYLOMAS  03/05/90 Dr. 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 MELANOMA EXCISION Right 03/2013   melanoma right arm  . ROBOTIC ASSISTED TOTAL HYSTERECTOMY N/A 02/14/2015   Procedure: ROBOTIC ASSISTED TOTAL HYSTERECTOMY;  Surgeon: Nunzio Cobbs, MD;  Location: [facility];  Service: Gynecology;  Laterality: N/A;  . SKIN CANCER EXCISION  06/2013   midddle of back with path report of atypia  . TUBAL LIGATION Bilateral   . WISDOM TOOTH EXTRACTION      There were no vitals filed for this visit.   Subjective Assessment - 04/21/20 1103    Subjective The breast  is just hard.  I still have not gotten my bra    Pertinent History Right lumpectomy x 3 with SLNB of 2 nodes removed both negative on 12/23/19 by Dr. Brantley Stage.  ER/PR positive, HER2negative.  [place].  Radiation started yesterday.  History of hysterectomy.    Patient Stated Goals just do what I need to do    Currently in Pain? No/denies                  L-DEX FLOWSHEETS - 04/21/20 1100      L-DEX LYMPHEDEMA SCREENING   Measurement Type Unilateral    L-DEX MEASUREMENT EXTREMITY Upper Extremity    POSITION  Standing    DOMINANT SIDE Right    At Risk Side Right    BASELINE SCORE (UNILATERAL) -1.2                     [facility] Adult PT Treatment/Exercise - 04/21/20 0001      Manual Therapy   Manual therapy comments performed SO[place] so  patient does not have to return on Monday.     Edema Management called a special place who said pt could come over after treament to get a [place] bra size large and insurance could be processed.      Manual Lymphatic Drainage (MLD) In Supine: Short neck, superficial and deep abdominals, Rt inguinal and Lt axillary nodes, Rt axillo-inguinal and anterior inter-axillary anastomosis, then forcused on Rt breast, then into Lt S/L for further work to lateral breast redirecting fluid towards Rt axillo-inguinal and posterior inter-axillary anastomosis, finished in supine retracing all steps to nodes reviewing with pt throughout.                  PT Education - 04/21/20 1246    Education Details SO[place] and screening    Person(s) Educated Patient    Methods Explanation    Comprehension Verbalized understanding               PT Long Term Goals - 03/16/20 1000      PT LONG TERM GOAL #1   Title Pt will improve Rt shoulder flexion to at least 156 degrees to equal the well arm    Status Achieved      PT LONG TERM GOAL #2   Title pt will be ind with stretch ABC or similar for continued strength and mobility and lymphedema risk reduction    Status On-going      PT LONG TERM GOAL #3   Title Pt will report decrease in Rt breast fibrosis and heaviness by at least 50%    Time 8    Period Weeks    Status Revised                 Plan - 04/21/20 1247    Clinical Impression Statement Pt is compliant with self MLD and use of foam but has still not recieved a compression bra so pt was given information on how to obtain this directly after therapy as email search revelaed that [employer] was out of network and the order was not processed.  Pt continues with lateral breast fibrosis and scar tissue from lumpectomy.  Pt recently had a left breast biopsy which was just scar tissue.    PT Frequency 1x / week    PT Duration 8 weeks    PT Treatment/Interventions ADLs/Self Care Home  Management;Patient/family education;Passive range of motion;Manual lymph drainage;Manual techniques;Scar mobilization    PT Next Visit Plan  any questions on self MLD Rt breast, continue and review Rt breast MLD; assess fit of compression bra if she receives and brings this    PT Home Exercise Plan doorway stretch, wall flexion, scapular retractions, self MLD lateral breast    Consulted and Agree with Plan of Care Patient           Patient will benefit from skilled therapeutic intervention in order to improve the following deficits and impairments:     Visit Diagnosis: Aftercare following surgery for neoplasm  H/O lumpectomy  Localized edema  Stiffness of right shoulder, not elsewhere classified     Problem List Patient Active Problem List   Diagnosis Date Noted  . Genetic testing 11/19/2019  . Family history of stomach cancer   . Family history of uterine cancer   . Family history of colon cancer   . Family history of melanoma   . Family history of pancreatic cancer   . Family history of lung cancer   . Malignant neoplasm of upper-outer quadrant of right breast in female, estrogen receptor positive ([place]) 11/04/2019  . Sleep difficulties 06/29/2019  . 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 Tinnitus of right ear 03/12/2019  . Dysfunction of right eustachian tube 06/17/2018  . Hyperlipidemia 01/15/2018  . Prediabetes 01/15/2018  . Palpitations 01/15/2018  . Hypothyroidism 07/31/2016  . Status post laparoscopic hysterectomy 02/14/2015  . Fatigue 05/03/2014  . Premature ovarian failure 09/21/2013    Stark Bray 04/21/2020, 12:50 PM  [place], [place], [address] Phone: [phone]   Fax:  [phone]  Name: MARLINA CATALDI MRN: [id number] Date of Birth: 09-29-1965

## 2020-04-24 ENCOUNTER — Ambulatory Visit: Payer: BC Managed Care – PPO

## 2020-04-28 ENCOUNTER — Ambulatory Visit: Payer: BC Managed Care – PPO | Attending: Surgery | Admitting: Rehabilitation

## 2020-04-28 ENCOUNTER — Other Ambulatory Visit: Payer: Self-pay

## 2020-04-28 ENCOUNTER — Encounter: Payer: Self-pay | Admitting: Rehabilitation

## 2020-04-28 DIAGNOSIS — R600 Localized edema: Secondary | ICD-10-CM | POA: Diagnosis present

## 2020-04-28 DIAGNOSIS — Z9889 Other specified postprocedural states: Secondary | ICD-10-CM | POA: Insufficient documentation

## 2020-04-28 DIAGNOSIS — Z483 Aftercare following surgery for neoplasm: Secondary | ICD-10-CM | POA: Diagnosis not present

## 2020-04-28 DIAGNOSIS — M25611 Stiffness of right shoulder, not elsewhere classified: Secondary | ICD-10-CM | POA: Diagnosis present

## 2020-04-28 NOTE — Therapy (Signed)
[place], [place], [address] Phone: [phone]   Fax:  [phone]  Physical Therapy Treatment  Patient Details  Name: Alexis Young MRN: [id number] Date of Birth: 18-Oct-1965 Referring Provider (PT): Dr. Brantley Stage   Encounter Date: 04/28/2020   PT End of Session - 04/28/20 0953    Visit Number 5    Number of Visits 10    Date for PT Re-Evaluation 05/11/20    PT Start Time 0904    PT Stop Time 0954    PT Time Calculation (min) 50 min    Activity Tolerance Patient tolerated treatment well    Behavior During Therapy [facility] for tasks assessed/performed           Past Medical History:  Diagnosis Date  . Arthritis   . Breast cancer ([place]) 11/02/2019   right breast  . Cancer ([place]) 03/2013, 06/2013   malignant melanoma on right arm and back  . Complication of anesthesia    Per patient after hysterectomy in 2016, had to stay over an extra night due to low oxygen levels  . Dyspareunia   . Elevated serum creatinine   . Family history of colon cancer   . Family history of lung cancer   . Family history of melanoma   . Family history of pancreatic cancer   . Family history of stomach cancer   . Family history of uterine cancer   . Fibroid   . GERD (gastroesophageal reflux disease)    takes protonix as needed   . Hypothyroidism   . 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 Tennis elbow 2017   Right  . Tinnitus 2020   both ears    Past Surgical History:  Procedure Laterality Date  . ADENOIDECTOMY     per patient as a child  . BILATERAL SALPINGECTOMY Bilateral 02/14/2015   Procedure: BILATERAL SALPINGECTOMY OOPHORECTOMY;  Surgeon: Nunzio Cobbs, MD;  Location: [facility];  Service: Gynecology;  Laterality: Bilateral;  . BREAST BIOPSY  1987   Dr. Rosana Hoes  . BREAST EXCISIONAL BIOPSY Left    benign  . BREAST LUMPECTOMY WITH RADIOACTIVE SEED AND SENTINEL LYMPH NODE  BIOPSY Right 12/23/2019   Procedure: RIGHT BREAST LUMPECTOMY X 3 WITH RADIOACTIVE SEED AND SENTINEL LYMPH NODE MAPPING;  Surgeon: Erroll Luna, MD;  Location: [facility];  Service: General;  Laterality: Right;  . BREAST SURGERY Right 04/2010   breast biopsy for multiple fibroadenoma - no atypia  . CATARACT EXTRACTION, BILATERAL    . CESAREAN SECTION    . CRYOTHERAPY  5/91   for persistent condyloma atypia  . CYSTOSCOPY N/A 02/14/2015   Procedure: CYSTOSCOPY;  Surgeon: Nunzio Cobbs, MD;  Location: [facility];  Service: Gynecology;  Laterality: N/A;  . LASER ABLATION OF CONDYLOMAS  03/05/90 Dr. 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 MELANOMA EXCISION Right 03/2013   melanoma right arm  . ROBOTIC ASSISTED TOTAL HYSTERECTOMY N/A 02/14/2015   Procedure: ROBOTIC ASSISTED TOTAL HYSTERECTOMY;  Surgeon: Nunzio Cobbs, MD;  Location: [facility];  Service: Gynecology;  Laterality: N/A;  . SKIN CANCER EXCISION  06/2013   midddle of back with path report of atypia  . TUBAL LIGATION Bilateral   . WISDOM TOOTH EXTRACTION      There were no vitals filed for this visit.   Subjective Assessment - 04/28/20 0911    Subjective Got my  new bra but after wearing for 4 hours I was really hurting.  Tried caling [place] but never heard back.  Did speak to her the next day.  Explained it was too tight and was rubbing on my scar.  She is sending an extender.    Pertinent History Right lumpectomy x 3 with SLNB of 2 nodes removed both negative on 12/23/19 by Dr. Brantley Stage.  ER/PR positive, HER2negative.  [place].  Radiation started yesterday.  History of hysterectomy.    Patient Stated Goals just do what I need to do    Currently in Pain? Other (Comment)   No, just soreness in breast                            [facility] Adult PT Treatment/Exercise - 04/28/20 0001      Manual Therapy   Manual Therapy Manual Lymphatic Drainage (MLD);Edema management;Soft tissue mobilization    Soft tissue  mobilization to scar tissue around incision with deeper work to lateral breast area surrounding incision    Manual Lymphatic Drainage (MLD) In Supine: Short neck, superficial and deep abdominals, Rt inguinal and Lt axillary nodes, Rt axillo-inguinal and anterior inter-axillary anastomosis, then forcused on Rt breast, then into Lt S/L for further work to lateral breast redirecting fluid towards Rt axillo-inguinal and posterior inter-axillary anastomosis, finished in supine retracing all steps to nodes reviewing with pt throughout.                       PT Long Term Goals - 03/16/20 1000      PT LONG TERM GOAL #1   Title Pt will improve Rt shoulder flexion to at least 156 degrees to equal the well arm    Status Achieved      PT LONG TERM GOAL #2   Title pt will be ind with stretch ABC or similar for continued strength and mobility and lymphedema risk reduction    Status On-going      PT LONG TERM GOAL #3   Title Pt will report decrease in Rt breast fibrosis and heaviness by at least 50%    Time 8    Period Weeks    Status Revised                 Plan - 04/28/20 0955    Clinical Impression Statement Pt. recieved her first bra which was too tight and not comfortable.  They are sending her an extender.  Breast was feeling better today, with less edema.  Still uncomfortable with sidelying position unless she has a pillow for support.    Personal Factors and Comorbidities Comorbidity 2    PT Frequency 1x / week    PT Duration 8 weeks    PT Treatment/Interventions ADLs/Self Care Home Management;Patient/family education;Passive range of motion;Manual lymph drainage;Manual techniques;Scar mobilization    PT Next Visit Plan any questions on self MLD Rt breast, continue and review Rt breast MLD; assess fit of compression bra if she receives and brings this    Consulted and Agree with Plan of Care Patient           Patient will benefit from skilled therapeutic intervention  in order to improve the following deficits and impairments:     Visit Diagnosis: Aftercare following surgery for neoplasm  H/O lumpectomy  Localized edema  Stiffness of right shoulder, not elsewhere classified     Problem List Patient Active Problem List  Diagnosis Date Noted  . Genetic testing 11/19/2019  . Family history of stomach cancer   . Family history of uterine cancer   . Family history of colon cancer   . Family history of melanoma   . Family history of pancreatic cancer   . Family history of lung cancer   . Malignant neoplasm of upper-outer quadrant of right breast in female, estrogen receptor positive ([place]) 11/04/2019  . Sleep difficulties 06/29/2019  . 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 Tinnitus of right ear 03/12/2019  . Dysfunction of right eustachian tube 06/17/2018  . Hyperlipidemia 01/15/2018  . Prediabetes 01/15/2018  . Palpitations 01/15/2018  . Hypothyroidism 07/31/2016  . Status post laparoscopic hysterectomy 02/14/2015  . Fatigue 05/03/2014  . Premature ovarian failure 09/21/2013    Stark Bray 04/28/2020, 10:00 AM  [place], [place], [address] Phone: [phone]   Fax:  [phone]  Name: Alexis Young MRN: [id number] Date of Birth: [birth year]

## 2020-05-01 ENCOUNTER — Other Ambulatory Visit: Payer: Self-pay

## 2020-05-01 ENCOUNTER — Ambulatory Visit (INDEPENDENT_AMBULATORY_CARE_PROVIDER_SITE_OTHER): Payer: BC Managed Care – PPO | Admitting: Obstetrics and Gynecology

## 2020-05-01 ENCOUNTER — Encounter: Payer: Self-pay | Admitting: Obstetrics and Gynecology

## 2020-05-01 VITALS — BP 128/70 | HR 66 | Resp 16 | Ht 64.5 in | Wt 178.6 lb

## 2020-05-01 DIAGNOSIS — E039 Hypothyroidism, unspecified: Secondary | ICD-10-CM

## 2020-05-01 DIAGNOSIS — R350 Frequency of micturition: Secondary | ICD-10-CM | POA: Diagnosis not present

## 2020-05-01 DIAGNOSIS — Z01419 Encounter for gynecological examination (general) (routine) without abnormal findings: Secondary | ICD-10-CM | POA: Diagnosis not present

## 2020-05-01 DIAGNOSIS — R8290 Unspecified abnormal findings in urine: Secondary | ICD-10-CM | POA: Diagnosis not present

## 2020-05-01 LAB — POCT URINALYSIS DIPSTICK
Bilirubin, UA: NEGATIVE
Glucose, UA: NEGATIVE
Ketones, UA: NEGATIVE
Leukocytes, UA: NEGATIVE
Nitrite, UA: NEGATIVE
Protein, UA: POSITIVE — AB
Urobilinogen, UA: NEGATIVE E.U./dL — AB
pH, UA: 5 (ref 5.0–8.0)

## 2020-05-01 NOTE — Patient Instructions (Signed)

## 2020-05-01 NOTE — Progress Notes (Signed)
54 y.o. G34P1001 Married Caucasian female here for annual exam.    On Tamoxifen and having hot flashes.  Taking gabapentin.   Patient complaining of urinary frequency. Drinks a lot of H20. Not drinking coffee, tea or soda.  Daytime voiding is at least 5 - 6 times.  Up at night, twice to void.  Sometimes has incontinence if she cannot get there on time. Denies dysuria.   Patient wants labs to check her thyroid and cholesterol.  Has not taken her Covid vaccine or flu vaccine.   Urine Dip: 1+RBCs, Pos Protein  PCP:   Billey Gosling, MD  Patient's last menstrual period was 10/15/2014 (exact date).           Sexually active: Yes.    The current method of family planning is status post hysterectomy.    Exercising: Yes.    walks daily Smoker:  no  Health Maintenance: Pap: 12-20-14 Neg:Neg HR HPV, 08-29-11 Neg:Neg HR HPV History of abnormal Pap:  Yes, Hx cryotherapy to cervix years ago. Final surgical pathology of cervix--benign. MMG:  See Epic.  Mammogram due in March 2022.  Colonoscopy: 03-22-16 Normal;next due 02/2026 BMD:  10-14-19 Result : Normal TDaP:  12-13-16 Gardasil:   no HIV:01-10-16 NR Hep C:no Screening Labs:  Today.    reports that she has never smoked. She has never used smokeless tobacco. She reports that she does not drink alcohol and does not use drugs.  Past Medical History:  Diagnosis Date  . Arthritis   . Breast cancer ([place]) 11/02/2019   right breast  . Cancer ([place]) 03/2013, 06/2013   malignant melanoma on right arm and back  . Complication of anesthesia    Per patient after hysterectomy in 2016, had to stay over an extra night due to low oxygen levels  . Dyspareunia   . Elevated serum creatinine   . Family history of colon cancer   . Family history of lung cancer   . Family history of melanoma   . Family history of pancreatic cancer   . Family history of stomach cancer   . Family history of uterine cancer   . Fibroid   . GERD (gastroesophageal reflux  disease)    takes protonix as needed   . Hypothyroidism   . 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 Tennis elbow 2017   Right  . Tinnitus 2020   both ears    Past Surgical History:  Procedure Laterality Date  . ADENOIDECTOMY     per patient as a child  . BILATERAL SALPINGECTOMY Bilateral 02/14/2015   Procedure: BILATERAL SALPINGECTOMY OOPHORECTOMY;  Surgeon: Nunzio Cobbs, MD;  Location: [facility];  Service: Gynecology;  Laterality: Bilateral;  . BREAST BIOPSY  1987   Dr. Rosana Hoes  . BREAST EXCISIONAL BIOPSY Left    benign  . BREAST LUMPECTOMY WITH RADIOACTIVE SEED AND SENTINEL LYMPH NODE BIOPSY Right 12/23/2019   Procedure: RIGHT BREAST LUMPECTOMY X 3 WITH RADIOACTIVE SEED AND SENTINEL LYMPH NODE MAPPING;  Surgeon: Erroll Luna, MD;  Location: [facility];  Service: General;  Laterality: Right;  . BREAST SURGERY Right 04/2010   breast biopsy for multiple fibroadenoma - no atypia  . CATARACT EXTRACTION, BILATERAL    . CESAREAN SECTION    . CRYOTHERAPY  5/91   for persistent condyloma atypia  . CYSTOSCOPY N/A 02/14/2015   Procedure: CYSTOSCOPY;  Surgeon: Nunzio Cobbs, MD;  Location: [facility];  Service: Gynecology;  Laterality: N/A;  . LASER ABLATION OF CONDYLOMAS  03/05/90 Dr. 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 MELANOMA EXCISION Right 03/2013   melanoma right arm  . ROBOTIC ASSISTED TOTAL HYSTERECTOMY N/A 02/14/2015   Procedure: ROBOTIC ASSISTED TOTAL HYSTERECTOMY;  Surgeon: Nunzio Cobbs, MD;  Location: [facility];  Service: Gynecology;  Laterality: N/A;  . SKIN CANCER EXCISION  06/2013   midddle of back with path report of atypia  . TUBAL LIGATION Bilateral   . WISDOM TOOTH EXTRACTION      Current Outpatient Medications  Medication Sig Dispense Refill  . gabapentin (NEURONTIN) 300 MG capsule Take 1 capsule (300 mg total) by mouth at bedtime. 90 capsule 4  . levothyroxine (SYNTHROID) 50 MCG tablet TAKE 1 TABLET BY MOUTH  EVERY DAY 90 tablet 1  . tamoxifen (NOLVADEX) 20 MG tablet Take 20 mg by mouth daily.    Marland Kitchen tretinoin (RETIN-A) 0.025 % cream APPLY A PEA SIZE AMOUNT TO AFFECTED AREAS NIGHTLY.     No current facility-administered medications for this visit.    Family History  Problem Relation Age of Onset  . Cancer Mother        lung and liver  . Alcohol abuse Father        Cirrhosis of liver  . Cancer Maternal Aunt        stomach cancer  . Cancer Maternal Grandmother        uterine cancer  . Heart failure Paternal Grandmother   . Lung cancer Maternal Uncle   . Lung cancer Maternal Uncle   . Pancreatic cancer Paternal Uncle   . Testicular cancer Cousin   . Melanoma Cousin        of eye  . Cancer Cousin        possible colon? dx 37s  . Colon cancer Other   . Breast cancer Cousin     Review of Systems  Genitourinary: Positive for frequency.  All other systems reviewed and are negative.   Exam:   BP 128/70   Pulse 66   Resp 16   Ht 5' 4.5" (1.638 m)   Wt 178 lb 9.6 oz (81 kg)   LMP 10/15/2014 (Exact Date)   BMI 30.18 kg/m     General appearance: alert, cooperative and appears stated age Head: normocephalic, without obvious abnormality, atraumatic Neck: no adenopathy, supple, symmetrical, trachea midline and thyroid normal to inspection and palpation Lungs: clear to auscultation bilaterally Breasts: left - minor ecchymoses at 4:00, no masses or tenderness, No nipple retraction or dimpling, No nipple discharge or bleeding, No axillary adenopathy Right - radiation changes and edema nipple and right side at 3:00.  No nipple discharge. No axillary adenopathy.  Heart: regular rate and rhythm Abdomen: soft, non-tender; no masses, no organomegaly Extremities: extremities normal, atraumatic, no cyanosis or edema Skin: skin color, texture, turgor normal. No rashes or lesions Lymph nodes: cervical, supraclavicular, and axillary nodes normal. Neurologic: grossly normal  Pelvic: External  genitalia:  no lesions              No abnormal inguinal nodes palpated.              Urethra:  normal appearing urethra with no masses, tenderness or lesions              Bartholins and Skenes: normal                 Vagina: normal appearing vagina with normal color  and discharge, no lesions              Cervix: absent              Pap taken: No. Bimanual Exam:  Uterus: absent              Adnexa: no mass, fullness, tenderness              Rectal exam: Yes.  .  Confirms.              Anus:  normal sphincter tone, no lesions  Chaperone was present for exam.  Assessment:   Well woman visit with normal exam. Status post robotic hysterectomy and BSO. Hx premature ovarian failure since age 69.  Right breast cancer.  Status post lumpectomy and XRT.  On Tamoxifen.  Negative genetic testing.  Hx melanoma. Hx hypothyroidism. On Synthroid. PCP prescribing. Elevated LDL cholesterol. Urinary frequency.  Abnormal urine dip.    Plan: Mammogram screening discussed. Self breast awareness reviewed. Pap and HR HPV as above. Guidelines for Calcium, Vitamin D, regular exercise program including cardiovascular and weight bearing exercise. Urine micro and culture.  No abx at this time.  Will check lipids and thyroid function.  We discussed Covid and flu vaccine.  Follow up annually and prn.    After visit summary provided.

## 2020-05-02 LAB — URINALYSIS, MICROSCOPIC ONLY
Casts: NONE SEEN /lpf
Epithelial Cells (non renal): >10 /hpf — AB (ref 0–10)
RBC, Urine: NONE SEEN /hpf (ref 0–2)

## 2020-05-02 LAB — LIPID PANEL
Chol/HDL Ratio: 3.9 ratio (ref 0.0–4.4)
Cholesterol, Total: 213 mg/dL — ABNORMAL HIGH (ref 100–199)
HDL: 54 mg/dL (ref >39)
LDL Chol Calc (NIH): 137 mg/dL — ABNORMAL HIGH (ref 0–99)
Triglycerides: 123 mg/dL (ref 0–149)
VLDL Cholesterol Cal: 22 mg/dL (ref 5–40)

## 2020-05-02 LAB — TSH: TSH: 2.25 u[IU]/mL (ref 0.450–4.500)

## 2020-05-02 LAB — T4, FREE: Free T4: 1.07 ng/dL (ref 0.82–1.77)

## 2020-05-03 LAB — URINE CULTURE: Organism ID, Bacteria: NO GROWTH

## 2020-05-11 ENCOUNTER — Other Ambulatory Visit: Payer: Self-pay

## 2020-05-11 ENCOUNTER — Ambulatory Visit: Payer: BC Managed Care – PPO | Admitting: Rehabilitation

## 2020-05-11 ENCOUNTER — Encounter: Payer: Self-pay | Admitting: Rehabilitation

## 2020-05-11 DIAGNOSIS — R600 Localized edema: Secondary | ICD-10-CM

## 2020-05-11 DIAGNOSIS — Z483 Aftercare following surgery for neoplasm: Secondary | ICD-10-CM | POA: Diagnosis not present

## 2020-05-11 DIAGNOSIS — Z9889 Other specified postprocedural states: Secondary | ICD-10-CM

## 2020-05-11 DIAGNOSIS — M25611 Stiffness of right shoulder, not elsewhere classified: Secondary | ICD-10-CM

## 2020-05-11 NOTE — Therapy (Signed)
[facility] [address] [place], [place], [address] Phone: [phone]   Fax:  [phone]  Physical Therapy Treatment  Patient Details  Name: Alexis Young MRN: [id number] Date of Birth: 02-08-1966 Referring Provider (PT): Dr. Luisa Hart   Encounter Date: 05/11/2020   PT End of Session - 05/11/20 0953    Visit Number 6    Number of Visits 10    Date for PT Re-Evaluation 07/06/20    PT Start Time 0900    PT Stop Time 0953    PT Time Calculation (min) 53 min    Activity Tolerance Patient tolerated treatment well    Behavior During Therapy [facility] for tasks assessed/performed           Past Medical History:  Diagnosis Date  . Arthritis   . Breast cancer ([facility]) 11/02/2019   right breast  . Cancer ([facility]) 03/2013, 06/2013   malignant melanoma on right arm and back  . Complication of anesthesia    Per patient after hysterectomy in 2016, had to stay over an extra night due to low oxygen levels  . Dyspareunia   . Elevated serum creatinine   . Family history of colon cancer   . Family history of lung cancer   . Family history of melanoma   . Family history of pancreatic cancer   . Family history of stomach cancer   . Family history of uterine cancer   . Fibroid   . GERD (gastroesophageal reflux disease)    takes protonix as needed   . Hypothyroidism   . 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 Tennis elbow 2017   Right  . Tinnitus 2020   both ears    Past Surgical History:  Procedure Laterality Date  . ADENOIDECTOMY     per patient as a child  . BILATERAL SALPINGECTOMY Bilateral 02/14/2015   Procedure: BILATERAL SALPINGECTOMY OOPHORECTOMY;  Surgeon: Patton Salles, MD;  Location: [facility];  Service: Gynecology;  Laterality: Bilateral;  . BREAST BIOPSY  1987   Dr. Earlene Plater  . BREAST EXCISIONAL BIOPSY Left    benign  . BREAST LUMPECTOMY WITH RADIOACTIVE SEED AND SENTINEL LYMPH NODE  BIOPSY Right 12/23/2019   Procedure: RIGHT BREAST LUMPECTOMY X 3 WITH RADIOACTIVE SEED AND SENTINEL LYMPH NODE MAPPING;  Surgeon: Harriette Bouillon, MD;  Location: [facility];  Service: General;  Laterality: Right;  . BREAST SURGERY Right 04/2010   breast biopsy for multiple fibroadenoma - no atypia  . CATARACT EXTRACTION, BILATERAL    . CESAREAN SECTION    . CRYOTHERAPY  5/91   for persistent condyloma atypia  . CYSTOSCOPY N/A 02/14/2015   Procedure: CYSTOSCOPY;  Surgeon: Patton Salles, MD;  Location: [facility];  Service: Gynecology;  Laterality: N/A;  . LASER ABLATION OF CONDYLOMAS  03/05/90 Dr. Randell Patient   perineal area- also had Bowens disease  . MELANOMA EXCISION Right 03/2013   melanoma right arm  . ROBOTIC ASSISTED TOTAL HYSTERECTOMY N/A 02/14/2015   Procedure: ROBOTIC ASSISTED TOTAL HYSTERECTOMY;  Surgeon: Patton Salles, MD;  Location: [facility];  Service: Gynecology;  Laterality: N/A;  . SKIN CANCER EXCISION  06/2013   midddle of back with path report of atypia  . TUBAL LIGATION Bilateral   . WISDOM TOOTH EXTRACTION      There were no vitals filed for this visit.   Subjective Assessment - 05/11/20 0900    Subjective I just  don't like the bra    Pertinent History Right lumpectomy x 3 with SLNB of 2 nodes removed both negative on 12/23/19 by Dr. Brantley Stage.  ER/PR positive, HER2negative.  [place].  Radiation started yesterday.  History of hysterectomy.    Currently in Pain? No/denies                             [facility] Adult PT Treatment/Exercise - 05/11/20 0001      Manual Therapy   Manual Therapy Passive ROM    Edema Management gave pt handout on swell spot to order with regular sports bra as she has had bad luck with compresison bra fit    Soft tissue mobilization to scar tissue around incision with deeper work to lateral breast area surrounding incision    Manual Lymphatic Drainage (MLD) In Supine: Short neck, superficial and deep abdominals, Rt inguinal and Lt  axillary nodes, Rt axillo-inguinal and anterior inter-axillary anastomosis, then forcused on Rt breast, then into Lt S/L for further work to lateral breast redirecting fluid towards Rt axillo-inguinal and posterior inter-axillary anastomosis, finished in supine retracing all steps to nodes reviewing with pt throughout.    Passive ROM to the right shoulder with axillary blocking                       PT Long Term Goals - 05/11/20 0947      PT LONG TERM GOAL #1   Title Pt will improve Rt shoulder flexion to at least 156 degrees to equal the well arm    Baseline 142, 157    Status Achieved      PT LONG TERM GOAL #2   Title pt will be ind with stretch ABC or similar for continued strength and mobility and lymphedema risk reduction    Status On-going      PT LONG TERM GOAL #3   Title Pt will report decrease in Rt breast fibrosis and heaviness by at least 50%    Baseline decreases after therapy but not lasting more than 3 days    Status Partially Met                 Plan - 05/11/20 0954    Clinical Impression Statement pt is still having trouble finding a compression bra that fits well as the extender did not make it any more comfortable.  Pt will try swell spot with regular sports bra instead.  Still continues with overall Rt breast fibrosis especially laterally decreasing with STM and PT.  Goals were assessed today which are progressing but not all met.  Will see pt again in 2 weeks to see if swell spot is helping and to assess more appt needs.    PT Frequency 1x / week    PT Duration 8 weeks    PT Treatment/Interventions ADLs/Self Care Home Management;Patient/family education;Passive range of motion;Manual lymph drainage;Manual techniques;Scar mobilization    PT Next Visit Plan how was swell spot? continue and review Rt breast MLD; scar tissue release/fibrosis release    Consulted and Agree with Plan of Care Patient           Patient will benefit from skilled  therapeutic intervention in order to improve the following deficits and impairments:     Visit Diagnosis: Aftercare following surgery for neoplasm  H/O lumpectomy  Localized edema  Stiffness of right shoulder, not elsewhere classified     Problem List Patient  Active Problem List   Diagnosis Date Noted  . Genetic testing 11/19/2019  . Family history of stomach cancer   . Family history of uterine cancer   . Family history of colon cancer   . Family history of melanoma   . Family history of pancreatic cancer   . Family history of lung cancer   . Malignant neoplasm of upper-outer quadrant of right breast in female, estrogen receptor positive ([place]) 11/04/2019  . Sleep difficulties 06/29/2019  . 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 Tinnitus of right ear 03/12/2019  . Dysfunction of right eustachian tube 06/17/2018  . Hyperlipidemia 01/15/2018  . Prediabetes 01/15/2018  . Palpitations 01/15/2018  . Hypothyroidism 07/31/2016  . Status post laparoscopic hysterectomy 02/14/2015  . Fatigue 05/03/2014  . Premature ovarian failure 09/21/2013    Stark Bray 05/11/2020, 9:56 AM  [place], [place], [address] Phone: [phone]   Fax:  [phone]  Name: Alexis Young MRN: [id number] Date of Birth: 1965-10-12

## 2020-05-26 ENCOUNTER — Encounter: Payer: Self-pay | Admitting: Rehabilitation

## 2020-05-26 ENCOUNTER — Other Ambulatory Visit: Payer: Self-pay

## 2020-05-26 ENCOUNTER — Ambulatory Visit: Payer: BC Managed Care – PPO | Admitting: Rehabilitation

## 2020-05-26 DIAGNOSIS — M25611 Stiffness of right shoulder, not elsewhere classified: Secondary | ICD-10-CM

## 2020-05-26 DIAGNOSIS — Z483 Aftercare following surgery for neoplasm: Secondary | ICD-10-CM | POA: Diagnosis not present

## 2020-05-26 DIAGNOSIS — Z9889 Other specified postprocedural states: Secondary | ICD-10-CM

## 2020-05-26 DIAGNOSIS — R600 Localized edema: Secondary | ICD-10-CM

## 2020-05-26 NOTE — Therapy (Signed)
[facility] [address] [place], [place], [address] Phone: [phone]   Fax:  [phone]  Physical Therapy Treatment  Patient Details  Name: Alexis Young MRN: [id number] Date of Birth: 02-04-1966 Referring Provider (PT): Dr. Luisa Hart   Encounter Date: 05/26/2020   PT End of Session - 05/26/20 0948    Visit Number 7    Date for PT Re-Evaluation 07/06/20    PT Start Time 0900    PT Stop Time 0948    PT Time Calculation (min) 48 min    Activity Tolerance Patient tolerated treatment well    Behavior During Therapy [facility] for tasks assessed/performed           Past Medical History:  Diagnosis Date  . Arthritis   . Breast cancer ([facility]) 11/02/2019   right breast  . Cancer ([facility]) 03/2013, 06/2013   malignant melanoma on right arm and back  . Complication of anesthesia    Per patient after hysterectomy in 2016, had to stay over an extra night due to low oxygen levels  . Dyspareunia   . Elevated serum creatinine   . Family history of colon cancer   . Family history of lung cancer   . Family history of melanoma   . Family history of pancreatic cancer   . Family history of stomach cancer   . Family history of uterine cancer   . Fibroid   . GERD (gastroesophageal reflux disease)    takes protonix as needed   . Hypothyroidism   . 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 Tennis elbow 2017   Right  . Tinnitus 2020   both ears    Past Surgical History:  Procedure Laterality Date  . ADENOIDECTOMY     per patient as a child  . BILATERAL SALPINGECTOMY Bilateral 02/14/2015   Procedure: BILATERAL SALPINGECTOMY OOPHORECTOMY;  Surgeon: Patton Salles, MD;  Location: [facility];  Service: Gynecology;  Laterality: Bilateral;  . BREAST BIOPSY  1987   Dr. Earlene Plater  . BREAST EXCISIONAL BIOPSY Left    benign  . BREAST LUMPECTOMY WITH RADIOACTIVE SEED AND SENTINEL LYMPH NODE BIOPSY Right 12/23/2019    Procedure: RIGHT BREAST LUMPECTOMY X 3 WITH RADIOACTIVE SEED AND SENTINEL LYMPH NODE MAPPING;  Surgeon: Harriette Bouillon, MD;  Location: [facility];  Service: General;  Laterality: Right;  . BREAST SURGERY Right 04/2010   breast biopsy for multiple fibroadenoma - no atypia  . CATARACT EXTRACTION, BILATERAL    . CESAREAN SECTION    . CRYOTHERAPY  5/91   for persistent condyloma atypia  . CYSTOSCOPY N/A 02/14/2015   Procedure: CYSTOSCOPY;  Surgeon: Patton Salles, MD;  Location: [facility];  Service: Gynecology;  Laterality: N/A;  . LASER ABLATION OF CONDYLOMAS  03/05/90 Dr. Randell Patient   perineal area- also had Bowens disease  . MELANOMA EXCISION Right 03/2013   melanoma right arm  . ROBOTIC ASSISTED TOTAL HYSTERECTOMY N/A 02/14/2015   Procedure: ROBOTIC ASSISTED TOTAL HYSTERECTOMY;  Surgeon: Patton Salles, MD;  Location: [facility];  Service: Gynecology;  Laterality: N/A;  . SKIN CANCER EXCISION  06/2013   midddle of back with path report of atypia  . TUBAL LIGATION Bilateral   . WISDOM TOOTH EXTRACTION      There were no vitals filed for this visit.   Subjective Assessment - 05/26/20 0901    Subjective I am just using the other bra with the  swell spot    Pertinent History Right lumpectomy x 3 with SLNB of 2 nodes removed both negative on 12/23/19 by Dr. Brantley Stage.  ER/PR positive, HER2negative.  [place].  Radiation started yesterday.  History of hysterectomy.    Patient Stated Goals just do what I need to do    Currently in Pain? No/denies                             [facility] Adult PT Treatment/Exercise - 05/26/20 0001      Manual Therapy   Soft tissue mobilization to scar tissue around incision with deeper work to lateral breast area surrounding incision    Manual Lymphatic Drainage (MLD) In Supine: Short neck, superficial and deep abdominals, Rt inguinal and Lt axillary nodes, Rt axillo-inguinal and anterior inter-axillary anastomosis, then forcused on Rt breast, then into Lt  S/L for further work to lateral breast redirecting fluid towards Rt axillo-inguinal and posterior inter-axillary anastomosis, finished in supine retracing all steps to nodes reviewing with pt throughout.    Passive ROM to the right shoulder with axillary blocking                       PT Long Term Goals - 05/26/20 0950      PT LONG TERM GOAL #1   Title Pt will improve Rt shoulder flexion to at least 156 degrees to equal the well arm    Status Achieved      PT LONG TERM GOAL #2   Title pt will be ind with stretch ABC or similar for continued strength and mobility and lymphedema risk reduction    Baseline given strength ABC program    Status Achieved      PT LONG TERM GOAL #3   Title Pt will report decrease in Rt breast fibrosis and heaviness by at least 50%    Baseline decreases after therapy but not lasting more than 3 days    Status Partially Met                 Plan - 05/26/20 0949    Clinical Impression Statement Pt has been wearing the swell spot for awhile in the bra and the lateral fibrosis near the incision is still firm but less lumpy and tender.  Pt continues with overall breast edema especially by the end of the day despite compresoin, self MLD and scar work.  Will send information over to [facility] about a pump forthe breast edema.  Pt would benefit from this to decrease breast fibrosis and change of skin breakdown.    PT Frequency 1x / week    PT Duration 8 weeks    PT Treatment/Interventions ADLs/Self Care Home Management;Patient/family education;Passive range of motion;Manual lymph drainage;Manual techniques;Scar mobilization    PT Next Visit Plan how was a month break? Any pump info?  continue and review Rt breast MLD; scar tissue release/fibrosis release    Consulted and Agree with Plan of Care Patient           Patient will benefit from skilled therapeutic intervention in order to improve the following deficits and impairments:     Visit  Diagnosis: Aftercare following surgery for neoplasm  H/O lumpectomy  Localized edema  Stiffness of right shoulder, not elsewhere classified     Problem List Patient Active Problem List   Diagnosis Date Noted  . Genetic testing 11/19/2019  . Family history of stomach cancer   .  Family history of uterine cancer   . Family history of colon cancer   . Family history of melanoma   . Family history of pancreatic cancer   . Family history of lung cancer   . Malignant neoplasm of upper-outer quadrant of right breast in female, estrogen receptor positive ([place]) 11/04/2019  . Sleep difficulties 06/29/2019  . 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 Tinnitus of right ear 03/12/2019  . Dysfunction of right eustachian tube 06/17/2018  . Hyperlipidemia 01/15/2018  . Prediabetes 01/15/2018  . Palpitations 01/15/2018  . Hypothyroidism 07/31/2016  . Status post laparoscopic hysterectomy 02/14/2015  . Fatigue 05/03/2014  . Premature ovarian failure 09/21/2013   Shan Levans, PT 05/26/2020, 9:51 AM  [place], [place], [address] Phone: [phone]   Fax:  [phone]  Name: Alexis Young MRN: [id number] Date of Birth: 02/25/1966

## 2020-06-20 ENCOUNTER — Other Ambulatory Visit: Payer: Self-pay | Admitting: Internal Medicine

## 2020-06-28 NOTE — Patient Instructions (Addendum)
Blood work was ordered.     No immunization administered today.   Medications changes include :   none   Please followup in 1 year    Health Maintenance, Female Adopting a healthy lifestyle and getting preventive care are important in promoting health and wellness. Ask your health care provider about:  The right schedule for you to have regular tests and exams.  Things you can do on your own to prevent diseases and keep yourself healthy. What should I know about diet, weight, and exercise? Eat a healthy diet   Eat a diet that includes plenty of vegetables, fruits, low-fat dairy products, and lean protein.  Do not eat a lot of foods that are high in solid fats, added sugars, or sodium. Maintain a healthy weight Body mass index (BMI) is used to identify weight problems. It estimates body fat based on height and weight. Your health care provider can help determine your BMI and help you achieve or maintain a healthy weight. Get regular exercise Get regular exercise. This is one of the most important things you can do for your health. Most adults should:  Exercise for at least 150 minutes each week. The exercise should increase your heart rate and make you sweat (moderate-intensity exercise).  Do strengthening exercises at least twice a week. This is in addition to the moderate-intensity exercise.  Spend less time sitting. Even light physical activity can be beneficial. Watch cholesterol and blood lipids Have your blood tested for lipids and cholesterol at 55 years of age, then have this test every 5 years. Have your cholesterol levels checked more often if:  Your lipid or cholesterol levels are high.  You are older than 55 years of age.  You are at high risk for heart disease. What should I know about cancer screening? Depending on your health history and family history, you may need to have cancer screening at various ages. This may include screening for:  Breast  cancer.  Cervical cancer.  Colorectal cancer.  Skin cancer.  Lung cancer. What should I know about heart disease, diabetes, and high blood pressure? Blood pressure and heart disease  High blood pressure causes heart disease and increases the risk of stroke. This is more likely to develop in people who have high blood pressure readings, are of African descent, or are overweight.  Have your blood pressure checked: ? Every 3-5 years if you are 21-55 years of age. ? Every year if you are 35 years old or older. Diabetes Have regular diabetes screenings. This checks your fasting blood sugar level. Have the screening done:  Once every three years after age 13 if you are at a normal weight and have a low risk for diabetes.  More often and at a younger age if you are overweight or have a high risk for diabetes. What should I know about preventing infection? Hepatitis B If you have a higher risk for hepatitis B, you should be screened for this virus. Talk with your health care provider to find out if you are at risk for hepatitis B infection. Hepatitis C Testing is recommended for:  Everyone born from [birth year] through 1966.  Anyone with known risk factors for hepatitis C. Sexually transmitted infections (STIs)  Get screened for STIs, including gonorrhea and chlamydia, if: ? You are sexually active and are younger than 55 years of age. ? You are older than 55 years of age and your health care provider tells you that you are at risk for  this type of infection. ? Your sexual activity has changed since you were last screened, and you are at increased risk for chlamydia or gonorrhea. Ask your health care provider if you are at risk.  Ask your health care provider about whether you are at high risk for HIV. Your health care provider may recommend a prescription medicine to help prevent HIV infection. If you choose to take medicine to prevent HIV, you should first get tested for HIV. You should  then be tested every 3 months for as long as you are taking the medicine. Pregnancy  If you are about to stop having your period (premenopausal) and you may become pregnant, seek counseling before you get pregnant.  Take 400 to 800 micrograms (mcg) of folic acid every day if you become pregnant.  Ask for birth control (contraception) if you want to prevent pregnancy. Osteoporosis and menopause Osteoporosis is a disease in which the bones lose minerals and strength with aging. This can result in bone fractures. If you are 55 years old or older, or if you are at risk for osteoporosis and fractures, ask your health care provider if you should:  Be screened for bone loss.  Take a calcium or vitamin D supplement to lower your risk of fractures.  Be given hormone replacement therapy (HRT) to treat symptoms of menopause. Follow these instructions at home: Lifestyle  Do not use any products that contain nicotine or tobacco, such as cigarettes, e-cigarettes, and chewing tobacco. If you need help quitting, ask your health care provider.  Do not use street drugs.  Do not share needles.  Ask your health care provider for help if you need support or information about quitting drugs. Alcohol use  Do not drink alcohol if: ? Your health care provider tells you not to drink. ? You are pregnant, may be pregnant, or are planning to become pregnant.  If you drink alcohol: ? Limit how much you use to 0-1 drink a day. ? Limit intake if you are breastfeeding.  Be aware of how much alcohol is in your drink. In the [place] one drink equals one 12 oz bottle of beer (355 mL), one 5 oz glass of wine (148 mL), or one 1 oz glass of hard liquor (44 mL). General instructions  Schedule regular health, dental, and eye exams.  Stay current with your vaccines.  Tell your health care provider if: ? You often feel depressed. ? You have ever been abused or do not feel safe at home. Summary  Adopting a  healthy lifestyle and getting preventive care are important in promoting health and wellness.  Follow your health care provider's instructions about healthy diet, exercising, and getting tested or screened for diseases.  Follow your health care provider's instructions on monitoring your cholesterol and blood pressure. This information is not intended to replace advice given to you by your health care provider. Make sure you discuss any questions you have with your health care provider. Document Revised: 07/08/2018 Document Reviewed: 07/08/2018 Elsevier Patient Education  2020 [employer].

## 2020-06-28 NOTE — Progress Notes (Signed)
Subjective:    Patient ID: Alexis Young, female    DOB: 03/20/1966, 54 y.o.   MRN: [id number]   This visit occurred during the SARS-CoV-2 public health emergency.  Safety protocols were in place, including screening questions prior to the visit, additional usage of staff PPE, and extensive cleaning of exam room while observing appropriate contact time as indicated for disinfecting solutions.    HPI She is here for a physical exam.   She was diagnosed with breast cancer since she was here last. She is doing ok with everything.  She is tolerating the tamoxifen ok.  She does have hot flashes and is taking gabapentin at night.   Medications and allergies reviewed with patient and updated if appropriate.  Patient Active Problem List   Diagnosis Date Noted  . Genetic testing 11/19/2019  . Family history of stomach cancer   . Family history of uterine cancer   . Family history of colon cancer   . Family history of melanoma   . Family history of pancreatic cancer   . Family history of lung cancer   . Malignant neoplasm of upper-outer quadrant of right breast in female, estrogen receptor positive ([place]) 11/04/2019  . Sleep difficulties 06/29/2019  . 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 Tinnitus of right ear 03/12/2019  . Dysfunction of right eustachian tube 06/17/2018  . Hyperlipidemia 01/15/2018  . Prediabetes 01/15/2018  . Palpitations 01/15/2018  . Hypothyroidism 07/31/2016  . Status post laparoscopic hysterectomy 02/14/2015  . Fatigue 05/03/2014  . 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 gabapentin (NEURONTIN) 300 MG capsule Take 1 capsule (300 mg total) by mouth at bedtime. 90 capsule 4  . levothyroxine (SYNTHROID) 50 MCG tablet TAKE 1 TABLET BY MOUTH EVERY DAY 90 tablet 1  . tamoxifen (NOLVADEX) 20 MG tablet Take 20 mg by  mouth daily.    Marland Kitchen tretinoin (RETIN-A) 0.025 % cream APPLY A PEA SIZE AMOUNT TO AFFECTED AREAS NIGHTLY.     No current facility-administered medications on file prior to visit.    Past Medical History:  Diagnosis Date  . Arthritis   . Breast cancer ([place]) 11/02/2019   right breast  . 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 Complication of anesthesia    Per patient after hysterectomy in 2016, had to stay over an extra night due to low oxygen levels  . Dyspareunia   . Elevated serum creatinine   . Family history of colon cancer   . Family history of lung cancer   . Family history of melanoma   . Family history of pancreatic cancer   . Family history of stomach cancer   . Family history of uterine cancer   . Fibroid   . GERD (gastroesophageal reflux disease)    takes protonix as needed   . Hypothyroidism   . 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 Tennis elbow 2017   Right  . Tinnitus 2020   both ears    Past Surgical History:  Procedure Laterality Date  . ADENOIDECTOMY     per patient as a child  . BILATERAL SALPINGECTOMY Bilateral 02/14/2015   Procedure: BILATERAL SALPINGECTOMY OOPHORECTOMY;  Surgeon: Nunzio Cobbs, MD;  Location: [facility];  Service: Gynecology;  Laterality: Bilateral;  .  BREAST BIOPSY  1987   Dr. Rosana Hoes  . BREAST EXCISIONAL BIOPSY Left    benign  . BREAST LUMPECTOMY WITH RADIOACTIVE SEED AND SENTINEL LYMPH NODE BIOPSY Right 12/23/2019   Procedure: RIGHT BREAST LUMPECTOMY X 3 WITH RADIOACTIVE SEED AND SENTINEL LYMPH NODE MAPPING;  Surgeon: Erroll Luna, MD;  Location: [facility];  Service: General;  Laterality: Right;  . BREAST SURGERY Right 04/2010   breast biopsy for multiple fibroadenoma - no atypia  . CATARACT EXTRACTION, BILATERAL    . CESAREAN SECTION    . CRYOTHERAPY  5/91   for persistent condyloma atypia  . CYSTOSCOPY N/A 02/14/2015   Procedure: CYSTOSCOPY;  Surgeon: Nunzio Cobbs, MD;  Location: [facility];  Service: Gynecology;  Laterality: N/A;  . LASER ABLATION OF CONDYLOMAS  03/05/90 Dr. 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 MELANOMA EXCISION Right 03/2013   melanoma right arm  . ROBOTIC ASSISTED TOTAL HYSTERECTOMY N/A 02/14/2015   Procedure: ROBOTIC ASSISTED TOTAL HYSTERECTOMY;  Surgeon: Nunzio Cobbs, MD;  Location: [facility];  Service: Gynecology;  Laterality: N/A;  . SKIN CANCER EXCISION  06/2013   midddle of back with path report of atypia  . TUBAL LIGATION Bilateral   . WISDOM TOOTH EXTRACTION      Social History   Socioeconomic History  . Marital status: Married    Spouse name: Not on file  . Number of children: 1  . Years of education: Not on file  . Highest education level: Not on file  Occupational History    Employer: [employer]  Tobacco Use  . Smoking status: Never Smoker  . Smokeless tobacco: Never Used  Vaping Use  . Vaping Use: Never used  Substance and Sexual Activity  . Alcohol use: No  . Drug use: No  . Sexual activity: Yes    Partners: Male    Birth control/protection: Surgical    Comment: Hysterectomy  Other Topics Concern  . Not on file  Social History Narrative  . Not on file   Social Determinants of Health   Financial Resource Strain:   . Difficulty of Paying Living Expenses: Not on file  Food Insecurity:   . Worried About Charity fundraiser in the Last Year: Not on file  . Ran Out of Food in the Last Year: Not on file  Transportation Needs:   . Lack of Transportation (Medical): Not on file  . Lack of Transportation (Non-Medical): Not on file  Physical Activity:   . Days of Exercise per Week: Not on file  . Minutes of Exercise per Session: Not on file  Stress:   . Feeling of Stress : Not on file  Social Connections:   . Frequency of Communication with Friends and Family: Not on file  . Frequency of Social Gatherings with Friends and Family: Not on file  . Attends  Religious Services: Not on file  . Active Member of Clubs or Organizations: Not on file  . Attends Archivist Meetings: Not on file  . Marital Status: Not on file    Family History  Problem Relation Age of Onset  . Cancer Mother        lung and liver  . Alcohol abuse Father        Cirrhosis of liver  . Cancer Maternal Aunt        stomach cancer  . Cancer Maternal Grandmother        uterine  cancer  . Heart failure Paternal Grandmother   . Lung cancer Maternal Uncle   . Lung cancer Maternal Uncle   . Pancreatic cancer Paternal Uncle   . Testicular cancer Cousin   . Melanoma Cousin        of eye  . Cancer Cousin        possible colon? dx 71s  . Colon cancer Other   . Breast cancer Cousin     Review of Systems  Constitutional: Negative for appetite change and fever.       Hot flashes  Eyes: Negative for visual disturbance.  Respiratory: Negative for cough, shortness of breath and wheezing.   Cardiovascular: Negative for chest pain, palpitations and leg swelling.  Gastrointestinal: Negative for abdominal pain, blood in stool, constipation, diarrhea and nausea.       No gerd  Genitourinary: Positive for frequency. Negative for dysuria and hematuria.  Musculoskeletal: Negative for arthralgias and back pain.  Skin: Negative for color change and rash.  Neurological: Negative for light-headedness and headaches.  Psychiatric/Behavioral: Negative for dysphoric mood. The patient is nervous/anxious.        Objective:   Vitals:   06/29/20 0802  BP: 126/70  Pulse: 71  Temp: 98 F (36.7 C)  SpO2: 98%   Filed Weights   06/29/20 0802  Weight: 168 lb 3.2 oz (76.3 kg)   Body mass index is 28.43 kg/m.  BP Readings from Last 3 Encounters:  06/29/20 126/70  05/01/20 128/70  03/28/20 (!) 143/69    Wt Readings from Last 3 Encounters:  06/29/20 168 lb 3.2 oz (76.3 kg)  05/01/20 178 lb 9.6 oz (81 kg)  03/28/20 180 lb 12.8 oz (82 kg)     Physical  Exam Constitutional: She appears well-developed and well-nourished. No distress.  HENT:  Head: Normocephalic and atraumatic.  Right Ear: External ear normal. Normal ear canal and TM Left Ear: External ear normal.  Normal ear canal and TM Mouth/Throat: Oropharynx is clear and moist.  Eyes: Conjunctivae and EOM are normal.  Neck: Neck supple. No tracheal deviation present. No thyromegaly present.  No carotid bruit  Cardiovascular: Normal rate, regular rhythm and normal heart sounds.   No murmur heard.  No edema. Pulmonary/Chest: Effort normal and breath sounds normal. No respiratory distress. She has no wheezes. She has no rales.  Breast: deferred   Abdominal: Soft. She exhibits no distension. There is no tenderness.  Lymphadenopathy: She has no cervical adenopathy.  Skin: Skin is warm and dry. She is not diaphoretic.  Psychiatric: She has a normal mood and affect. Her behavior is normal.        Assessment & Plan:   Physical exam: Screening blood work    ordered Immunizations flu vaccine deferred, not getting COVID vaccines, discussed Shingrix Colonoscopy up-to-date Mammogram up-to-date Gyn  Up to date  Dexa up-to-date Eye exams  Up to date  Exercise  walking Weight  Has lost weight - decreased portions and eating better Substance abuse none      See Problem List for Assessment and Plan of chronic medical problems.

## 2020-06-29 ENCOUNTER — Other Ambulatory Visit: Payer: Self-pay

## 2020-06-29 ENCOUNTER — Encounter: Payer: Self-pay | Admitting: Internal Medicine

## 2020-06-29 ENCOUNTER — Ambulatory Visit (INDEPENDENT_AMBULATORY_CARE_PROVIDER_SITE_OTHER): Payer: BC Managed Care – PPO | Admitting: Internal Medicine

## 2020-06-29 VITALS — BP 126/70 | HR 71 | Temp 98.0°F | Ht 64.5 in | Wt 168.2 lb

## 2020-06-29 DIAGNOSIS — E039 Hypothyroidism, unspecified: Secondary | ICD-10-CM

## 2020-06-29 DIAGNOSIS — E782 Mixed hyperlipidemia: Secondary | ICD-10-CM | POA: Diagnosis not present

## 2020-06-29 DIAGNOSIS — Z0000 Encounter for general adult medical examination without abnormal findings: Secondary | ICD-10-CM | POA: Diagnosis not present

## 2020-06-29 DIAGNOSIS — R7303 Prediabetes: Secondary | ICD-10-CM

## 2020-06-29 DIAGNOSIS — Z170 Estrogen receptor positive status [ER+]: Secondary | ICD-10-CM

## 2020-06-29 DIAGNOSIS — C50411 Malignant neoplasm of upper-outer quadrant of right female breast: Secondary | ICD-10-CM

## 2020-06-29 LAB — CBC WITH DIFFERENTIAL/PLATELET
Basophils Absolute: 0 10*3/uL (ref 0.0–0.1)
Basophils Relative: 1 % (ref 0.0–3.0)
Eosinophils Absolute: 0.1 10*3/uL (ref 0.0–0.7)
Eosinophils Relative: 2.7 % (ref 0.0–5.0)
HCT: 38.5 % (ref 36.0–46.0)
Hemoglobin: 12.9 g/dL (ref 12.0–15.0)
Lymphocytes Relative: 32.2 % (ref 12.0–46.0)
Lymphs Abs: 1.1 10*3/uL (ref 0.7–4.0)
MCHC: 33.5 g/dL (ref 30.0–36.0)
MCV: 85.4 fl (ref 78.0–100.0)
Monocytes Absolute: 0.2 10*3/uL (ref 0.1–1.0)
Monocytes Relative: 7.5 % (ref 3.0–12.0)
Neutro Abs: 1.9 10*3/uL (ref 1.4–7.7)
Neutrophils Relative %: 56.6 % (ref 43.0–77.0)
Platelets: 167 10*3/uL (ref 150.0–400.0)
RBC: 4.51 Mil/uL (ref 3.87–5.11)
RDW: 13.7 % (ref 11.5–15.5)
WBC: 3.3 10*3/uL — ABNORMAL LOW (ref 4.0–10.5)

## 2020-06-29 LAB — COMPREHENSIVE METABOLIC PANEL
ALT: 11 U/L (ref 0–35)
AST: 17 U/L (ref 0–37)
Albumin: 4.3 g/dL (ref 3.5–5.2)
Alkaline Phosphatase: 49 U/L (ref 39–117)
BUN: 10 mg/dL (ref 6–23)
CO2: 28 mEq/L (ref 19–32)
Calcium: 9 mg/dL (ref 8.4–10.5)
Chloride: 107 mEq/L (ref 96–112)
Creatinine, Ser: 0.93 mg/dL (ref 0.40–1.20)
GFR: 69.2 mL/min (ref >60.00)
Glucose, Bld: 88 mg/dL (ref 70–99)
Potassium: 3.9 mEq/L (ref 3.5–5.1)
Sodium: 141 mEq/L (ref 135–145)
Total Bilirubin: 0.7 mg/dL (ref 0.2–1.2)
Total Protein: 7.1 g/dL (ref 6.0–8.3)

## 2020-06-29 LAB — LIPID PANEL
Cholesterol: 197 mg/dL (ref 0–200)
HDL: 59.9 mg/dL (ref >39.00)
LDL Cholesterol: 114 mg/dL — ABNORMAL HIGH (ref 0–99)
NonHDL: 137.26
Total CHOL/HDL Ratio: 3
Triglycerides: 115 mg/dL (ref 0.0–149.0)
VLDL: 23 mg/dL (ref 0.0–40.0)

## 2020-06-29 LAB — HEMOGLOBIN A1C: Hgb A1c MFr Bld: 5.5 % (ref 4.6–6.5)

## 2020-06-29 LAB — TSH: TSH: 1.71 u[IU]/mL (ref 0.35–4.50)

## 2020-06-29 NOTE — Assessment & Plan Note (Signed)
Chronic Check lipid panel  Continue weight loss efforts Regular exercise and healthy diet encouraged

## 2020-06-29 NOTE — Assessment & Plan Note (Signed)
Chronic  Clinically euthyroid Currently taking levothyroxine 50 mcg daily Check tsh  Titrate med dose if needed  

## 2020-06-29 NOTE — Assessment & Plan Note (Signed)
Chronic Check a1c Low sugar / carb diet Stressed regular exercise  

## 2020-06-29 NOTE — Assessment & Plan Note (Signed)
Chronic Following with Dr Jana Hakim On tamoxifen and gabapentin for the associated hot flases

## 2020-06-30 ENCOUNTER — Ambulatory Visit: Payer: BC Managed Care – PPO | Admitting: Rehabilitation

## 2020-07-02 NOTE — Progress Notes (Signed)
[place]  Telephone:([phone]) [phone] Fax:(336) [phone]     ID: Alexis Young DOB: 10-Feb-1966  MR#: [id number]  JYN#:[id number]  Patient Care Team: Binnie Rail, MD as PCP - General (Internal Medicine) Mauro Kaufmann, RN as Oncology Nurse Navigator Rockwell Germany, RN as Oncology Nurse Navigator Yesenia Locurto, Virgie Dad, MD as Consulting Physician (Oncology) Gery Pray, MD as Consulting Physician (Radiation Oncology) Erroll Luna, MD as Consulting Physician (General Surgery) Yisroel Ramming, Everardo All, MD as Consulting Physician (Obstetrics and Gynecology) Garrel Ridgel, [place] as Consulting Physician (Podiatry) Harriett Sine, MD as Consulting Physician (Dermatology) Juanita Craver, MD as Consulting Physician (Gastroenterology) Chauncey Cruel, MD OTHER MD:  I connected with Alexis Young on 07/03/20 at  3:30 PM EST by video enabled telemedicine visit and verified that I am speaking with the correct person using two identifiers.   I discussed the limitations, risks, security and privacy concerns of performing an evaluation and management service by telemedicine and the availability of in-person appointments. I also discussed with the patient that there may be a patient responsible charge related to this service. The patient expressed understanding and agreed to proceed.   Other persons participating in the visit and their role in the encounter: none  Patient's location: home Provider's location: [place]: Estrogen receptor positive breast cancer  CURRENT TREATMENT: tamoxifen   INTERVAL HISTORY: Alexis Young was contacted today for follow up of her estrogen receptor positive breast cancer accompanied by her husband Alexis Young.   She was started on tamoxifen at her last visit on 03/28/2020.  She is tolerating this well.  She has some hot flashes but they are not overwhelming and she can deal with that she says.  Vaginal wetness is not  an issue.  Overall she is pleased with the medication.  Since her last visit, she underwent biopsy of a palpable abnormality evaluated with mammography on 03/23/2020. Pathology from the procedure ([id number]) showed resolving fat necrosis.   REVIEW OF SYSTEMS: Alexis Young tells me she is walking about 3 miles almost every day.  She is doing some exercises for lymphedema as well.  She is watching what she eats and has lost 10 pounds so far.  She is very pleased with that and indeed she should be.  Aside from that a detailed review of systems today was stable   COVID 19 VACCINATION STATUS: Not vaccinated as of December 2021   HISTORY OF CURRENT ILLNESS: From the original intake note:  Alexis Young had routine screening mammography on 10/14/2019 showing a possible abnormality in the bilateral breasts. She underwent bilateral diagnostic mammography with tomography and bilateral breast ultrasonography at [facility] on 10/27/2019 showing: breast density category C; small area of asymmetry with pronounced focal distortion in outer right breast with no ultrasound correlate; benign left breast cysts; no abnormal-appearing lymph nodes.  Accordingly on 11/01/2019 she proceeded to biopsy of the right breast area in question. The pathology from this procedure ([id number]) showed: invasive lobular carcinoma, grade 1, e-cadherin negative. Prognostic indicators significant for: estrogen receptor, 80% positive and progesterone receptor, 80% positive, both with strong staining intensity. Proliferation marker Ki67 at 2%. HER2 negative by immunohistochemistry (0).  The patient's subsequent history is as detailed below.   PAST MEDICAL HISTORY: Past Medical History:  Diagnosis Date  . Arthritis   . Breast cancer ([place]) 11/02/2019   right breast  . Cancer ([place]) 03/2013, 06/2013   malignant melanoma on right arm  and back  . Complication of anesthesia    Per patient after hysterectomy in 2016, had to stay over an  extra night due to low oxygen levels  . Dyspareunia   . Elevated serum creatinine   . Family history of colon cancer   . Family history of lung cancer   . Family history of melanoma   . Family history of pancreatic cancer   . Family history of stomach cancer   . Family history of uterine cancer   . Fibroid   . GERD (gastroesophageal reflux disease)    takes protonix as needed   . Hypothyroidism   . 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 Tennis elbow 2017   Right  . Tinnitus 2020   both ears    PAST SURGICAL HISTORY: Past Surgical History:  Procedure Laterality Date  . ADENOIDECTOMY     per patient as a child  . BILATERAL SALPINGECTOMY Bilateral 02/14/2015   Procedure: BILATERAL SALPINGECTOMY OOPHORECTOMY;  Surgeon: Nunzio Cobbs, MD;  Location: [facility];  Service: Gynecology;  Laterality: Bilateral;  . BREAST BIOPSY  1987   Dr. Rosana Hoes  . BREAST EXCISIONAL BIOPSY Left    benign  . BREAST LUMPECTOMY WITH RADIOACTIVE SEED AND SENTINEL LYMPH NODE BIOPSY Right 12/23/2019   Procedure: RIGHT BREAST LUMPECTOMY X 3 WITH RADIOACTIVE SEED AND SENTINEL LYMPH NODE MAPPING;  Surgeon: Erroll Luna, MD;  Location: [facility];  Service: General;  Laterality: Right;  . BREAST SURGERY Right 04/2010   breast biopsy for multiple fibroadenoma - no atypia  . CATARACT EXTRACTION, BILATERAL    . CESAREAN SECTION    . CRYOTHERAPY  5/91   for persistent condyloma atypia  . CYSTOSCOPY N/A 02/14/2015   Procedure: CYSTOSCOPY;  Surgeon: Nunzio Cobbs, MD;  Location: [facility];  Service: Gynecology;  Laterality: N/A;  . LASER ABLATION OF CONDYLOMAS  03/05/90 Dr. 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 MELANOMA EXCISION Right 03/2013   melanoma right arm  . ROBOTIC ASSISTED TOTAL HYSTERECTOMY N/A 02/14/2015   Procedure: ROBOTIC ASSISTED TOTAL HYSTERECTOMY;  Surgeon: Nunzio Cobbs, MD;  Location: [facility];  Service: Gynecology;  Laterality: N/A;  . SKIN  CANCER EXCISION  06/2013   midddle of back with path report of atypia  . TUBAL LIGATION Bilateral   . WISDOM TOOTH EXTRACTION      FAMILY HISTORY: Family History  Problem Relation Age of Onset  . Cancer Mother        lung and liver  . Alcohol abuse Father        Cirrhosis of liver  . Cancer Maternal Aunt        stomach cancer  . Cancer Maternal Grandmother        uterine cancer  . Heart failure Paternal Grandmother   . Lung cancer Maternal Uncle   . Lung cancer Maternal Uncle   . Pancreatic cancer Paternal Uncle   . Testicular cancer Cousin   . Melanoma Cousin        of eye  . Cancer Cousin        possible colon? dx 67s  . Colon cancer Other   . Breast cancer Cousin   Her father died at age 73 from alcoholism. Her mother was diagnosed with lung cancer at age 48 and died at age 68. Irish is an only child. She reports uterine cancer in her maternal grandmother at age  44, stomach cancer in a maternal aunt at age 33, lung cancer in two maternal uncles, and colon cancer in a maternal great-aunt.   GYNECOLOGIC HISTORY:  Patient's last menstrual period was 10/15/2014 (exact date). Menarche: 54 years old Age at first live birth: 54 years old [place] P 1 LMP: 2009 Contraceptive: used for 14 years without issue HRT used for 12 years  Hysterectomy? Yes, 01/2015, benign pathology BSO? 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 Husband Alexis Young is a Musician.  Son Cristie Hem, age 30, is a Film/video editor.     ADVANCED DIRECTIVES: In the absence of any documentation to the contrary, the patient's spouse is their HCPOA.    HEALTH MAINTENANCE: Social History   Tobacco Use  . Smoking status: Never Smoker  . Smokeless tobacco: Never Used  Vaping Use  . Vaping Use: Never used  Substance Use Topics  . Alcohol use: No  . Drug use: No     Colonoscopy: 02/2016, repeat 2027  PAP: 03/2019, negative  Bone density:  09/2019, +0.3   Allergies  Allergen Reactions  . Tetracyclines & Related Nausea Only  . Asa [Aspirin] Nausea And Vomiting and Rash  . Sulfa Antibiotics Rash    Current Outpatient Medications  Medication Sig Dispense Refill  . gabapentin (NEURONTIN) 300 MG capsule Take 1 capsule (300 mg total) by mouth at bedtime. 90 capsule 4  . levothyroxine (SYNTHROID) 50 MCG tablet TAKE 1 TABLET BY MOUTH EVERY DAY 90 tablet 1  . tamoxifen (NOLVADEX) 20 MG tablet Take 20 mg by mouth daily.    Marland Kitchen tretinoin (RETIN-A) 0.025 % cream APPLY A PEA SIZE AMOUNT TO AFFECTED AREAS NIGHTLY.     No current facility-administered medications for this visit.    OBJECTIVE: White woman in no acute distress  There were no vitals filed for this visit.   There is no height or weight on file to calculate BMI.   Wt Readings from Last 3 Encounters:  06/29/20 168 lb 3.2 oz (76.3 kg)  05/01/20 178 lb 9.6 oz (81 kg)  03/28/20 180 lb 12.8 oz (82 kg)      ECOG FS:1 - Symptomatic but completely ambulatory  Telemedicine visit 07/03/2020   LAB RESULTS:  CMP     Component Value Date/Time   NA 141 06/29/2020 0901   NA 142 01/13/2017 1024   K 3.9 06/29/2020 0901   CL 107 06/29/2020 0901   CO2 28 06/29/2020 0901   GLUCOSE 88 06/29/2020 0901   BUN 10 06/29/2020 0901   BUN 14 01/13/2017 1024   CREATININE 0.93 06/29/2020 0901   CREATININE 0.92 11/10/2019 0812   CREATININE 0.94 01/10/2016 1001   CALCIUM 9.0 06/29/2020 0901   PROT 7.1 06/29/2020 0901   PROT 6.8 01/13/2017 1024   ALBUMIN 4.3 06/29/2020 0901   ALBUMIN 4.5 01/13/2017 1024   AST 17 06/29/2020 0901   AST 12 (L) 11/10/2019 0812   ALT 11 06/29/2020 0901   ALT 10 11/10/2019 0812   ALKPHOS 49 06/29/2020 0901   BILITOT 0.7 06/29/2020 0901   BILITOT 0.6 11/10/2019 0812   GFRNONAA >60 03/28/2020 1506   GFRNONAA >60 11/10/2019 0812   GFRAA >60 03/28/2020 1506   GFRAA >60 11/10/2019 0812    No results found for: TOTALPROTELP, ALBUMINELP, A1GS, A2GS, BETS,  BETA2SER, GAMS, MSPIKE, SPEI  Lab Results  Component Value Date   WBC 3.3 (L) 06/29/2020   NEUTROABS 1.9 06/29/2020  HGB 12.9 06/29/2020   HCT 38.5 06/29/2020   MCV 85.4 06/29/2020   PLT 167.0 06/29/2020    No results found for: LABCA2  No components found for: [id number]  No results for input(s): INR in the last 168 hours.  No results found for: LABCA2  No results found for: ZRA076  No results found for: AUQ333  No results found for: LKT625  No results found for: CA2729  No components found for: HGQUANT  No results found for: CEA1 / No results found for: CEA1   No results found for: AFPTUMOR  No results found for: CHROMOGRNA  No results found for: KPAFRELGTCHN, LAMBDASER, KAPLAMBRATIO (kappa/lambda light chains)  No results found for: HGBA, HGBA2QUANT, HGBFQUANT, HGBSQUAN (Hemoglobinopathy evaluation)   No results found for: LDH  No results found for: IRON, TIBC, IRONPCTSAT (Iron and TIBC)  No results found for: FERRITIN  Urinalysis    Component Value Date/Time   COLORURINE YELLOW 06/17/2018 [place] 06/17/2018 1649   LABSPEC 1.010 06/17/2018 1649   PHURINE 7.0 06/17/2018 1649   GLUCOSEU NEGATIVE 06/17/2018 1649   HGBUR TRACE-INTACT (A) 06/17/2018 1649   BILIRUBINUR n 05/01/2020 1336   KETONESUR NEGATIVE 06/17/2018 1649   PROTEINUR Positive (A) 05/01/2020 1336   UROBILINOGEN negative (A) 05/01/2020 1336   UROBILINOGEN 0.2 06/17/2018 1649   NITRITE n 05/01/2020 1336   NITRITE NEGATIVE 06/17/2018 1649   LEUKOCYTESUR Negative 05/01/2020 1336     STUDIES: No results found.   ELIGIBLE FOR AVAILABLE RESEARCH PROTOCOL: AET  ASSESSMENT: 54 y.o. Jearld Pies, [place] woman status post right breast upper outer quadrant biopsy 11/01/2019 for a clinical TX N0 invasive lobular carcinoma, grade 1, estrogen and progesterone receptor positive, HER-2 not amplified, with an MIB-1 of 2%.  (a) breast MRI 11/22/2019 showed 2 additional right breast  lesions and one left breast lesion, all of which were biopsied and all of which were benign  (1) status post right lumpectomy 12/23/2019 for a pT1a pN0, stage IA invasive lobular breast cancer, grade 2, with negative margins.  (a) a total of 2 axillary lymph nodes were removed  (2) adjuvant radiation Radiation Treatment Dates: 01/24/2020 through 02/21/2020 Site Technique Total Dose (Gy) Dose per Fx (Gy) Completed Fx Beam Energies  Breast, Right: Breast_Rt 3D 40.05/40.05 2.67 15/15 6X, 10X  Breast, Right: Breast_Rt_Bst 3D 10/10 2 5/5 6X, 10X    (3) genetic testing 11/19/2019 through the [facility] Common Hereditary Cancers + Melanoma panel found no deleterious mutations in APC, ATM, AXIN2, BARD1, BMPR1A, BRCA1, BRCA2, BRIP1, CDH1, CDKN2A (p14ARF), CDKN2A (p16INK4a), CKD4, CHEK2, CTNNA1, DICER1, EPCAM (Deletion/duplication testing only), GREM1 (promoter region deletion/duplication testing only), KIT, MEN1, MLH1, MSH2, MSH3, MSH6, MUTYH, NBN, NF1, NHTL1, PALB2, PDGFRA, PMS2, POLD1, POLE, PTEN, RAD50, RAD51C, RAD51D, RNF43, SDHB, SDHC, SDHD, SMAD4, SMARCA4. STK11, TP53, TSC1, TSC2, and VHL.  The following genes were evaluated for sequence changes only: SDHA and HOXB13 c.251G>A variant only.  (a) The [facility] Melanoma Panel analyzed the following 9 genes: BAP1 BRCA2 CDK4 CDKN2A MITF POT1 PTEN RB1 Tp53.  (b) VUS noted in APC, POLE, and RNF43  (4) to start tamoxifen 04/04/2020  (a) status post hysterectomy   PLAN: Davy is now 6 months out from definitive surgery for her breast cancer.  She is tolerating tamoxifen well and the plan will be to continue that a minimum of 5 years.  Her white blood cell count has drifted down.  There has been no change in the hemoglobin and platelet count which are normal.  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  I am going to obtain a repeat CBC in February.  She will then see me in May with another CBC.  We will make sure to review the blood film at  that time.  Certainly if she develops any fever or other infection she will let us know   Sarajane Jews C. Armani Brar, MD 07/03/2020 3:29 PM Medical Oncology and Hematology [facility] [place], [place] [address] Tel. [phone]    Fax. [phone]   This document serves as a record of services personally performed by Lurline Del, MD. It was created on his behalf by Wilburn Mylar, a trained medical scribe. The creation of this record is based on the scribe's personal observations and the provider's statements to them.   I, Lurline Del MD, have reviewed the above documentation for accuracy and completeness, and I agree with the above.   *Total Encounter Time as defined by the Centers for Medicare and Medicaid Services includes, in addition to the face-to-face time of a patient visit (documented in the note above) non-face-to-face time: obtaining and reviewing outside history, ordering and reviewing medications, tests or procedures, care coordination (communications with other health care professionals or caregivers) and documentation in the medical record.

## 2020-07-03 ENCOUNTER — Inpatient Hospital Stay: Payer: BC Managed Care – PPO | Attending: Oncology | Admitting: Oncology

## 2020-07-03 DIAGNOSIS — D708 Other neutropenia: Secondary | ICD-10-CM | POA: Diagnosis not present

## 2020-07-03 DIAGNOSIS — C50411 Malignant neoplasm of upper-outer quadrant of right female breast: Secondary | ICD-10-CM | POA: Diagnosis not present

## 2020-07-03 DIAGNOSIS — D72819 Decreased white blood cell count, unspecified: Secondary | ICD-10-CM | POA: Insufficient documentation

## 2020-07-03 DIAGNOSIS — Z170 Estrogen receptor positive status [ER+]: Secondary | ICD-10-CM | POA: Diagnosis not present

## 2020-09-05 ENCOUNTER — Inpatient Hospital Stay: Payer: BC Managed Care – PPO | Attending: Oncology

## 2020-09-05 ENCOUNTER — Other Ambulatory Visit: Payer: Self-pay

## 2020-09-05 DIAGNOSIS — C50411 Malignant neoplasm of upper-outer quadrant of right female breast: Secondary | ICD-10-CM | POA: Insufficient documentation

## 2020-09-05 DIAGNOSIS — Z79810 Long term (current) use of selective estrogen receptor modulators (SERMs): Secondary | ICD-10-CM | POA: Insufficient documentation

## 2020-09-05 DIAGNOSIS — Z90710 Acquired absence of both cervix and uterus: Secondary | ICD-10-CM | POA: Diagnosis not present

## 2020-09-05 DIAGNOSIS — Z170 Estrogen receptor positive status [ER+]: Secondary | ICD-10-CM | POA: Insufficient documentation

## 2020-09-05 LAB — CBC WITH DIFFERENTIAL/PLATELET
Abs Immature Granulocytes: 0 10*3/uL (ref 0.00–0.07)
Basophils Absolute: 0 10*3/uL (ref 0.0–0.1)
Basophils Relative: 1 %
Eosinophils Absolute: 0.1 10*3/uL (ref 0.0–0.5)
Eosinophils Relative: 3 %
HCT: 37.1 % (ref 36.0–46.0)
Hemoglobin: 12.2 g/dL (ref 12.0–15.0)
Immature Granulocytes: 0 %
Lymphocytes Relative: 31 %
Lymphs Abs: 1 10*3/uL (ref 0.7–4.0)
MCH: 28.6 pg (ref 26.0–34.0)
MCHC: 32.9 g/dL (ref 30.0–36.0)
MCV: 86.9 fL (ref 80.0–100.0)
Monocytes Absolute: 0.3 10*3/uL (ref 0.1–1.0)
Monocytes Relative: 9 %
Neutro Abs: 1.8 10*3/uL (ref 1.7–7.7)
Neutrophils Relative %: 56 %
Platelets: 151 10*3/uL (ref 150–400)
RBC: 4.27 MIL/uL (ref 3.87–5.11)
RDW: 13.2 % (ref 11.5–15.5)
WBC: 3.1 10*3/uL — ABNORMAL LOW (ref 4.0–10.5)
nRBC: 0 % (ref 0.0–0.2)

## 2020-09-05 LAB — COMPREHENSIVE METABOLIC PANEL
ALT: 10 U/L (ref 0–44)
AST: 14 U/L — ABNORMAL LOW (ref 15–41)
Albumin: 3.9 g/dL (ref 3.5–5.0)
Alkaline Phosphatase: 54 U/L (ref 38–126)
Anion gap: 5 (ref 5–15)
BUN: 12 mg/dL (ref 6–20)
CO2: 27 mmol/L (ref 22–32)
Calcium: 8.8 mg/dL — ABNORMAL LOW (ref 8.9–10.3)
Chloride: 108 mmol/L (ref 98–111)
Creatinine, Ser: 0.89 mg/dL (ref 0.44–1.00)
GFR, Estimated: >60 mL/min (ref >60)
Glucose, Bld: 91 mg/dL (ref 70–99)
Potassium: 3.7 mmol/L (ref 3.5–5.1)
Sodium: 140 mmol/L (ref 135–145)
Total Bilirubin: 0.7 mg/dL (ref 0.3–1.2)
Total Protein: 7 g/dL (ref 6.5–8.1)

## 2020-09-05 LAB — SAVE SMEAR(SSMR), FOR PROVIDER SLIDE REVIEW

## 2020-09-07 ENCOUNTER — Encounter: Payer: Self-pay | Admitting: Oncology

## 2020-09-11 ENCOUNTER — Other Ambulatory Visit: Payer: Self-pay | Admitting: Oncology

## 2020-09-11 DIAGNOSIS — D72819 Decreased white blood cell count, unspecified: Secondary | ICD-10-CM

## 2020-09-11 NOTE — Progress Notes (Signed)
Alexis Young called concerned that her white cell count was down.  Actually it went down after radiation was completed in July 2021.  He has not recovered.  The platelets and the hemoglobin have also drifted down although they are still in the normal range.  I called her and told her what I think we should do is repeat the labs may be in a couple of weeks and also add additional labs to make sure there is not a simple nutritional deficiency.  It would also give me an opportunity to look at her smear under the microscope.  We would then go from there.    She is comfortable with this plan.

## 2020-09-12 ENCOUNTER — Telehealth: Payer: Self-pay | Admitting: Oncology

## 2020-09-12 NOTE — Telephone Encounter (Signed)
Scheduled appt per 2/14 sch msg - pt aware.

## 2020-09-28 ENCOUNTER — Inpatient Hospital Stay: Payer: BC Managed Care – PPO | Attending: Oncology

## 2020-09-28 ENCOUNTER — Other Ambulatory Visit: Payer: Self-pay

## 2020-09-28 DIAGNOSIS — Z170 Estrogen receptor positive status [ER+]: Secondary | ICD-10-CM | POA: Insufficient documentation

## 2020-09-28 DIAGNOSIS — D72819 Decreased white blood cell count, unspecified: Secondary | ICD-10-CM | POA: Diagnosis not present

## 2020-09-28 DIAGNOSIS — Z79810 Long term (current) use of selective estrogen receptor modulators (SERMs): Secondary | ICD-10-CM | POA: Insufficient documentation

## 2020-09-28 DIAGNOSIS — C50411 Malignant neoplasm of upper-outer quadrant of right female breast: Secondary | ICD-10-CM | POA: Diagnosis present

## 2020-09-28 LAB — COMPREHENSIVE METABOLIC PANEL
ALT: 13 U/L (ref 0–44)
AST: 17 U/L (ref 15–41)
Albumin: 4 g/dL (ref 3.5–5.0)
Alkaline Phosphatase: 53 U/L (ref 38–126)
Anion gap: 8 (ref 5–15)
BUN: 11 mg/dL (ref 6–20)
CO2: 24 mmol/L (ref 22–32)
Calcium: 8.8 mg/dL — ABNORMAL LOW (ref 8.9–10.3)
Chloride: 109 mmol/L (ref 98–111)
Creatinine, Ser: 0.94 mg/dL (ref 0.44–1.00)
GFR, Estimated: >60 mL/min (ref >60)
Glucose, Bld: 91 mg/dL (ref 70–99)
Potassium: 3.5 mmol/L (ref 3.5–5.1)
Sodium: 141 mmol/L (ref 135–145)
Total Bilirubin: 0.7 mg/dL (ref 0.3–1.2)
Total Protein: 7 g/dL (ref 6.5–8.1)

## 2020-09-28 LAB — CBC WITH DIFFERENTIAL/PLATELET
Abs Immature Granulocytes: 0 10*3/uL (ref 0.00–0.07)
Basophils Absolute: 0 10*3/uL (ref 0.0–0.1)
Basophils Relative: 1 %
Eosinophils Absolute: 0 10*3/uL (ref 0.0–0.5)
Eosinophils Relative: 1 %
HCT: 38.1 % (ref 36.0–46.0)
Hemoglobin: 12.3 g/dL (ref 12.0–15.0)
Immature Granulocytes: 0 %
Lymphocytes Relative: 26 %
Lymphs Abs: 0.8 10*3/uL (ref 0.7–4.0)
MCH: 28.6 pg (ref 26.0–34.0)
MCHC: 32.3 g/dL (ref 30.0–36.0)
MCV: 88.6 fL (ref 80.0–100.0)
Monocytes Absolute: 0.2 10*3/uL (ref 0.1–1.0)
Monocytes Relative: 8 %
Neutro Abs: 2 10*3/uL (ref 1.7–7.7)
Neutrophils Relative %: 64 %
Platelets: 164 10*3/uL (ref 150–400)
RBC: 4.3 MIL/uL (ref 3.87–5.11)
RDW: 13.1 % (ref 11.5–15.5)
WBC: 3.1 10*3/uL — ABNORMAL LOW (ref 4.0–10.5)
nRBC: 0 % (ref 0.0–0.2)

## 2020-09-28 LAB — SAVE SMEAR(SSMR), FOR PROVIDER SLIDE REVIEW

## 2020-09-28 LAB — FERRITIN: Ferritin: 54 ng/mL (ref 11–307)

## 2020-09-28 LAB — IRON AND TIBC
Iron: 92 ug/dL (ref 41–142)
Saturation Ratios: 33 % (ref 21–57)
TIBC: 278 ug/dL (ref 236–444)
UIBC: 185 ug/dL (ref 120–384)

## 2020-09-28 LAB — SEDIMENTATION RATE: Sed Rate: 16 mm/hr (ref 0–22)

## 2020-09-28 LAB — VITAMIN B12: Vitamin B-12: 230 pg/mL (ref 180–914)

## 2020-09-28 LAB — FOLATE: Folate: 18.1 ng/mL (ref >5.9)

## 2020-10-02 LAB — ANTINUCLEAR ANTIBODIES, IFA: ANA Ab, IFA: NEGATIVE

## 2020-11-07 ENCOUNTER — Ambulatory Visit
Admission: RE | Admit: 2020-11-07 | Discharge: 2020-11-07 | Disposition: A | Discharge status: Home / Self Care | Payer: BC Managed Care – PPO | Source: Ambulatory Visit | Attending: Oncology | Admitting: Oncology

## 2020-11-07 ENCOUNTER — Other Ambulatory Visit: Payer: Self-pay

## 2020-11-07 DIAGNOSIS — C50411 Malignant neoplasm of upper-outer quadrant of right female breast: Secondary | ICD-10-CM

## 2020-12-03 NOTE — Progress Notes (Signed)
[place]  [phone] [phone]     ID: Alexis Young DOB: 1966-02-06  MR#: [id number]  SKA#:[id number]  Patient Care Team: Binnie Rail, MD as PCP - General (Internal Medicine) Mauro Kaufmann, RN as Oncology Nurse Navigator Rockwell Germany, RN as Oncology Nurse Navigator Willodene Stallings, Virgie Dad, MD as Consulting Physician (Oncology) Gery Pray, MD as Consulting Physician (Radiation Oncology) Erroll Luna, MD as Consulting Physician (General Surgery) Yisroel Ramming, Everardo All, MD as Consulting Physician (Obstetrics and Gynecology) Garrel Ridgel, [place] as Consulting Physician (Podiatry) Harriett Sine, MD as Consulting Physician (Dermatology) Juanita Craver, MD as Consulting Physician (Gastroenterology) Chauncey Cruel, MD OTHER MD:   CHIEF COMPLAINT: Estrogen receptor positive breast cancer  CURRENT TREATMENT: tamoxifen   INTERVAL HISTORY: Alexis Young returns today for follow up of her estrogen receptor positive breast cancer accompanied by her husband Alexis Young.   She started tamoxifen on 03/28/2020.  She is tolerating this well.  She still has some hot flashes, mostly at night.  She is taking gabapentin for this and it is "probably helping".  Since her last visit, she underwent bilateral diagnostic mammography with tomography at [facility] on 11/08/2019 showing: breast density category C; no evidence of malignancy in either breast.    REVIEW OF SYSTEMS: Alexis Young still has some sensitivity and soreness in the right, surgical breast, but otherwise review of systems today is very reassuring.  She walks about 5 miles per day.  She has had no unusual headaches visual changes cough phlegm production pleurisy shortness of breath or change in bowel or bladder habits.  A detailed review of systems was otherwise stable.   COVID 19 VACCINATION STATUS: Refuses vaccination; had COVID January 2022  HISTORY OF CURRENT ILLNESS: From the original intake  note:  Alexis Young had routine screening mammography on 10/14/2019 showing a possible abnormality in the bilateral breasts. She underwent bilateral diagnostic mammography with tomography and bilateral breast ultrasonography at [facility] on 10/27/2019 showing: breast density category C; small area of asymmetry with pronounced focal distortion in outer right breast with no ultrasound correlate; benign left breast cysts; no abnormal-appearing lymph nodes.  Accordingly on 11/01/2019 she proceeded to biopsy of the right breast area in question. The pathology from this procedure ([id number]) showed: invasive lobular carcinoma, grade 1, e-cadherin negative. Prognostic indicators significant for: estrogen receptor, 80% positive and progesterone receptor, 80% positive, both with strong staining intensity. Proliferation marker Ki67 at 2%. HER2 negative by immunohistochemistry (0).  The patient's subsequent history is as detailed below.   PAST MEDICAL HISTORY: Past Medical History:  Diagnosis Date  . Arthritis   . Breast cancer ([place]) 11/02/2019   right breast  . Cancer ([place]) 03/2013, 06/2013   malignant melanoma on right arm and back  . Complication of anesthesia    Per patient after hysterectomy in 2016, had to stay over an extra night due to low oxygen levels  . Dyspareunia   . Elevated serum creatinine   . Family history of colon cancer   . Family history of lung cancer   . Family history of melanoma   . Family history of pancreatic cancer   . Family history of stomach cancer   . Family history of uterine cancer   . Fibroid   . GERD (gastroesophageal reflux disease)    takes protonix as needed   . Hypothyroidism   . 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 Tennis elbow  2017   Right  . Tinnitus 2020   both ears    PAST SURGICAL HISTORY: Past Surgical History:  Procedure Laterality Date  . ADENOIDECTOMY     per patient as a child  . BILATERAL  SALPINGECTOMY Bilateral 02/14/2015   Procedure: BILATERAL SALPINGECTOMY OOPHORECTOMY;  Surgeon: Nunzio Cobbs, MD;  Location: [facility];  Service: Gynecology;  Laterality: Bilateral;  . BREAST BIOPSY  1987   Dr. Rosana Hoes  . BREAST EXCISIONAL BIOPSY Left    benign  . BREAST LUMPECTOMY WITH RADIOACTIVE SEED AND SENTINEL LYMPH NODE BIOPSY Right 12/23/2019   Procedure: RIGHT BREAST LUMPECTOMY X 3 WITH RADIOACTIVE SEED AND SENTINEL LYMPH NODE MAPPING;  Surgeon: Erroll Luna, MD;  Location: [facility];  Service: General;  Laterality: Right;  . BREAST SURGERY Right 04/2010   breast biopsy for multiple fibroadenoma - no atypia  . CATARACT EXTRACTION, BILATERAL    . CESAREAN SECTION    . CRYOTHERAPY  5/91   for persistent condyloma atypia  . CYSTOSCOPY N/A 02/14/2015   Procedure: CYSTOSCOPY;  Surgeon: Nunzio Cobbs, MD;  Location: [facility];  Service: Gynecology;  Laterality: N/A;  . LASER ABLATION OF CONDYLOMAS  03/05/90 Dr. 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 MELANOMA EXCISION Right 03/2013   melanoma right arm  . ROBOTIC ASSISTED TOTAL HYSTERECTOMY N/A 02/14/2015   Procedure: ROBOTIC ASSISTED TOTAL HYSTERECTOMY;  Surgeon: Nunzio Cobbs, MD;  Location: [facility];  Service: Gynecology;  Laterality: N/A;  . SKIN CANCER EXCISION  06/2013   midddle of back with path report of atypia  . TUBAL LIGATION Bilateral   . WISDOM TOOTH EXTRACTION      FAMILY HISTORY: Family History  Problem Relation Age of Onset  . Cancer Mother        lung and liver  . Alcohol abuse Father        Cirrhosis of liver  . Cancer Maternal Aunt        stomach cancer  . Cancer Maternal Grandmother        uterine cancer  . Heart failure Paternal Grandmother   . Lung cancer Maternal Uncle   . Lung cancer Maternal Uncle   . Pancreatic cancer Paternal Uncle   . Testicular cancer Cousin   . Melanoma Cousin        of eye  . Cancer Cousin        possible colon? dx 60s  . Colon cancer Other    . Breast cancer Cousin   Her father died at age 59 from alcoholism. Her mother was diagnosed with lung cancer at age 44 and died at age 52. Alexis Young is an only child. She reports uterine cancer in her maternal grandmother at age 25, stomach cancer in a maternal aunt at age 57, lung cancer in two maternal uncles, and colon cancer in a maternal great-aunt.   GYNECOLOGIC HISTORY:  Patient's last menstrual period was 10/15/2014 (exact date). Menarche: 55 years old Age at first live birth: 55 years old [place] P 1 LMP: 2009 Contraceptive: used for 14 years without issue HRT used for 12 years  Hysterectomy? Yes, 01/2015, benign pathology BSO? 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  She is now a homemaker.  Husband Alexis Young is a Musician.  Son Cristie Hem, age 69, is in college studying finances.     ADVANCED DIRECTIVES: In the absence of  any documentation to the contrary, the patient's spouse is their HCPOA.    HEALTH MAINTENANCE: Social History   Tobacco Use  . Smoking status: Never Smoker  . Smokeless tobacco: Never Used  Vaping Use  . Vaping Use: Never used  Substance Use Topics  . Alcohol use: No  . Drug use: No     Colonoscopy: 02/2016, repeat 2027  PAP: 03/2019, negative  Bone density: 09/2019, +0.3   Allergies  Allergen Reactions  . Tetracyclines & Related Nausea Only  . Asa [Aspirin] Nausea And Vomiting and Rash  . Sulfa Antibiotics Rash    Current Outpatient Medications  Medication Sig Dispense Refill  . gabapentin (NEURONTIN) 300 MG capsule Take 1 capsule (300 mg total) by mouth at bedtime. 90 capsule 4  . levothyroxine (SYNTHROID) 50 MCG tablet TAKE 1 TABLET BY MOUTH EVERY DAY 90 tablet 1  . tamoxifen (NOLVADEX) 20 MG tablet Take 20 mg by mouth daily.    Marland Kitchen tretinoin (RETIN-A) 0.025 % cream APPLY A PEA SIZE AMOUNT TO AFFECTED AREAS NIGHTLY.     No current facility-administered medications for this visit.     OBJECTIVE: White woman who appears younger than stated  Vitals:   12/04/20 0919  BP: (!) 158/90  Pulse: (!) 55  Resp: 18  Temp: 97.9 F (36.6 C)  SpO2: 100%     Body mass index is 25.52 kg/m.   Wt Readings from Last 3 Encounters:  12/04/20 148 lb 11.2 oz (67.4 kg)  06/29/20 168 lb 3.2 oz (76.3 kg)  05/01/20 178 lb 9.6 oz (81 kg)      ECOG FS:1 - Symptomatic but completely ambulatory  Sclerae unicteric, EOMs intact Wearing a mask No cervical or supraclavicular adenopathy Lungs no rales or rhonchi Heart regular rate and rhythm Abd soft, nontender, positive bowel sounds MSK no focal spinal tenderness, no upper extremity lymphedema Neuro: nonfocal, well oriented, appropriate affect Breasts: The right breast is status post lumpectomy and radiation.  There is some coarsening of the skin and mild hyperpigmentation but no evidence of local recurrence.  The left breast and both axillae are benign   LAB RESULTS:  CMP     Component Value Date/Time   NA 141 09/28/2020 0932   NA 142 01/13/2017 1024   K 3.5 09/28/2020 0932   CL 109 09/28/2020 0932   CO2 24 09/28/2020 0932   GLUCOSE 91 09/28/2020 0932   BUN 11 09/28/2020 0932   BUN 14 01/13/2017 1024   CREATININE 0.94 09/28/2020 0932   CREATININE 0.92 11/10/2019 0812   CREATININE 0.94 01/10/2016 1001   CALCIUM 8.8 (L) 09/28/2020 0932   PROT 7.0 09/28/2020 0932   PROT 6.8 01/13/2017 1024   ALBUMIN 4.0 09/28/2020 0932   ALBUMIN 4.5 01/13/2017 1024   AST 17 09/28/2020 0932   AST 12 (L) 11/10/2019 0812   ALT 13 09/28/2020 0932   ALT 10 11/10/2019 0812   ALKPHOS 53 09/28/2020 0932   BILITOT 0.7 09/28/2020 0932   BILITOT 0.6 11/10/2019 0812   GFRNONAA >60 09/28/2020 0932   GFRNONAA >60 11/10/2019 0812   GFRAA >60 03/28/2020 1506   GFRAA >60 11/10/2019 0812    No results found for: TOTALPROTELP, ALBUMINELP, A1GS, A2GS, BETS, BETA2SER, GAMS, MSPIKE, SPEI  Lab Results  Component Value Date   WBC 3.5 (L) 12/04/2020    NEUTROABS 2.1 12/04/2020   HGB 11.9 (L) 12/04/2020   HCT 36.0 12/04/2020   MCV 87.6 12/04/2020   PLT 148 (L) 12/04/2020  No results found for: LABCA2  No components found for: [id number]  No results for input(s): INR in the last 168 hours.  No results found for: LABCA2  No results found for: WUJ811  No results found for: BJY782  No results found for: NFA213  No results found for: CA2729  No components found for: HGQUANT  No results found for: CEA1 / No results found for: CEA1   No results found for: AFPTUMOR  No results found for: CHROMOGRNA  No results found for: KPAFRELGTCHN, LAMBDASER, KAPLAMBRATIO (kappa/lambda light chains)  No results found for: HGBA, HGBA2QUANT, HGBFQUANT, HGBSQUAN (Hemoglobinopathy evaluation)   No results found for: LDH  Lab Results  Component Value Date   IRON 92 09/28/2020   TIBC 278 09/28/2020   IRONPCTSAT 33 09/28/2020   (Iron and TIBC)  Lab Results  Component Value Date   FERRITIN 54 09/28/2020    Urinalysis    Component Value Date/Time   COLORURINE YELLOW 06/17/2018 [place] 06/17/2018 1649   LABSPEC 1.010 06/17/2018 1649   PHURINE 7.0 06/17/2018 1649   GLUCOSEU NEGATIVE 06/17/2018 1649   HGBUR TRACE-INTACT (A) 06/17/2018 1649   BILIRUBINUR n 05/01/2020 1336   KETONESUR NEGATIVE 06/17/2018 1649   PROTEINUR Positive (A) 05/01/2020 1336   UROBILINOGEN negative (A) 05/01/2020 1336   UROBILINOGEN 0.2 06/17/2018 1649   NITRITE n 05/01/2020 1336   NITRITE NEGATIVE 06/17/2018 1649   LEUKOCYTESUR Negative 05/01/2020 1336    STUDIES: MM DIAG BREAST TOMO BILATERAL  Result Date: 11/07/2020 CLINICAL DATA:  Status post right lumpectomy for breast carcinoma, performed on 12/23/2019. Patient also had a subsequent ultrasound-guided biopsy of the left breast which yielded benign resolving fat necrosis. This was performed on 04/05/2020. EXAM: DIGITAL DIAGNOSTIC BILATERAL MAMMOGRAM WITH TOMOSYNTHESIS AND CAD  TECHNIQUE: Bilateral digital diagnostic mammography and breast tomosynthesis was performed. The images were evaluated with computer-aided detection. COMPARISON:  Previous exam(s). ACR Breast Density Category c: The breast tissue is heterogeneously dense, which may obscure small masses. FINDINGS: Post lumpectomy changes are noted the lateral right breast. There are no masses or areas of non-surgical architectural distortion. There are no new or suspicious calcifications. IMPRESSION: 1. No evidence of new or recurrent breast carcinoma. 2. Benign post lumpectomy/treatment changes on the right. RECOMMENDATION: Diagnostic mammography in 1 year per standard post lumpectomy protocol. I have discussed the findings and recommendations with the patient. If applicable, a reminder letter will be sent to the patient regarding the next appointment. BI-RADS CATEGORY  2: Benign. Electronically Signed   By: Lajean Manes M.D.   On: 11/07/2020 10:31     ELIGIBLE FOR AVAILABLE RESEARCH PROTOCOL: AET  ASSESSMENT: 55 y.o. [place], [place] woman status post right breast upper outer quadrant biopsy 11/01/2019 for a clinical TX N0 invasive lobular carcinoma, grade 1, estrogen and progesterone receptor positive, HER-2 not amplified, with an MIB-1 of 2%.  (a) breast MRI 11/22/2019 showed 2 additional right breast lesions and one left breast lesion, all of which were biopsied and all of which were benign  (1) status post right lumpectomy 12/23/2019 for a pT1a pN0, stage IA invasive lobular breast cancer, grade 2, with negative margins.  (a) a total of 2 axillary lymph nodes were removed  (2) adjuvant radiation Radiation Treatment Dates: 01/24/2020 through 02/21/2020 Site Technique Total Dose (Gy) Dose per Fx (Gy) Completed Fx Beam Energies  Breast, Right: Breast_Rt 3D 40.05/40.05 2.67 15/15 6X, 10X  Breast, Right: Breast_Rt_Bst 3D 10/10 2 5/5 6X, 10X    (3) genetic  testing 11/19/2019 through the [facility] Common Hereditary Cancers +  Melanoma panel found no deleterious mutations in APC, ATM, AXIN2, BARD1, BMPR1A, BRCA1, BRCA2, BRIP1, CDH1, CDKN2A (p14ARF), CDKN2A (p16INK4a), CKD4, CHEK2, CTNNA1, DICER1, EPCAM (Deletion/duplication testing only), GREM1 (promoter region deletion/duplication testing only), KIT, MEN1, MLH1, MSH2, MSH3, MSH6, MUTYH, NBN, NF1, NHTL1, PALB2, PDGFRA, PMS2, POLD1, POLE, PTEN, RAD50, RAD51C, RAD51D, RNF43, SDHB, SDHC, SDHD, SMAD4, SMARCA4. STK11, TP53, TSC1, TSC2, and VHL.  The following genes were evaluated for sequence changes only: SDHA and HOXB13 c.251G>A variant only.  (a) The [facility] Melanoma Panel analyzed the following 9 genes: BAP1 BRCA2 CDK4 CDKN2A MITF POT1 PTEN RB1 Tp53.  (b) VUS noted in APC, POLE, and RNF43  (4) started tamoxifen September 2021  (a) status post hysterectomy with bilateral salpingo-oophorectomy (5) persistent mild leukopenia  (a) baseline ferritin, serum iron, folate, and B12 within normal limits March/April 2020; also negative ANA and normal sedimentation rate 09/28/2020  (b) review of peripheral blood film 12/04/2020 unremarkable   PLAN: Ameliarose is now a year out from definitive surgery for her breast cancer with no evidence of disease recurrence.  This is very favorable.  She is tolerating tamoxifen well and the plan will be to continue that a minimum of 5 years.  Her white cell count is slightly improved.  On the other hand she has slightly low hemoglobin and slightly low platelet count.  I reviewed the peripheral blood film and there are no abnormalities of concern.  Specifically there is no evidence of large granular lymphocytosis, no nucleated red blood cells or other evidence of possible marrow involvement, and really no changes of concern.  Note that she started tamoxifen in September and she already had a low white cell count in August.  She did finished radiation in July.  It is difficult to believe that radiation to the right breast however would result in low  counts.  Certainly that would not be common.  We discussed possibly moving onto a bone marrow biopsy but I really do think that is excessive at this point instead we will repeat blood work in October.  I will call her with those results.  Otherwise she already has appointments late this year with Drs. Quincy Simmonds and Burns and she will see [place] again a year from now for routine follow-up of her breast cancer.  Total encounter time 25 minutes.Sarajane Jews C. Susano Cleckler, MD 12/04/2020 9:34 AM Medical Oncology and Hematology [facility] [place], [place] [address] Tel. [phone]    Fax. [phone]   This document serves as a record of services personally performed by Lurline Del, MD. It was created on his behalf by Wilburn Mylar, a trained medical scribe. The creation of this record is based on the scribe's personal observations and the provider's statements to them.   I, Lurline Del MD, have reviewed the above documentation for accuracy and completeness, and I agree with the above.   *Total Encounter Time as defined by the Centers for Medicare and Medicaid Services includes, in addition to the face-to-face time of a patient visit (documented in the note above) non-face-to-face time: obtaining and reviewing outside history, ordering and reviewing medications, tests or procedures, care coordination (communications with other health care professionals or caregivers) and documentation in the medical record.

## 2020-12-04 ENCOUNTER — Inpatient Hospital Stay: Payer: BC Managed Care – PPO

## 2020-12-04 ENCOUNTER — Inpatient Hospital Stay: Payer: BC Managed Care – PPO | Attending: Oncology | Admitting: Oncology

## 2020-12-04 ENCOUNTER — Other Ambulatory Visit: Payer: Self-pay

## 2020-12-04 VITALS — BP 158/90 | HR 55 | Temp 97.9°F | Resp 18 | Ht 64.0 in | Wt 148.7 lb

## 2020-12-04 DIAGNOSIS — C50411 Malignant neoplasm of upper-outer quadrant of right female breast: Secondary | ICD-10-CM

## 2020-12-04 DIAGNOSIS — Z923 Personal history of irradiation: Secondary | ICD-10-CM | POA: Diagnosis not present

## 2020-12-04 DIAGNOSIS — Z170 Estrogen receptor positive status [ER+]: Secondary | ICD-10-CM | POA: Insufficient documentation

## 2020-12-04 DIAGNOSIS — Z79810 Long term (current) use of selective estrogen receptor modulators (SERMs): Secondary | ICD-10-CM | POA: Diagnosis not present

## 2020-12-04 LAB — SAVE SMEAR(SSMR), FOR PROVIDER SLIDE REVIEW

## 2020-12-04 LAB — COMPREHENSIVE METABOLIC PANEL
ALT: 11 U/L (ref 0–44)
AST: 19 U/L (ref 15–41)
Albumin: 3.8 g/dL (ref 3.5–5.0)
Alkaline Phosphatase: 54 U/L (ref 38–126)
Anion gap: 8 (ref 5–15)
BUN: 13 mg/dL (ref 6–20)
CO2: 23 mmol/L (ref 22–32)
Calcium: 8.7 mg/dL — ABNORMAL LOW (ref 8.9–10.3)
Chloride: 110 mmol/L (ref 98–111)
Creatinine, Ser: 0.93 mg/dL (ref 0.44–1.00)
GFR, Estimated: >60 mL/min (ref >60)
Glucose, Bld: 96 mg/dL (ref 70–99)
Potassium: 4 mmol/L (ref 3.5–5.1)
Sodium: 141 mmol/L (ref 135–145)
Total Bilirubin: 0.5 mg/dL (ref 0.3–1.2)
Total Protein: 6.6 g/dL (ref 6.5–8.1)

## 2020-12-04 LAB — CBC WITH DIFFERENTIAL/PLATELET
Abs Immature Granulocytes: 0 10*3/uL (ref 0.00–0.07)
Basophils Absolute: 0 10*3/uL (ref 0.0–0.1)
Basophils Relative: 1 %
Eosinophils Absolute: 0.1 10*3/uL (ref 0.0–0.5)
Eosinophils Relative: 2 %
HCT: 36 % (ref 36.0–46.0)
Hemoglobin: 11.9 g/dL — ABNORMAL LOW (ref 12.0–15.0)
Immature Granulocytes: 0 %
Lymphocytes Relative: 30 %
Lymphs Abs: 1.1 10*3/uL (ref 0.7–4.0)
MCH: 29 pg (ref 26.0–34.0)
MCHC: 33.1 g/dL (ref 30.0–36.0)
MCV: 87.6 fL (ref 80.0–100.0)
Monocytes Absolute: 0.3 10*3/uL (ref 0.1–1.0)
Monocytes Relative: 8 %
Neutro Abs: 2.1 10*3/uL (ref 1.7–7.7)
Neutrophils Relative %: 59 %
Platelets: 148 10*3/uL — ABNORMAL LOW (ref 150–400)
RBC: 4.11 MIL/uL (ref 3.87–5.11)
RDW: 12.4 % (ref 11.5–15.5)
WBC: 3.5 10*3/uL — ABNORMAL LOW (ref 4.0–10.5)
nRBC: 0 % (ref 0.0–0.2)

## 2020-12-07 ENCOUNTER — Telehealth: Payer: Self-pay | Admitting: Hematology and Oncology

## 2020-12-07 NOTE — Telephone Encounter (Signed)
Sch per 5/11 los, pt aware

## 2020-12-21 ENCOUNTER — Other Ambulatory Visit: Payer: Self-pay | Admitting: Internal Medicine

## 2021-02-01 ENCOUNTER — Ambulatory Visit (INDEPENDENT_AMBULATORY_CARE_PROVIDER_SITE_OTHER): Payer: BC Managed Care – PPO | Admitting: Podiatry

## 2021-02-01 ENCOUNTER — Other Ambulatory Visit: Payer: Self-pay

## 2021-02-01 DIAGNOSIS — M2011 Hallux valgus (acquired), right foot: Secondary | ICD-10-CM

## 2021-02-01 DIAGNOSIS — Q828 Other specified congenital malformations of skin: Secondary | ICD-10-CM

## 2021-02-02 NOTE — Progress Notes (Signed)
She presents today chief complaint of hardened skin on of the right foot states has been there for about 6 months she thinks is a callus she states is painful when walking about a 7 out of 10 she is also concerned with the third toenail is black and that concerned her.  She says really nothing is painful there though.  Objective: Vital signs are stable she is alert and oriented x3.  Pulses are palpable.  Neurologic sensorium is intact Deetjen reflexes are intact muscle strength is normal symmetrical.  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  I debrided the callus today to normal tissue no pain.  She does have a subungual hematoma to the third nail plate right foot.  Does appear to be older since it is already starting to grow out.  Assessment hallux valgus resulting in a pinch callus along the medial aspect of the hallux right.  Subungual hematoma third right.  Plan: Debridement of benign skin lesion hallux right.  Follow-up with her as needed.

## 2021-02-03 ENCOUNTER — Encounter (HOSPITAL_COMMUNITY): Payer: Self-pay

## 2021-03-15 ENCOUNTER — Other Ambulatory Visit: Payer: Self-pay | Admitting: Oncology

## 2021-03-21 ENCOUNTER — Encounter: Payer: Self-pay | Admitting: Oncology

## 2021-03-22 ENCOUNTER — Other Ambulatory Visit: Payer: Self-pay | Admitting: *Deleted

## 2021-03-23 ENCOUNTER — Telehealth: Payer: Self-pay | Admitting: Hematology and Oncology

## 2021-03-23 NOTE — Telephone Encounter (Signed)
Scheduled appt per 8/25 sch msg. Called pt, no answer. Left msg with appt date and time.

## 2021-03-28 ENCOUNTER — Other Ambulatory Visit: Payer: Self-pay

## 2021-03-28 ENCOUNTER — Inpatient Hospital Stay: Payer: BC Managed Care – PPO

## 2021-03-28 ENCOUNTER — Inpatient Hospital Stay: Payer: BC Managed Care – PPO | Attending: Hematology and Oncology

## 2021-03-28 DIAGNOSIS — Z170 Estrogen receptor positive status [ER+]: Secondary | ICD-10-CM | POA: Diagnosis not present

## 2021-03-28 DIAGNOSIS — Z79810 Long term (current) use of selective estrogen receptor modulators (SERMs): Secondary | ICD-10-CM | POA: Insufficient documentation

## 2021-03-28 DIAGNOSIS — C50411 Malignant neoplasm of upper-outer quadrant of right female breast: Secondary | ICD-10-CM | POA: Diagnosis present

## 2021-03-28 DIAGNOSIS — Z923 Personal history of irradiation: Secondary | ICD-10-CM | POA: Diagnosis not present

## 2021-03-28 LAB — CBC WITH DIFFERENTIAL/PLATELET
Abs Immature Granulocytes: 0 10*3/uL (ref 0.00–0.07)
Basophils Absolute: 0 10*3/uL (ref 0.0–0.1)
Basophils Relative: 1 %
Eosinophils Absolute: 0.1 10*3/uL (ref 0.0–0.5)
Eosinophils Relative: 2 %
HCT: 36.6 % (ref 36.0–46.0)
Hemoglobin: 12.2 g/dL (ref 12.0–15.0)
Immature Granulocytes: 0 %
Lymphocytes Relative: 33 %
Lymphs Abs: 1 10*3/uL (ref 0.7–4.0)
MCH: 29.3 pg (ref 26.0–34.0)
MCHC: 33.3 g/dL (ref 30.0–36.0)
MCV: 87.8 fL (ref 80.0–100.0)
Monocytes Absolute: 0.2 10*3/uL (ref 0.1–1.0)
Monocytes Relative: 8 %
Neutro Abs: 1.7 10*3/uL (ref 1.7–7.7)
Neutrophils Relative %: 56 %
Platelets: 149 10*3/uL — ABNORMAL LOW (ref 150–400)
RBC: 4.17 MIL/uL (ref 3.87–5.11)
RDW: 13 % (ref 11.5–15.5)
WBC: 3.1 10*3/uL — ABNORMAL LOW (ref 4.0–10.5)
nRBC: 0 % (ref 0.0–0.2)

## 2021-03-28 LAB — COMPREHENSIVE METABOLIC PANEL
ALT: 13 U/L (ref 0–44)
AST: 17 U/L (ref 15–41)
Albumin: 4 g/dL (ref 3.5–5.0)
Alkaline Phosphatase: 54 U/L (ref 38–126)
Anion gap: 8 (ref 5–15)
BUN: 18 mg/dL (ref 6–20)
CO2: 26 mmol/L (ref 22–32)
Calcium: 9.2 mg/dL (ref 8.9–10.3)
Chloride: 108 mmol/L (ref 98–111)
Creatinine, Ser: 0.97 mg/dL (ref 0.44–1.00)
GFR, Estimated: >60 mL/min (ref >60)
Glucose, Bld: 92 mg/dL (ref 70–99)
Potassium: 3.9 mmol/L (ref 3.5–5.1)
Sodium: 142 mmol/L (ref 135–145)
Total Bilirubin: 0.9 mg/dL (ref 0.3–1.2)
Total Protein: 7.1 g/dL (ref 6.5–8.1)

## 2021-03-28 LAB — SAVE SMEAR(SSMR), FOR PROVIDER SLIDE REVIEW

## 2021-04-02 ENCOUNTER — Other Ambulatory Visit: Payer: Self-pay | Admitting: Oncology

## 2021-04-02 ENCOUNTER — Encounter: Payer: Self-pay | Admitting: Oncology

## 2021-05-01 ENCOUNTER — Other Ambulatory Visit: Payer: Self-pay

## 2021-05-01 ENCOUNTER — Inpatient Hospital Stay: Payer: BC Managed Care – PPO | Attending: Hematology and Oncology

## 2021-05-01 DIAGNOSIS — Z923 Personal history of irradiation: Secondary | ICD-10-CM | POA: Diagnosis not present

## 2021-05-01 DIAGNOSIS — Z170 Estrogen receptor positive status [ER+]: Secondary | ICD-10-CM | POA: Diagnosis not present

## 2021-05-01 DIAGNOSIS — Z79810 Long term (current) use of selective estrogen receptor modulators (SERMs): Secondary | ICD-10-CM | POA: Insufficient documentation

## 2021-05-01 DIAGNOSIS — C50411 Malignant neoplasm of upper-outer quadrant of right female breast: Secondary | ICD-10-CM | POA: Diagnosis not present

## 2021-05-01 LAB — COMPREHENSIVE METABOLIC PANEL
ALT: 14 U/L (ref 0–44)
AST: 18 U/L (ref 15–41)
Albumin: 4 g/dL (ref 3.5–5.0)
Alkaline Phosphatase: 51 U/L (ref 38–126)
Anion gap: 6 (ref 5–15)
BUN: 14 mg/dL (ref 6–20)
CO2: 27 mmol/L (ref 22–32)
Calcium: 9.1 mg/dL (ref 8.9–10.3)
Chloride: 108 mmol/L (ref 98–111)
Creatinine, Ser: 0.9 mg/dL (ref 0.44–1.00)
GFR, Estimated: >60 mL/min (ref >60)
Glucose, Bld: 92 mg/dL (ref 70–99)
Potassium: 3.8 mmol/L (ref 3.5–5.1)
Sodium: 141 mmol/L (ref 135–145)
Total Bilirubin: 0.8 mg/dL (ref 0.3–1.2)
Total Protein: 7 g/dL (ref 6.5–8.1)

## 2021-05-01 LAB — CBC WITH DIFFERENTIAL/PLATELET
Abs Immature Granulocytes: 0.01 10*3/uL (ref 0.00–0.07)
Basophils Absolute: 0 10*3/uL (ref 0.0–0.1)
Basophils Relative: 1 %
Eosinophils Absolute: 0.1 10*3/uL (ref 0.0–0.5)
Eosinophils Relative: 2 %
HCT: 36.5 % (ref 36.0–46.0)
Hemoglobin: 11.9 g/dL — ABNORMAL LOW (ref 12.0–15.0)
Immature Granulocytes: 0 %
Lymphocytes Relative: 37 %
Lymphs Abs: 1.2 10*3/uL (ref 0.7–4.0)
MCH: 28.7 pg (ref 26.0–34.0)
MCHC: 32.6 g/dL (ref 30.0–36.0)
MCV: 88 fL (ref 80.0–100.0)
Monocytes Absolute: 0.3 10*3/uL (ref 0.1–1.0)
Monocytes Relative: 8 %
Neutro Abs: 1.8 10*3/uL (ref 1.7–7.7)
Neutrophils Relative %: 52 %
Platelets: 143 10*3/uL — ABNORMAL LOW (ref 150–400)
RBC: 4.15 MIL/uL (ref 3.87–5.11)
RDW: 12.6 % (ref 11.5–15.5)
WBC: 3.3 10*3/uL — ABNORMAL LOW (ref 4.0–10.5)
nRBC: 0 % (ref 0.0–0.2)

## 2021-05-01 LAB — SAVE SMEAR(SSMR), FOR PROVIDER SLIDE REVIEW

## 2021-05-17 ENCOUNTER — Ambulatory Visit: Payer: BC Managed Care – PPO | Admitting: Podiatry

## 2021-05-28 NOTE — Progress Notes (Signed)
55 y.o. G59P1001 Married Caucasian female here for annual exam.    Up to the bathroom at night.  Up at least once.  Daytime frequency is 4 - 5 times a day. No pain with urination.  Urinary loss with sneeze.  No leak with cough or laugh. Does Kegel's.  Lost about 60 pounds.   PCP:   Billey Gosling, MD  Patient's last menstrual period was 10/15/2014 (exact date).           Sexually active: Yes.    The current method of family planning is status post hysterectomy.    Exercising: Yes.     Walks 5 miles/day Smoker:  no  Health Maintenance: Pap:  12-20-14 neg HPV HR neg History of abnormal Pap:  yes,hx of cryotherapy to cervix yrs ago. Final surgical pathology of cervix was benign MMG:  11-07-20 category c density birads 2:neg Colonoscopy:  2017 f/u 97yrs BMD:   2021 - normal.  TDaP:  2018 Gardasil:   no HIV: neg 2017 Hep C: neg 2020 Screening Labs:  PCP.  Flu vaccine:  Declines.  Covid:  Declines.   reports that she has never smoked. She has never used smokeless tobacco. She reports that she does not drink alcohol and does not use drugs.  Past Medical History:  Diagnosis Date   Arthritis    Breast cancer ([place]) 11/02/2019   right breast   Cancer ([place]) 03/2013, 06/2013   malignant melanoma on right arm and back   Complication of anesthesia    Per patient after hysterectomy in 2016, had to stay over an extra night due to low oxygen levels   Dyspareunia    Elevated serum creatinine    Family history of colon cancer    Family history of lung cancer    Family history of melanoma    Family history of pancreatic cancer    Family history of stomach cancer    Family history of uterine cancer    Fibroid    GERD (gastroesophageal reflux disease)    takes protonix as needed    Hypothyroidism    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 Rosana Hoes   BREAST EXCISIONAL BIOPSY Left    benign   BREAST LUMPECTOMY WITH RADIOACTIVE SEED AND SENTINEL LYMPH NODE BIOPSY Right 12/23/2019   Procedure: RIGHT BREAST LUMPECTOMY X 3 WITH RADIOACTIVE SEED AND SENTINEL LYMPH NODE MAPPING;  Surgeon: Erroll Luna, MD;  Location: [facility];  Service: General;  Laterality: Right;   BREAST SURGERY Right 04/2010   breast biopsy for multiple fibroadenoma - no atypia   CATARACT EXTRACTION, BILATERAL     CESAREAN SECTION     CRYOTHERAPY  5/91   for persistent condyloma atypia   CYSTOSCOPY N/A 02/14/2015   Procedure: CYSTOSCOPY;  Surgeon: Nunzio Cobbs, MD;  Location: [facility];  Service: Gynecology;  Laterality: N/A;   LASER ABLATION OF CONDYLOMAS  03/05/90 Dr. 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   MELANOMA EXCISION Right 03/2013   melanoma  right arm   ROBOTIC ASSISTED TOTAL HYSTERECTOMY N/A 02/14/2015   Procedure: ROBOTIC ASSISTED TOTAL HYSTERECTOMY;  Surgeon: Nunzio Cobbs, MD;  Location: [facility];  Service: Gynecology;  Laterality: N/A;   SKIN CANCER EXCISION  06/2013   midddle of back with path report of atypia   TUBAL LIGATION Bilateral    WISDOM TOOTH EXTRACTION      Current Outpatient Medications  Medication Sig Dispense Refill   gabapentin (NEURONTIN) 300 MG capsule TAKE 1 CAPSULE BY MOUTH EVERYDAY AT BEDTIME 90 capsule 4   levothyroxine (SYNTHROID) 50 MCG tablet TAKE 1 TABLET BY MOUTH EVERY DAY 90 tablet 1   tamoxifen (NOLVADEX) 20 MG tablet Take 20 mg by mouth daily.     tretinoin (RETIN-A) 0.025 % cream APPLY A PEA SIZE AMOUNT TO AFFECTED AREAS NIGHTLY.     No current facility-administered medications for this visit.    Family History  Problem  Relation Age of Onset   Cancer Mother        lung and liver   Alcohol abuse Father        Cirrhosis of liver   Cancer Maternal Aunt        stomach cancer   Cancer Maternal Grandmother        uterine cancer   Heart failure Paternal Grandmother    Lung cancer Maternal Uncle    Lung cancer Maternal Uncle    Pancreatic cancer Paternal Uncle    Testicular cancer Cousin    Melanoma Cousin        of eye   Cancer Cousin        possible colon? dx 94s   Colon cancer Other    Breast cancer Cousin     Review of Systems  Genitourinary:  Positive for frequency.  All other systems reviewed and are negative.  Exam:   BP 138/78   Pulse (!) 57   Ht 5' 4.5" (1.638 m)   Wt 135 lb (61.2 kg)   LMP 10/15/2014 (Exact Date)   SpO2 99%   BMI 22.81 kg/m     General appearance: alert, cooperative and appears stated age Head: normocephalic, without obvious abnormality, atraumatic Neck: no adenopathy, supple, symmetrical, trachea midline and thyroid normal to inspection and palpation Lungs: clear to auscultation bilaterally Breasts: left - normal appearance, no masses or tenderness, No nipple retraction or dimpling, No nipple discharge or bleeding, No axillary adenopathy Right- skin changes consistent with surgery and radiation, no masses or tenderness, No nipple discharge or bleeding, No axillary adenopathy Heart: regular rate and rhythm Abdomen: soft, non-tender; no masses, no organomegaly Extremities: extremities normal, atraumatic, no cyanosis or edema Skin: skin color, texture, turgor normal. No rashes or lesions Lymph nodes: cervical, supraclavicular, and axillary nodes normal. Neurologic: grossly normal  Pelvic: External genitalia:  no lesions              No abnormal inguinal nodes palpated.              Urethra:  normal appearing urethra with no masses, tenderness or lesions              Bartholins and Skenes: normal                 Vagina: normal appearing vagina with normal color and  discharge, no lesions              Cervix: absent              Pap taken:  No. Bimanual Exam:  Uterus:  absent              Adnexa: no mass, fullness, tenderness              Rectal exam: Yes.  .  Confirms.              Anus:  normal sphincter tone, no lesions  Chaperone was present for exam:  Estill Bamberg, CMA  Assessment:   Well woman visit with gynecologic exam. Status post robotic hysterectomy and BSO. Hx premature ovarian failure since age 8.  Right breast cancer.  Status post lumpectomy and XRT.  On Tamoxifen.  Negative genetic testing.  Hx melanoma. Hx hypothyroidism. On Synthroid. PCP prescribing. Successful weight loss. Urinary frequency.  Plan: Mammogram screening discussed. Self breast awareness reviewed. Pap and HR HPV not indicated.  Guidelines for Calcium, Vitamin D, regular exercise program including cardiovascular and weight bearing exercise. Labs with PCP.  Urinalysis:  sg 1.015, pH 7.0, 0 - 5 WBC, 0 - 2 RBC, 6 - 10 epis, moderate bacteria, few mucus.  UC sent.  No abx at this time.  Patient agrees to wait for urine culture report.  Follow up annually and prn.   After visit summary provided.

## 2021-05-29 IMAGING — MG MM PLC BREAST LOC DEV 1ST LESION INC*R*
8 of 16 series · 8 of 16 positions shown · non-contrast
Comparison: Previous exam(s).

CLINICAL DATA: Total of 3 radioactive seeds were requested to be
placed in the right breast prior to surgery.

[R CC (1 of 7)]
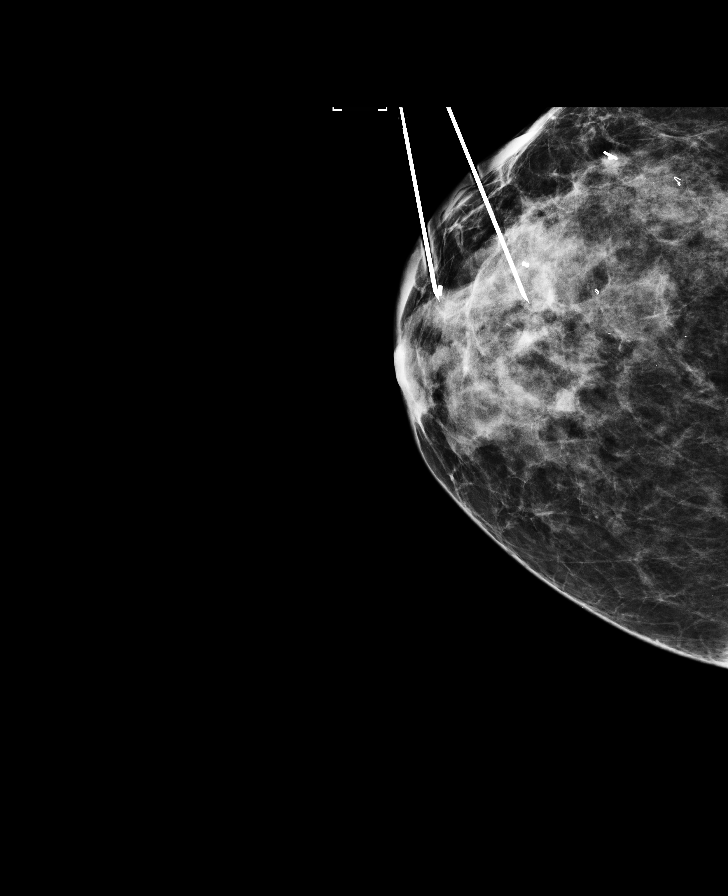

[R CC (2 of 7)]
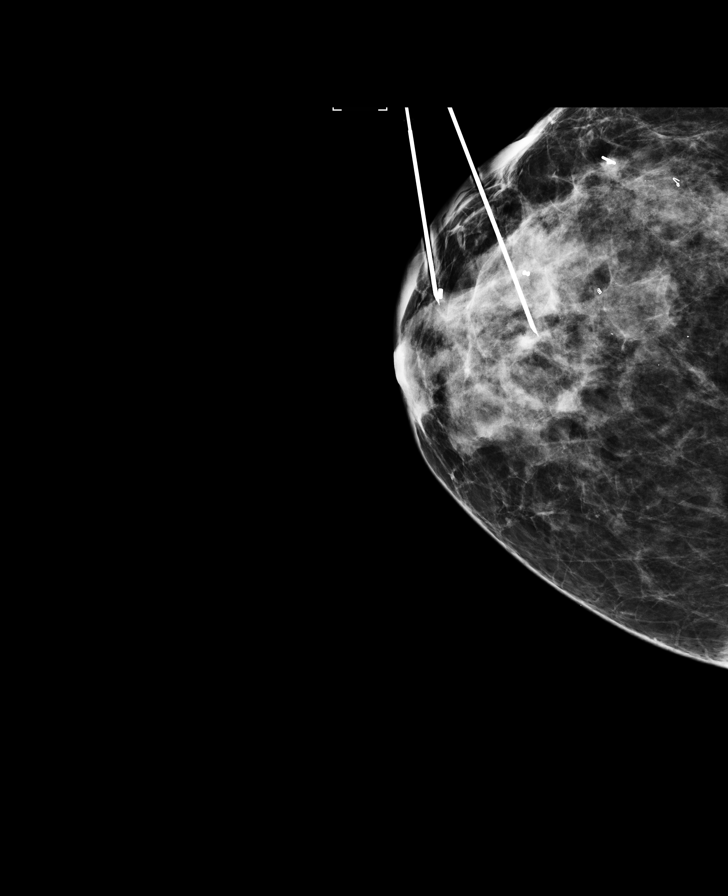

[R CC (3 of 7)]
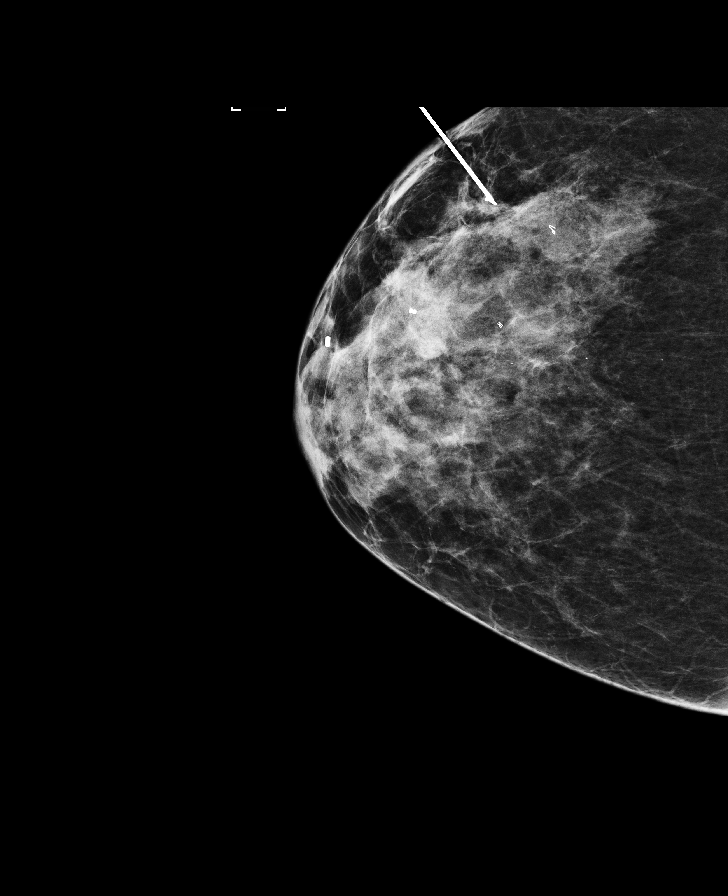

[R CC (4 of 7)]
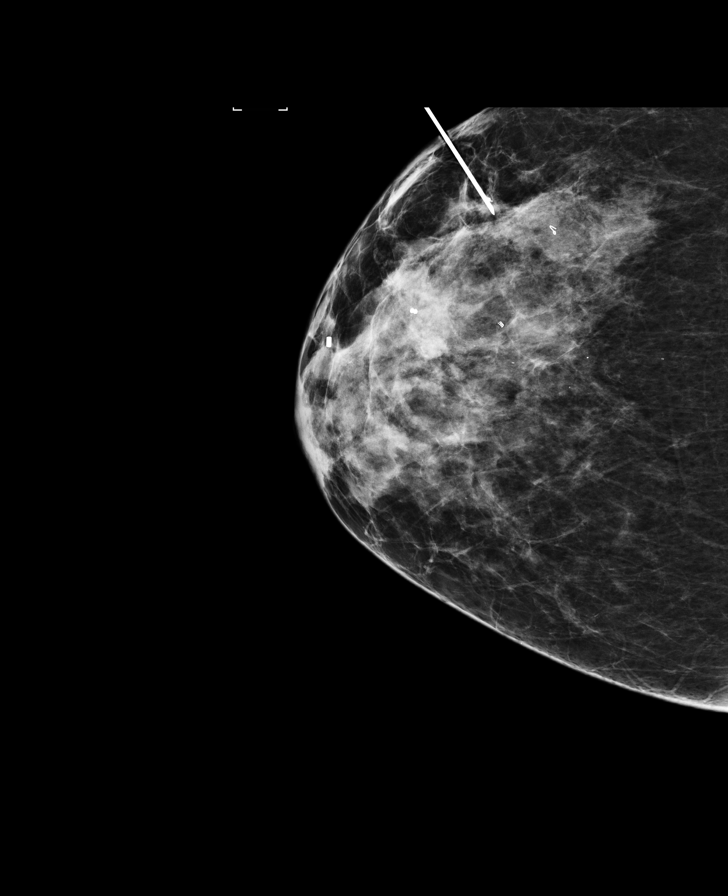

[R CC (5 of 7)]
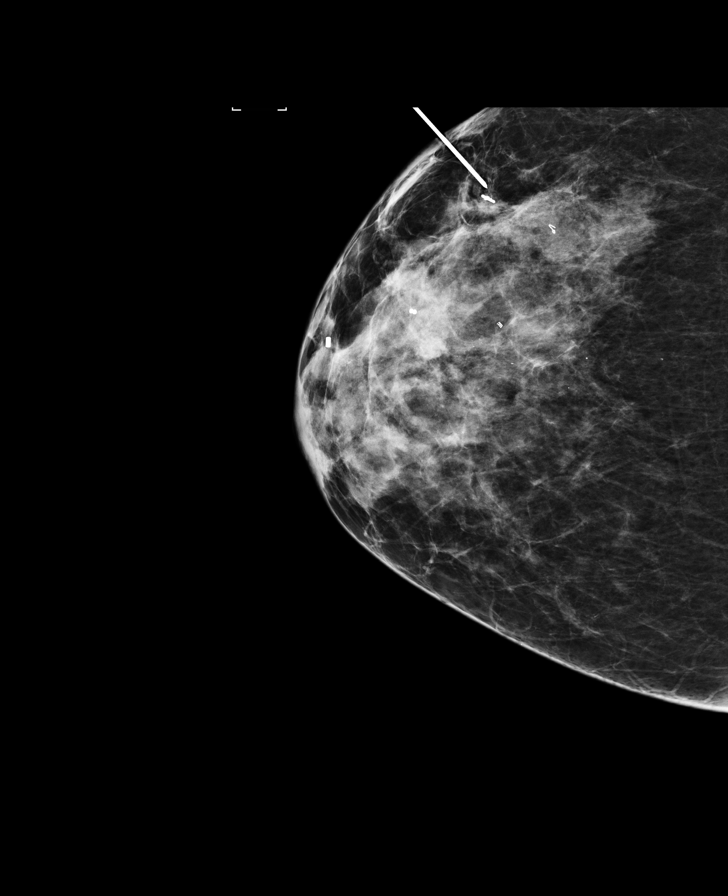

[R CC (6 of 7)]
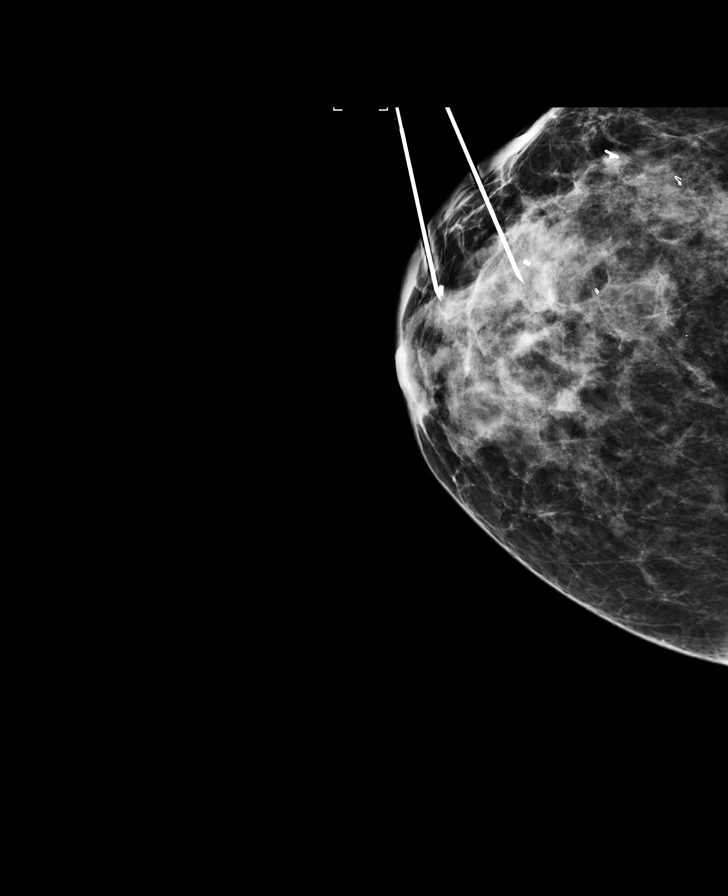

[R LM]
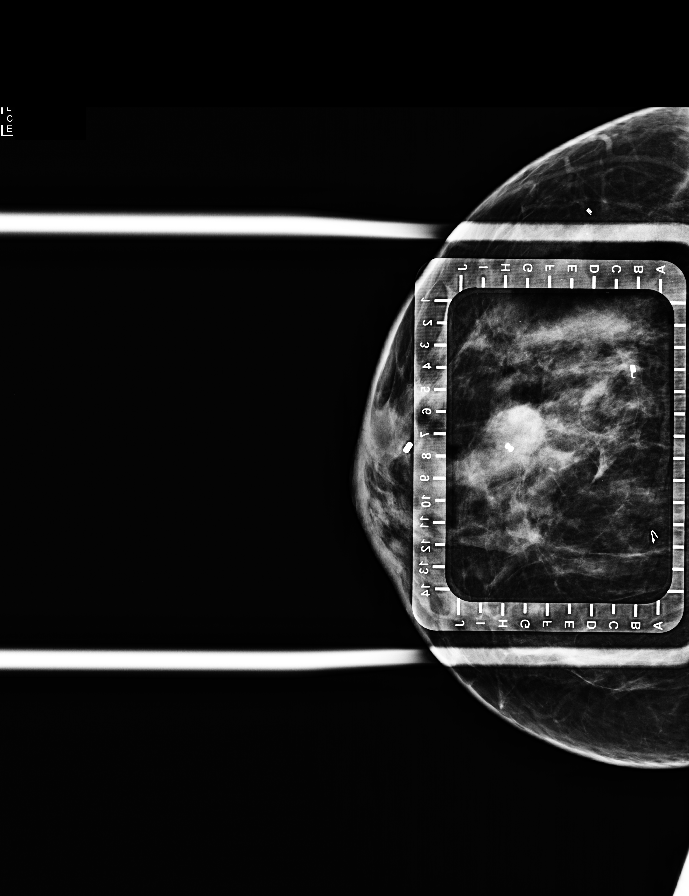

[R CC (7 of 7)]
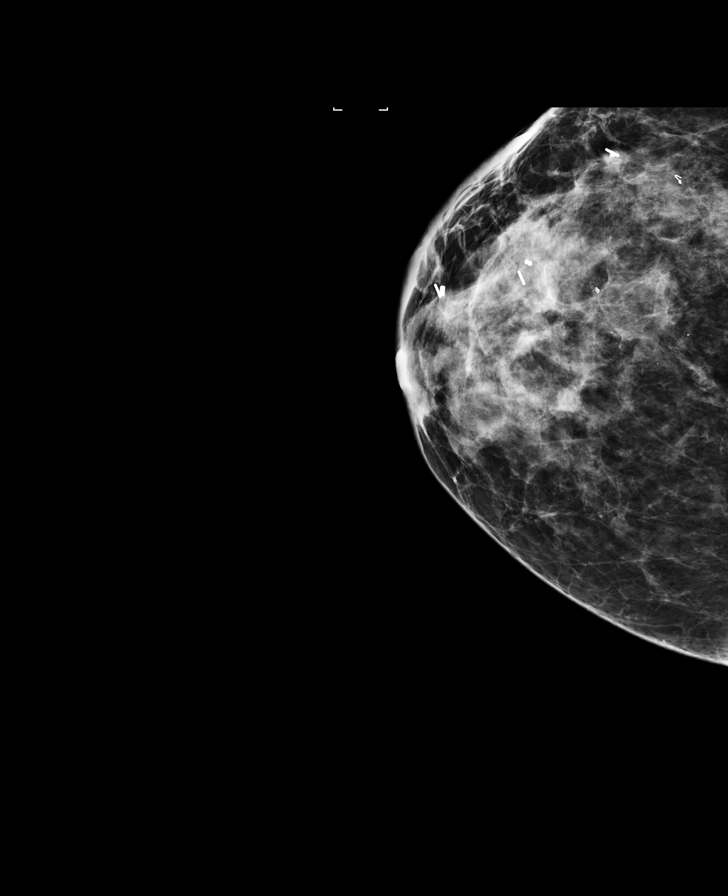

[8 of 16 positions shown; findings below may reference images not displayed]

Biopsy-proven grade 1 invasive mammary carcinoma at the site of the
coil shaped biopsy clip upper outer quadrant.

Lobular neoplasia (atypical lobular hyperplasia and complex
sclerosing lesion) in the upper-outer quadrant of the right breast
with dumbbell clip placed.

Lobular neoplasia (atypical lobular hyperplasia) diagnosed in the
retroareolar right breast with cylinder clip placed.

EXAM:
MAMMOGRAPHIC GUIDED RADIOACTIVE SEED LOCALIZATION OF THE RIGHT
BREAST X 3
FINDINGS: Patient presents for radioactive seed localization prior to
lumpectomy and excisional biopsies. I met with the patient and we
discussed the procedure of seed localization including benefits and
alternatives. We discussed the high likelihood of a successful
procedure. We discussed the risks of the procedure including
infection, bleeding, tissue injury and further surgery. We discussed
the low dose of radioactivity involved in the procedure. Informed,
written consent was given.

The usual time-out protocol was performed immediately prior to the
procedure.

Using mammographic guidance, sterile technique, 1% lidocaine and an
E-Q89 radioactive seed, coil shaped biopsy clip was localized using
a lateral approach. The follow-up mammogram images confirm the seed
in the expected location and were marked for Dr. De.

Follow-up survey of the patient confirms presence of the radioactive
seed.

Order number of E-Q89 seed:  [id number].

Total activity:  0.254 millicuries reference Date: 07 December, 2019

Using mammographic guidance, sterile technique, 1% lidocaine and an
E-Q89 radioactive seed, a dumbbell-shaped biopsy clip in the
anterior to middle third of the slightly outer right breast was
localized using a lateral approach. An approximately 1 cm post
biopsy hematoma is noted at this biopsy site. The follow-up
mammogram images confirm the seed in the expected location and were
marked for Dr. De.

Follow-up survey of the patient confirms presence of the radioactive
seed.

Order number of E-Q89 seed:  [id number].

Total activity:  0.255 millicuries reference Date: 10 December, 2019

Using mammographic guidance, sterile technique, 1% lidocaine and an
E-Q89 radioactive seed, cylindrical shaped biopsy clip in the
retroareolar right breast was localized using a lateral approach.
The follow-up mammogram images confirm the seed in the expected
location and were marked for Dr. De.

Follow-up survey of the patient confirms presence of the radioactive
seed.

Order number of E-Q89 seed:  [id number].

Total activity:  0.254 millicuries reference Date: December 07, 2019

The patient tolerated the procedure well and was released from the
[REDACTED]. She was given instructions regarding seed removal.
IMPRESSION: Radioactive seed localization right breast x 3. No apparent
complications.

## 2021-05-30 ENCOUNTER — Encounter: Payer: Self-pay | Admitting: Obstetrics and Gynecology

## 2021-05-30 ENCOUNTER — Other Ambulatory Visit: Payer: Self-pay

## 2021-05-30 ENCOUNTER — Ambulatory Visit (INDEPENDENT_AMBULATORY_CARE_PROVIDER_SITE_OTHER): Payer: BC Managed Care – PPO | Admitting: Obstetrics and Gynecology

## 2021-05-30 VITALS — BP 138/78 | HR 57 | Ht 64.5 in | Wt 135.0 lb

## 2021-05-30 DIAGNOSIS — R350 Frequency of micturition: Secondary | ICD-10-CM | POA: Diagnosis not present

## 2021-05-30 IMAGING — MG MM BREAST SURGICAL SPECIMEN
1 series · 1 of 1 positions shown · non-contrast
Comparison: Previous exam(s).

CLINICAL DATA: 53-year-old patient had 3 radioactive seeds placed
on December 22, 2019 prior to lumpectomy of a grade 1 invasive mammary
carcinoma and excisional biopsies of 2 high risk lesions.

EXAM:
SPECIMEN RADIOGRAPH OF THE RIGHT BREAST

[R]
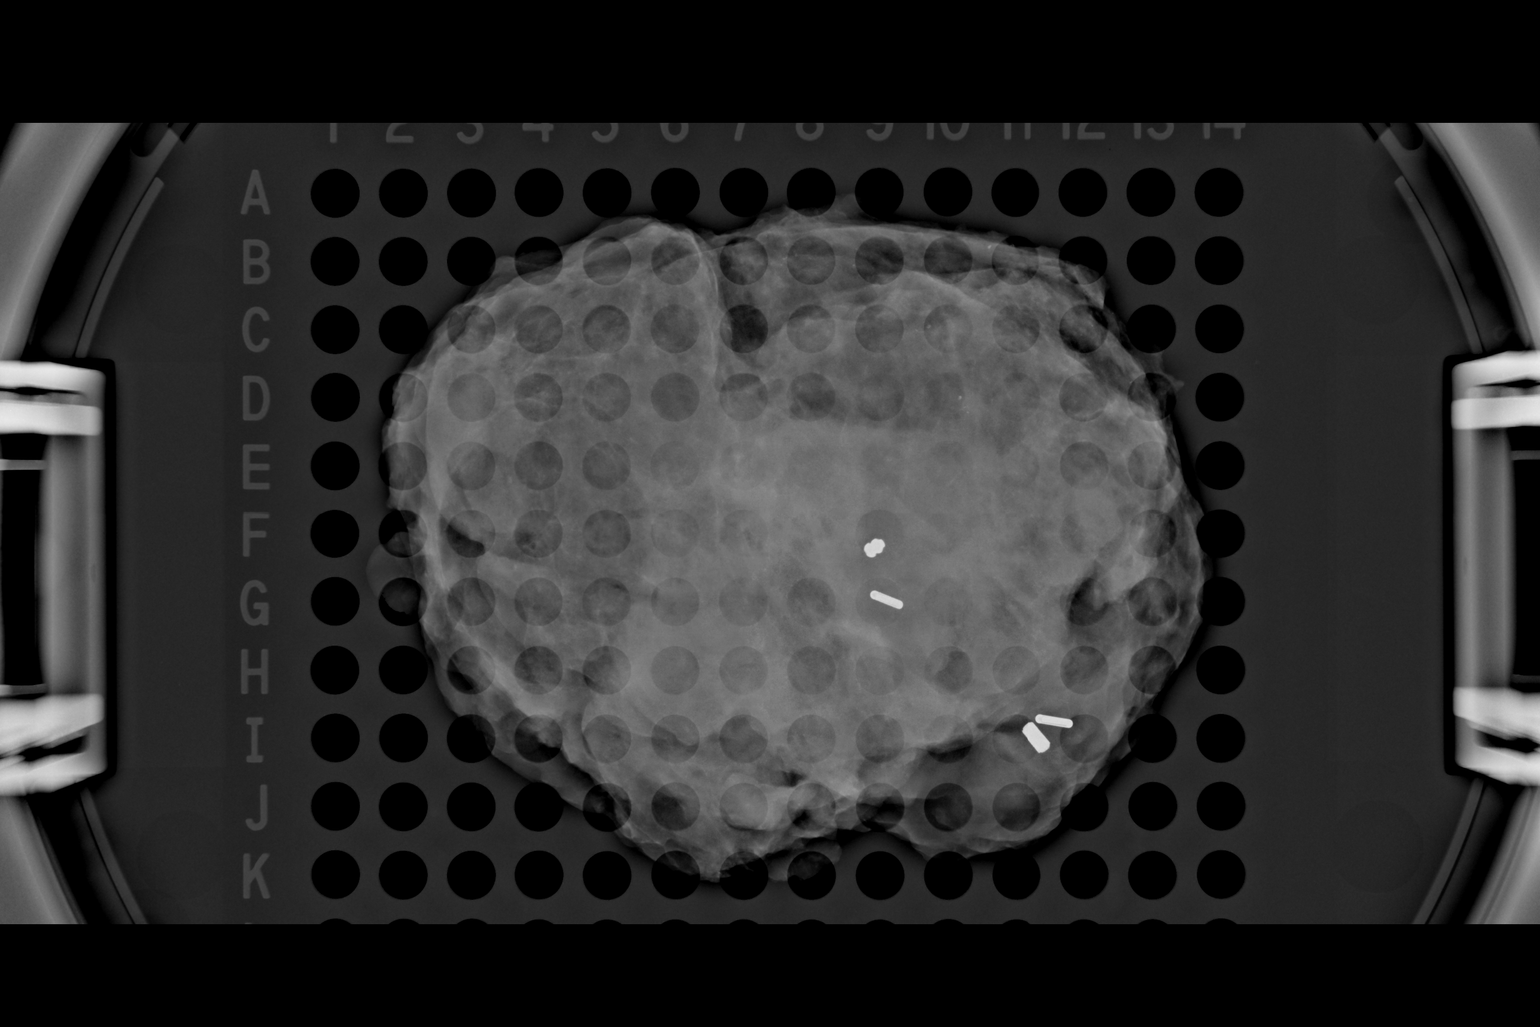

[1 of 1 positions shown; findings below may reference images not displayed]

FINDINGS: Status post excision of the right breast. Three radioactive seeds, a
coil shaped biopsy clip, a dumbbell-shaped biopsy clip, and a
cylindrical shaped biopsy clip are present, completely intact, and
were marked for pathology.
IMPRESSION: Specimen radiograph of the right breast.

## 2021-05-30 IMAGING — MG MM BREAST SURGICAL SPECIMEN
1 series · 1 of 1 positions shown · non-contrast
Comparison: Previous exam(s).

CLINICAL DATA: 53-year-old patient had 3 radioactive seeds placed
on December 22, 2019 plot prior to lumpectomy of a grade 1 invasive
mammary carcinoma and excisional biopsies of 2 high risk lesions

EXAM:
SPECIMEN RADIOGRAPH OF THE RIGHT BREAST

[R]
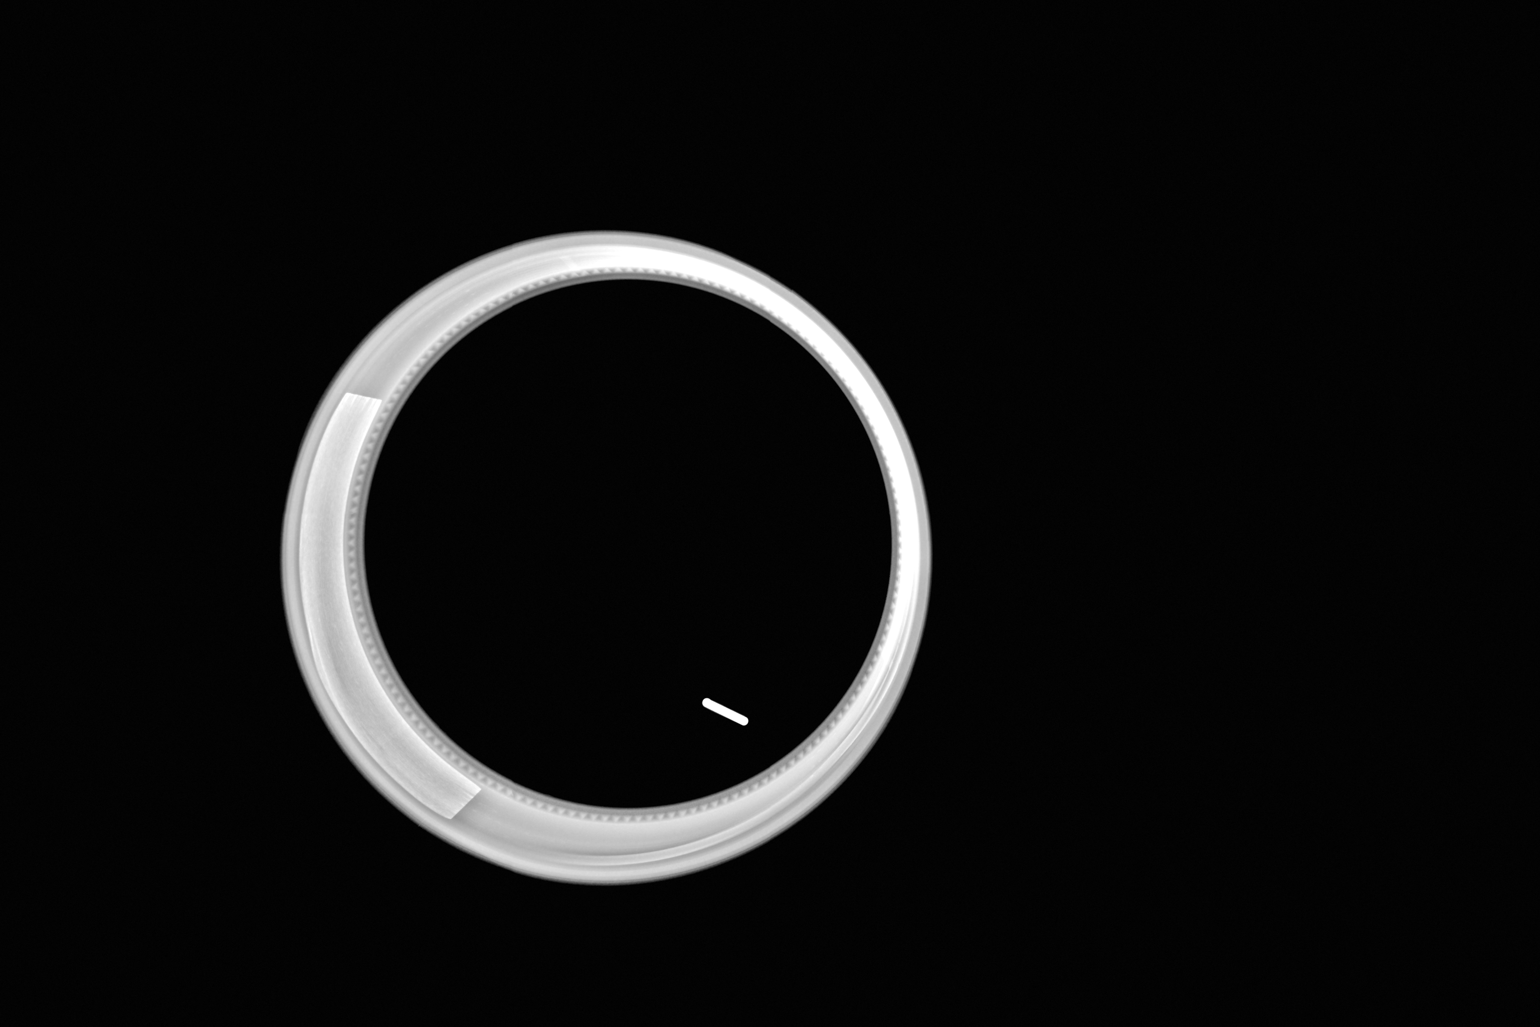

[1 of 1 positions shown; findings below may reference images not displayed]

FINDINGS: Status post excision of the right breast. Three radioactive seeds, a
coil shaped biopsy clip, a dumbbell-shaped biopsy clip, and a
cylindrical shaped biopsy clip are present, completely intact, and
were marked for pathology.
IMPRESSION: Specimen radiographs of the right breast.

## 2021-05-30 IMAGING — MG MM BREAST SURGICAL SPECIMEN
1 series · 1 of 1 positions shown · non-contrast
Comparison: Previous exam(s).

CLINICAL DATA: 53-year-old patient had 3 radioactive seeds placed
on December 22, 2019 plot prior to lumpectomy of a grade 1 invasive
mammary carcinoma and excisional biopsies of 2 high risk lesions

EXAM:
SPECIMEN RADIOGRAPH OF THE RIGHT BREAST

[R]
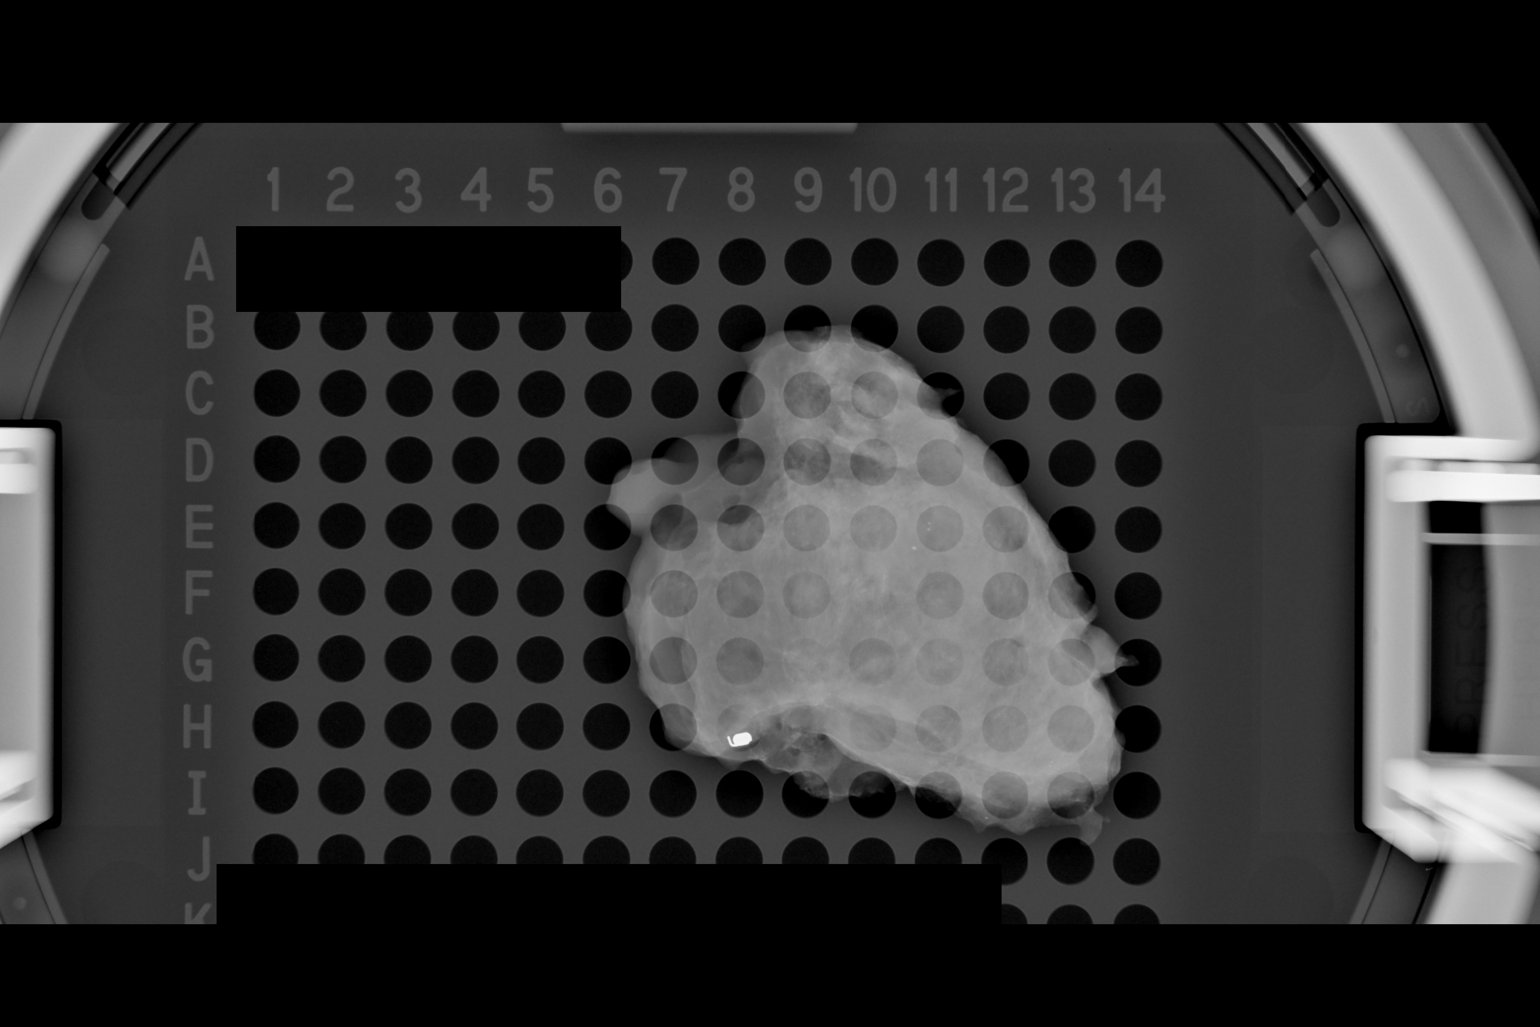

[1 of 1 positions shown; findings below may reference images not displayed]

FINDINGS: Status post excision of the right breast. Three radioactive seeds, a
coil shaped biopsy clip, a dumbbell-shaped biopsy clip, and a
cylindrical shaped biopsy clip are present, completely intact, and
were marked for pathology.
IMPRESSION: Specimen radiographs of the right breast.

## 2021-05-30 NOTE — Patient Instructions (Signed)

## 2021-06-01 LAB — URINALYSIS, COMPLETE W/RFL CULTURE
Bilirubin Urine: NEGATIVE
Glucose, UA: NEGATIVE
Hyaline Cast: NONE SEEN /LPF
Ketones, ur: NEGATIVE
Leukocyte Esterase: NEGATIVE
Nitrites, Initial: NEGATIVE
Protein, ur: NEGATIVE
Specific Gravity, Urine: 1.015 (ref 1.001–1.035)
pH: 7 (ref 5.0–8.0)

## 2021-06-01 LAB — URINE CULTURE
MICRO NUMBER:: 12583556
SPECIMEN QUALITY:: ADEQUATE

## 2021-06-01 LAB — CULTURE INDICATED

## 2021-06-07 ENCOUNTER — Other Ambulatory Visit: Payer: Self-pay | Admitting: Oncology

## 2021-06-10 ENCOUNTER — Other Ambulatory Visit: Payer: Self-pay | Admitting: Internal Medicine

## 2021-07-04 ENCOUNTER — Encounter: Payer: Self-pay | Admitting: Internal Medicine

## 2021-07-04 NOTE — Patient Instructions (Addendum)
Blood work was ordered.     Medications changes include :   none    Please followup in 1 year   Health Maintenance, Female Adopting a healthy lifestyle and getting preventive care are important in promoting health and wellness. Ask your health care provider about: The right schedule for you to have regular tests and exams. Things you can do on your own to prevent diseases and keep yourself healthy. What should I know about diet, weight, and exercise? Eat a healthy diet  Eat a diet that includes plenty of vegetables, fruits, low-fat dairy products, and lean protein. Do not eat a lot of foods that are high in solid fats, added sugars, or sodium. Maintain a healthy weight Body mass index (BMI) is used to identify weight problems. It estimates body fat based on height and weight. Your health care provider can help determine your BMI and help you achieve or maintain a healthy weight. Get regular exercise Get regular exercise. This is one of the most important things you can do for your health. Most adults should: Exercise for at least 150 minutes each week. The exercise should increase your heart rate and make you sweat (moderate-intensity exercise). Do strengthening exercises at least twice a week. This is in addition to the moderate-intensity exercise. Spend less time sitting. Even light physical activity can be beneficial. Watch cholesterol and blood lipids Have your blood tested for lipids and cholesterol at 56 years of age, then have this test every 5 years. Have your cholesterol levels checked more often if: Your lipid or cholesterol levels are high. You are older than 56 years of age. You are at high risk for heart disease. What should I know about cancer screening? Depending on your health history and family history, you may need to have cancer screening at various ages. This may include screening for: Breast cancer. Cervical cancer. Colorectal cancer. Skin cancer. Lung  cancer. What should I know about heart disease, diabetes, and high blood pressure? Blood pressure and heart disease High blood pressure causes heart disease and increases the risk of stroke. This is more likely to develop in people who have high blood pressure readings or are overweight. Have your blood pressure checked: Every 3-5 years if you are 67-33 years of age. Every year if you are 2 years old or older. Diabetes Have regular diabetes screenings. This checks your fasting blood sugar level. Have the screening done: Once every three years after age 73 if you are at a normal weight and have a low risk for diabetes. More often and at a younger age if you are overweight or have a high risk for diabetes. What should I know about preventing infection? Hepatitis B If you have a higher risk for hepatitis B, you should be screened for this virus. Talk with your health care provider to find out if you are at risk for hepatitis B infection. Hepatitis C Testing is recommended for: Everyone born from [birth year] through 1966. Anyone with known risk factors for hepatitis C. Sexually transmitted infections (STIs) Get screened for STIs, including gonorrhea and chlamydia, if: You are sexually active and are younger than 56 years of age. You are older than 56 years of age and your health care provider tells you that you are at risk for this type of infection. Your sexual activity has changed since you were last screened, and you are at increased risk for chlamydia or gonorrhea. Ask your health care provider if you are at risk. Ask your  health care provider about whether you are at high risk for HIV. Your health care provider may recommend a prescription medicine to help prevent HIV infection. If you choose to take medicine to prevent HIV, you should first get tested for HIV. You should then be tested every 3 months for as long as you are taking the medicine. Pregnancy If you are about to stop having your  period (premenopausal) and you may become pregnant, seek counseling before you get pregnant. Take 400 to 800 micrograms (mcg) of folic acid every day if you become pregnant. Ask for birth control (contraception) if you want to prevent pregnancy. Osteoporosis and menopause Osteoporosis is a disease in which the bones lose minerals and strength with aging. This can result in bone fractures. If you are 79 years old or older, or if you are at risk for osteoporosis and fractures, ask your health care provider if you should: Be screened for bone loss. Take a calcium or vitamin D supplement to lower your risk of fractures. Be given hormone replacement therapy (HRT) to treat symptoms of menopause. Follow these instructions at home: Alcohol use Do not drink alcohol if: Your health care provider tells you not to drink. You are pregnant, may be pregnant, or are planning to become pregnant. If you drink alcohol: Limit how much you have to: 0-1 drink a day. Know how much alcohol is in your drink. In the [place] one drink equals one 12 oz bottle of beer (355 mL), one 5 oz glass of wine (148 mL), or one 1 oz glass of hard liquor (44 mL). Lifestyle Do not use any products that contain nicotine or tobacco. These products include cigarettes, chewing tobacco, and vaping devices, such as e-cigarettes. If you need help quitting, ask your health care provider. Do not use street drugs. Do not share needles. Ask your health care provider for help if you need support or information about quitting drugs. General instructions Schedule regular health, dental, and eye exams. Stay current with your vaccines. Tell your health care provider if: You often feel depressed. You have ever been abused or do not feel safe at home. Summary Adopting a healthy lifestyle and getting preventive care are important in promoting health and wellness. Follow your health care provider's instructions about healthy diet, exercising, and  getting tested or screened for diseases. Follow your health care provider's instructions on monitoring your cholesterol and blood pressure. This information is not intended to replace advice given to you by your health care provider. Make sure you discuss any questions you have with your health care provider. Document Revised: 12/04/2020 Document Reviewed: 12/04/2020 Elsevier Patient Education  [place].

## 2021-07-04 NOTE — Progress Notes (Signed)
Subjective:    Patient ID: Alexis Young, female    DOB: 1966/01/04, 55 y.o.   MRN: [id number]   This visit occurred during the SARS-CoV-2 public health emergency.  Safety protocols were in place, including screening questions prior to the visit, additional usage of staff PPE, and extensive cleaning of exam room while observing appropriate contact time as indicated for disinfecting solutions.    HPI She is here for a physical exam.   She continues to work on weight loss.  She is not working and is exercising regularly.  She has no concerns.   Medications and allergies reviewed with patient and updated if appropriate.  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 03/12/2019   Tinnitus of right ear 03/12/2019   Dysfunction of right eustachian tube 06/17/2018   Hyperlipidemia 01/15/2018   Prediabetes 01/15/2018   Palpitations 01/15/2018   Hypothyroidism 07/31/2016   Status post laparoscopic hysterectomy 02/14/2015   Premature ovarian failure 09/21/2013    Current Outpatient Medications on File Prior to Visit  Medication Sig Dispense Refill   gabapentin (NEURONTIN) 300 MG capsule TAKE 1 CAPSULE BY MOUTH EVERYDAY AT BEDTIME 90 capsule 4   levothyroxine (SYNTHROID) 50 MCG tablet TAKE 1 TABLET BY MOUTH EVERY DAY 90 tablet 1   tamoxifen (NOLVADEX) 20 MG tablet TAKE 1 TABLET BY MOUTH EVERY DAY 90 tablet 1   tretinoin (RETIN-A) 0.025 % cream APPLY A PEA SIZE AMOUNT TO AFFECTED AREAS  NIGHTLY.     No current facility-administered medications on file prior to visit.    Past Medical History:  Diagnosis Date   Arthritis    Breast cancer ([place]) 11/02/2019   right breast   Cancer ([place]) 03/2013, 06/2013   malignant melanoma on right arm and back   Complication of anesthesia    Per patient after hysterectomy in 2016, had to stay over an extra night due to low oxygen levels   Dyspareunia    Elevated serum creatinine    Family history of colon cancer    Family history of lung cancer    Family history of melanoma    Family history of pancreatic cancer    Family history of stomach cancer    Family history of uterine cancer    Fibroid    GERD (gastroesophageal reflux disease)    takes protonix as needed    Hypothyroidism    Premature ovarian failure 12/2008   FSH 137.1, started on HRT   Tennis elbow 2017   Right   Tinnitus 2020   both ears    Past Surgical History:  Procedure Laterality Date   ADENOIDECTOMY     per patient as a child   BILATERAL SALPINGECTOMY Bilateral 02/14/2015   Procedure: BILATERAL SALPINGECTOMY OOPHORECTOMY;  Surgeon: Nunzio Cobbs, MD;  Location: [facility];  Service: Gynecology;  Laterality: Bilateral;   BREAST BIOPSY  1987   Dr. Rosana Hoes   BREAST EXCISIONAL BIOPSY Left  benign   BREAST LUMPECTOMY WITH RADIOACTIVE SEED AND SENTINEL LYMPH NODE BIOPSY Right 12/23/2019   Procedure: RIGHT BREAST LUMPECTOMY X 3 WITH RADIOACTIVE SEED AND SENTINEL LYMPH NODE MAPPING;  Surgeon: Erroll Luna, MD;  Location: [facility];  Service: General;  Laterality: Right;   BREAST SURGERY Right 04/2010   breast biopsy for multiple fibroadenoma - no atypia   CATARACT EXTRACTION, BILATERAL     CESAREAN SECTION     CRYOTHERAPY  5/91   for persistent condyloma atypia   CYSTOSCOPY N/A 02/14/2015   Procedure: CYSTOSCOPY;  Surgeon: Nunzio Cobbs, MD;  Location: [facility];  Service: Gynecology;  Laterality: N/A;   LASER ABLATION OF CONDYLOMAS  03/05/90 Dr.  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   MELANOMA EXCISION Right 03/2013   melanoma right arm   ROBOTIC ASSISTED TOTAL HYSTERECTOMY N/A 02/14/2015   Procedure: ROBOTIC ASSISTED TOTAL HYSTERECTOMY;  Surgeon: Nunzio Cobbs, MD;  Location: [facility];  Service: Gynecology;  Laterality: N/A;   SKIN CANCER EXCISION  06/2013   midddle of back with path report of atypia   TUBAL LIGATION Bilateral    WISDOM TOOTH EXTRACTION      Social History   Socioeconomic History   Marital status: Married    Spouse name: Not on file   Number of children: 1   Years of education: Not on file   Highest education level: Not on file  Occupational History    Employer: [employer]  Tobacco Use   Smoking status: Never   Smokeless tobacco: Never  Vaping Use   Vaping Use: Never used  Substance and Sexual Activity   Alcohol use: No   Drug use: No   Sexual activity: Yes    Partners: Male    Birth control/protection: Surgical    Comment: Hysterectomy  Other Topics Concern   Not on file  Social History Narrative   Not on file   Social Determinants of Health   Financial Resource Strain: Not on file  Food Insecurity: Not on file  Transportation Needs: Not on file  Physical Activity: Not on file  Stress: Not on file  Social Connections: Not on file    Family History  Problem Relation Age of Onset   Cancer Mother        lung and liver   Alcohol abuse Father        Cirrhosis of liver   Cancer Maternal Aunt        stomach cancer   Cancer Maternal Grandmother        uterine cancer   Heart failure Paternal Grandmother    Lung cancer Maternal Uncle    Lung cancer Maternal Uncle    Pancreatic cancer Paternal Uncle    Testicular cancer Cousin    Melanoma Cousin        of eye   Cancer Cousin        possible colon? dx 50s   Colon cancer Other    Breast cancer Cousin     Review of Systems  Constitutional:  Negative for chills and fever.  Eyes:  Negative for visual  disturbance.  Respiratory:  Negative for cough, shortness of breath and wheezing.   Cardiovascular:  Negative for chest pain, palpitations and leg swelling.  Gastrointestinal:  Negative for abdominal pain, blood in stool, constipation, diarrhea and nausea.       No gerd  Genitourinary:  Negative for dysuria and hematuria.  Musculoskeletal:  Positive  for arthralgias (mild hand OA). Negative for back pain.  Skin:  Negative for color change and rash.  Neurological:  Negative for light-headedness and headaches.  Psychiatric/Behavioral:  Negative for dysphoric mood. The patient is not nervous/anxious.       Objective:   Vitals:   07/05/21 0904  BP: 122/70  Pulse: (!) 58  Temp: 97.9 F (36.6 C)  SpO2: 97%   Filed Weights   07/05/21 0904  Weight: 133 lb (60.3 kg)   Body mass index is 22.48 kg/m.  BP Readings from Last 3 Encounters:  07/05/21 122/70  05/30/21 138/78  12/04/20 (!) 158/90    Wt Readings from Last 3 Encounters:  07/05/21 133 lb (60.3 kg)  05/30/21 135 lb (61.2 kg)  12/04/20 148 lb 11.2 oz (67.4 kg)    Depression screen [facility] 2/9 07/05/2021 06/29/2020 06/29/2019  Decreased Interest 0 0 0  Down, Depressed, Hopeless 0 1 0  PHQ - 2 Score 0 1 0  Altered sleeping 3 - -  Tired, decreased energy 0 - -  Change in appetite 0 - -  Feeling bad or failure about yourself  0 - -  Trouble concentrating 0 - -  Moving slowly or fidgety/restless 0 - -  Suicidal thoughts 0 - -  PHQ-9 Score 3 - -  Difficult doing work/chores Not difficult at all - -  Some recent data might be hidden     GAD 7 : Generalized Anxiety Score 07/05/2021  Nervous, Anxious, on Edge 0  Control/stop worrying 0  Worry too much - different things 0  Trouble relaxing 0  Restless 0  Easily annoyed or irritable 0  Afraid - awful might happen 0  Total GAD 7 Score 0       Physical Exam Constitutional: She appears well-developed and well-nourished. No distress.  HENT:  Head: Normocephalic and  atraumatic.  Right Ear: External ear normal. Normal ear canal and TM Left Ear: External ear normal.  Normal ear canal and TM Mouth/Throat: Oropharynx is clear and moist.  Eyes: Conjunctivae and EOM are normal.  Neck: Neck supple. No tracheal deviation present. No thyromegaly present.  No carotid bruit  Cardiovascular: Normal rate, regular rhythm and normal heart sounds.   No murmur heard.  No edema. Pulmonary/Chest: Effort normal and breath sounds normal. No respiratory distress. She has no wheezes. She has no rales.  Breast: deferred   Abdominal: Soft. She exhibits no distension. There is no tenderness.  Lymphadenopathy: She has no cervical adenopathy.  Skin: Skin is warm and dry. She is not diaphoretic.  Psychiatric: She has a normal mood and affect. Her behavior is normal.     Lab Results  Component Value Date   WBC 3.3 (L) 05/01/2021   HGB 11.9 (L) 05/01/2021   HCT 36.5 05/01/2021   PLT 143 (L) 05/01/2021   GLUCOSE 92 05/01/2021   CHOL 197 06/29/2020   TRIG 115.0 06/29/2020   HDL 59.90 06/29/2020   LDLDIRECT 167.0 06/29/2019   LDLCALC 114 (H) 06/29/2020   ALT 14 05/01/2021   AST 18 05/01/2021   NA 141 05/01/2021   K 3.8 05/01/2021   CL 108 05/01/2021   CREATININE 0.90 05/01/2021   BUN 14 05/01/2021   CO2 27 05/01/2021   TSH 1.71 06/29/2020   HGBA1C 5.5 06/29/2020         Assessment & Plan:   Physical exam: Screening blood work  ordered Exercise  walking 5 miles a day, 20 minute video 3 days a week  Weight  continues to lose weight - has cut back on sugar Substance abuse  none   Screened for depression using the PHQ 9 scale.  No evidence of depression.   Screened for anxiety using GAD7 Scale.  No evidence of anxiety.   Reviewed recommended immunizations.   Health Maintenance  Topic Date Due   COVID-19 Vaccine (1) 07/21/2021 (Originally 07/01/1966)   Zoster Vaccines- Shingrix (1 of 2) 10/03/2021 (Originally 12/31/1983)   INFLUENZA VACCINE  10/26/2021  (Originally 02/26/2021)   MAMMOGRAM  11/08/2022   DEXA SCAN  10/13/2024   COLONOSCOPY (Pts 45-108yrs Insurance coverage will need to be confirmed)  03/22/2026   TETANUS/TDAP  01/14/2027   Hepatitis C Screening  Completed   HIV Screening  Completed   HPV VACCINES  Aged Out   Pneumococcal Vaccine 72-14 Years old  Discontinued          See Problem List for Assessment and Plan of chronic medical problems.

## 2021-07-05 ENCOUNTER — Ambulatory Visit (INDEPENDENT_AMBULATORY_CARE_PROVIDER_SITE_OTHER): Payer: BC Managed Care – PPO | Admitting: Internal Medicine

## 2021-07-05 ENCOUNTER — Other Ambulatory Visit: Payer: Self-pay

## 2021-07-05 VITALS — BP 122/70 | HR 58 | Temp 97.9°F | Ht 64.5 in | Wt 133.0 lb

## 2021-07-05 DIAGNOSIS — E782 Mixed hyperlipidemia: Secondary | ICD-10-CM

## 2021-07-05 DIAGNOSIS — Z0000 Encounter for general adult medical examination without abnormal findings: Secondary | ICD-10-CM

## 2021-07-05 DIAGNOSIS — Z1331 Encounter for screening for depression: Secondary | ICD-10-CM

## 2021-07-05 DIAGNOSIS — E039 Hypothyroidism, unspecified: Secondary | ICD-10-CM

## 2021-07-05 DIAGNOSIS — R7303 Prediabetes: Secondary | ICD-10-CM | POA: Diagnosis not present

## 2021-07-05 DIAGNOSIS — Z170 Estrogen receptor positive status [ER+]: Secondary | ICD-10-CM

## 2021-07-05 DIAGNOSIS — C50411 Malignant neoplasm of upper-outer quadrant of right female breast: Secondary | ICD-10-CM

## 2021-07-05 DIAGNOSIS — G479 Sleep disorder, unspecified: Secondary | ICD-10-CM

## 2021-07-05 LAB — COMPREHENSIVE METABOLIC PANEL
ALT: 15 U/L (ref 0–35)
AST: 18 U/L (ref 0–37)
Albumin: 4.2 g/dL (ref 3.5–5.2)
Alkaline Phosphatase: 47 U/L (ref 39–117)
BUN: 11 mg/dL (ref 6–23)
CO2: 28 mEq/L (ref 19–32)
Calcium: 9.2 mg/dL (ref 8.4–10.5)
Chloride: 108 mEq/L (ref 96–112)
Creatinine, Ser: 0.93 mg/dL (ref 0.40–1.20)
GFR: 68.71 mL/min (ref >60.00)
Glucose, Bld: 87 mg/dL (ref 70–99)
Potassium: 4.2 mEq/L (ref 3.5–5.1)
Sodium: 141 mEq/L (ref 135–145)
Total Bilirubin: 0.6 mg/dL (ref 0.2–1.2)
Total Protein: 6.9 g/dL (ref 6.0–8.3)

## 2021-07-05 LAB — CBC WITH DIFFERENTIAL/PLATELET
Basophils Absolute: 0 10*3/uL (ref 0.0–0.1)
Basophils Relative: 1.2 % (ref 0.0–3.0)
Eosinophils Absolute: 0.1 10*3/uL (ref 0.0–0.7)
Eosinophils Relative: 1.6 % (ref 0.0–5.0)
HCT: 37.6 % (ref 36.0–46.0)
Hemoglobin: 12.4 g/dL (ref 12.0–15.0)
Lymphocytes Relative: 29.5 % (ref 12.0–46.0)
Lymphs Abs: 1 10*3/uL (ref 0.7–4.0)
MCHC: 32.9 g/dL (ref 30.0–36.0)
MCV: 86.2 fl (ref 78.0–100.0)
Monocytes Absolute: 0.2 10*3/uL (ref 0.1–1.0)
Monocytes Relative: 7.2 % (ref 3.0–12.0)
Neutro Abs: 2 10*3/uL (ref 1.4–7.7)
Neutrophils Relative %: 60.5 % (ref 43.0–77.0)
Platelets: 140 10*3/uL — ABNORMAL LOW (ref 150.0–400.0)
RBC: 4.36 Mil/uL (ref 3.87–5.11)
RDW: 13.3 % (ref 11.5–15.5)
WBC: 3.3 10*3/uL — ABNORMAL LOW (ref 4.0–10.5)

## 2021-07-05 LAB — LIPID PANEL
Cholesterol: 197 mg/dL (ref 0–200)
HDL: 67.2 mg/dL (ref >39.00)
LDL Cholesterol: 112 mg/dL — ABNORMAL HIGH (ref 0–99)
NonHDL: 130.05
Total CHOL/HDL Ratio: 3
Triglycerides: 88 mg/dL (ref 0.0–149.0)
VLDL: 17.6 mg/dL (ref 0.0–40.0)

## 2021-07-05 LAB — HEMOGLOBIN A1C: Hgb A1c MFr Bld: 5.5 % (ref 4.6–6.5)

## 2021-07-05 LAB — TSH: TSH: 3.81 u[IU]/mL (ref 0.35–5.50)

## 2021-07-05 NOTE — Assessment & Plan Note (Addendum)
Chronic Check a1c Low sugar / carb diet Continue regular exercise 

## 2021-07-05 NOTE — Assessment & Plan Note (Signed)
Chronic Following with Dr. Jana Hakim On tamoxifen Also taking gabapentin for associated hot flashes

## 2021-07-05 NOTE — Assessment & Plan Note (Addendum)
Chronic Trazodone was not effective Currently taking gabapentin 300 mg at bedtime - helps some but still does not sleep well

## 2021-07-05 NOTE — Assessment & Plan Note (Addendum)
Chronic  Clinically euthyroid Has lost weight from exercise, eating less Currently taking levothyroxine 50 mcg daily Check tsh  Titrate med dose if needed

## 2021-07-05 NOTE — Assessment & Plan Note (Addendum)
Chronic Regular exercise and healthy diet has helped her lose weight - will continue Check lipid panel, CMP   Currently diet controlled

## 2021-09-27 ENCOUNTER — Other Ambulatory Visit: Payer: Self-pay | Admitting: Internal Medicine

## 2021-09-27 ENCOUNTER — Other Ambulatory Visit: Payer: Self-pay | Admitting: Hematology and Oncology

## 2021-09-27 DIAGNOSIS — Z9889 Other specified postprocedural states: Secondary | ICD-10-CM

## 2021-10-23 ENCOUNTER — Telehealth: Payer: Self-pay

## 2021-10-23 ENCOUNTER — Encounter: Payer: Self-pay | Admitting: Adult Health

## 2021-10-23 ENCOUNTER — Other Ambulatory Visit: Payer: Self-pay

## 2021-10-23 ENCOUNTER — Inpatient Hospital Stay: Payer: BC Managed Care – PPO | Attending: Adult Health | Admitting: Adult Health

## 2021-10-23 VITALS — BP 124/76 | HR 73 | Temp 98.1°F | Resp 18 | Ht 64.0 in | Wt 135.7 lb

## 2021-10-23 DIAGNOSIS — N63 Unspecified lump in unspecified breast: Secondary | ICD-10-CM | POA: Diagnosis not present

## 2021-10-23 DIAGNOSIS — Z170 Estrogen receptor positive status [ER+]: Secondary | ICD-10-CM | POA: Insufficient documentation

## 2021-10-23 DIAGNOSIS — C50411 Malignant neoplasm of upper-outer quadrant of right female breast: Secondary | ICD-10-CM | POA: Insufficient documentation

## 2021-10-23 DIAGNOSIS — Z923 Personal history of irradiation: Secondary | ICD-10-CM | POA: Insufficient documentation

## 2021-10-23 DIAGNOSIS — Z79810 Long term (current) use of selective estrogen receptor modulators (SERMs): Secondary | ICD-10-CM | POA: Diagnosis not present

## 2021-10-23 NOTE — Telephone Encounter (Signed)
Called pt to update medication list. ?

## 2021-10-23 NOTE — Telephone Encounter (Signed)
Called pt to advise of imaging apt with [facility] on 10/25/21 @ 9:45 AM. My chart message sent to pt with instructions, address, date and time. ?

## 2021-10-23 NOTE — Assessment & Plan Note (Addendum)
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  She has completed lumpectomy, adjuvant radiation, and began on tamoxifen in September 2021. ? ?Alexis Young has a new breast nodule that requires work up and evaluation.  I placed orders for mammogram and ultrasound to further evaluate.   ? ?Alexis Young will continue on Tamoxifen and we will f/u with her after the results.   ?

## 2021-10-23 NOTE — Progress Notes (Signed)
[facility] Follow up: ?  ? ?Binnie Rail, MD ?[place] [address] ? ? ?DIAGNOSIS:  Cancer Staging  ?Malignant neoplasm of upper-outer quadrant of right breast in female, estrogen receptor positive ([place]) ?Staging form: Breast, AJCC 8th Edition ?- Clinical stage from 11/10/2019: Stage Unknown (cTX, cN0, cM0, G1, ER+, PR+, HER2-) - Unsigned ?Stage prefix: Initial diagnosis ?Histologic grading system: 3 grade system ?Laterality: Right ?Staged by: Pathologist and managing physician ?Stage used in treatment planning: Yes ?National guidelines used in treatment planning: Yes ?Type of national guideline used in treatment planning: NCCN ?- Pathologic stage from 12/23/2019: Stage IA (pT1a, pN0, cM0, G2, ER+, PR+, HER2-) - Signed by Gardenia Phlegm, NP on 01/12/2020 ?Stage prefix: Initial diagnosis ?Histologic grading system: 3 grade system ? ? ?SUMMARY OF ONCOLOGIC HISTORY: ?Per Dr. Jana Hakim ?[place]ear[place], [place] woman status post right breast upper outer quadrant biopsy 11/01/2019 for a clinical [place] N0 invasive lobular carcinoma, grade 1, estrogen and progesterone receptor positive, HER-2 not amplified, with an MIB-1 of 2%. ?            (a) breast MRI 11/22/2019 showed 2 additional right breast lesions and one left breast lesion, all of which were biopsied and all of which were benign ?  ?(1) status post right lumpectomy 12/23/2019 for a pT1a pN0, stage IA invasive lobular breast cancer, grade 2, with negative margins. ?            (a) a total of 2 axillary lymph nodes were removed ?  ?(2) adjuvant radiation Radiation Treatment Dates: 01/24/2020 through 02/21/2020 ?Site Technique Total Dose (Gy) Dose per Fx (Gy) Completed Fx Beam Energies  ?Breast, Right: Breast_Rt 3D 40.05/40.05 2.67 15/15 6X, 10X  ?Breast, Right: Breast_Rt_Bst 3D 10/10 2 5/5 6X, 10X  ?  ?  ?(3) genetic testing 11/19/2019 through the [facility] Common Hereditary Cancers + Melanoma panel found no deleterious mutations in  APC, ATM, AXIN2, BARD1, BMPR1A, BRCA1, BRCA2, BRIP1, CDH1, CDKN2A (p14ARF), CDKN2A (p16INK4a), CKD4, CHEK2, CTNNA1, DICER1, EPCAM (Deletion/duplication testing only), GREM1 (promoter region deletion/duplication testing only), KIT, MEN1, MLH1, MSH2, MSH3, MSH6, MUTYH, NBN, NF1, NHTL1, PALB2, PDGFRA, PMS2, POLD1, POLE, PTEN, RAD50, RAD51C, RAD51D, RNF43, SDHB, SDHC, SDHD, SMAD4, SMARCA4. STK11, TP53, TSC1, TSC2, and VHL.  The following genes were evaluated for sequence changes only: SDHA and HOXB13 c.251G>A variant only. ?            (a) The [facility] Melanoma Panel analyzed the following 9 genes: BAP1 BRCA2 CDK4 CDKN2A MITF POT1 PTEN RB1 Tp53. ?            (b) VUS noted in APC, POLE, and RNF43 ?  ?(4) started tamoxifen September 2021 ?            (a) status post hysterectomy with bilateral salpingo-oophorectomy ?(5) persistent mild leukopenia ?            (a) baseline ferritin, serum iron, folate, and B12 within normal limits March/April 2020; also negative ANA and normal sedimentation rate 09/28/2020 ?            (b) review of peripheral blood film 12/04/2020 unremarkable ? ?CURRENT THERAPY: Tamoxifen ? ?INTERVAL HISTORY: ?Kerisha N Proud 56 y.o. female returns for follow-up and evaluation of new breast change with a lump in her right breast where she had her surgery.  She noticed this about a week ago.  ? ?Her most recent mammogram was completed on November 07, 2020 and showed no evidence of malignancy  and breast density category C. ? ?Daley continues on tamoxifen and notes her main issue has been hot flashes, however she has continued taking it daily. ? ? ?Patient Active Problem List  ? Diagnosis Date Noted  ? Leukopenia 07/03/2020  ? Genetic testing 11/19/2019  ? Family history of stomach cancer   ? Family history of uterine cancer   ? Family history of colon cancer   ? Family history of melanoma   ? Family history of pancreatic cancer   ? Family history of lung cancer   ? Malignant neoplasm of upper-outer quadrant  of right breast in female, estrogen receptor positive ([place]) 11/04/2019  ? Sleep difficulties 06/29/2019  ? 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 Tinnitus of right ear 03/12/2019  ? Dysfunction of right eustachian tube 06/17/2018  ? Hyperlipidemia 01/15/2018  ? Prediabetes 01/15/2018  ? Palpitations 01/15/2018  ? Hypothyroidism 07/31/2016  ? Status post laparoscopic hysterectomy 02/14/2015  ? Premature ovarian failure 09/21/2013  ? ? ?is allergic to tetracyclines & related, asa [aspirin], and sulfa antibiotics. ? ?MEDICAL HISTORY: ?Past Medical History:  ?Diagnosis Date  ? Arthritis   ? Breast cancer ([place]) 11/02/2019  ? right breast  ? Cancer ([place]) 03/2013, 06/2013  ? malignant melanoma on right arm and back  ? Complication of anesthesia   ? Per patient after hysterectomy in 2016, had to stay over an extra night due to low oxygen levels  ? Dyspareunia   ? Elevated serum creatinine   ? Family history of colon cancer   ? Family history of lung cancer   ? Family history of melanoma   ? Family history of pancreatic cancer   ? Family history of stomach cancer   ? Family history of uterine cancer   ? Fibroid   ? GERD (gastroesophageal reflux disease)   ? takes protonix as needed   ? Hypothyroidism   ? Premature ovarian failure 12/2008  ? FSH 137.1, started on HRT  ? Tennis elbow 2017  ? Right  ? Tinnitus 2020  ? both ears  ? ? ?SURGICAL HISTORY: ?Past Surgical History:  ?Procedure Laterality Date  ? ADENOIDECTOMY    ? per patient as a child  ? BILATERAL SALPINGECTOMY Bilateral 02/14/2015  ? Procedure: BILATERAL SALPINGECTOMY OOPHORECTOMY;  Surgeon: Nunzio Cobbs, MD;  Location: [facility];  Service: Gynecology;  Laterality: Bilateral;  ? BREAST BIOPSY  1987  ? Dr. Rosana Hoes  ? BREAST EXCISIONAL BIOPSY Left   ? benign  ? BREAST LUMPECTOMY WITH RADIOACTIVE SEED AND SENTINEL LYMPH NODE BIOPSY Right 12/23/2019  ? Procedure: RIGHT BREAST LUMPECTOMY X 3 WITH RADIOACTIVE  SEED AND SENTINEL LYMPH NODE MAPPING;  Surgeon: Erroll Luna, MD;  Location: [facility];  Service: General;  Laterality: Right;  ? BREAST SURGERY Right 04/2010  ? breast biopsy for multiple fibroadenoma - no atypia  ? CATARACT EXTRACTION, BILATERAL    ? CESAREAN SECTION    ? CRYOTHERAPY  5/91  ? for persistent condyloma atypia  ? CYSTOSCOPY N/A 02/14/2015  ? Procedure: CYSTOSCOPY;  Surgeon: Nunzio Cobbs, MD;  Location: [facility];  Service: Gynecology;  Laterality: N/A;  ? LASER ABLATION OF CONDYLOMAS  03/05/90 Dr. Warnell Forester  ? perineal area- also had Bowens disease  ? MELANOMA EXCISION Right 03/2013  ? melanoma right arm  ? ROBOTIC ASSISTED TOTAL HYSTERECTOMY N/A 02/14/2015  ? Procedure: ROBOTIC ASSISTED TOTAL HYSTERECTOMY;  Surgeon: Nunzio Cobbs,  MD;  Location: [facility];  Service: Gynecology;  Laterality: N/A;  ? SKIN CANCER EXCISION  06/2013  ? midddle of back with path report of atypia  ? TUBAL LIGATION Bilateral   ? WISDOM TOOTH EXTRACTION    ? ? ?SOCIAL HISTORY: ?Social History  ? ?Socioeconomic History  ? Marital status: Married  ?  Spouse name: Not on file  ? Number of children: 1  ? Years of education: Not on file  ? Highest education level: Not on file  ?Occupational History  ?  Employer: [employer]  ?Tobacco Use  ? Smoking status: Never  ? Smokeless tobacco: Never  ?Vaping Use  ? Vaping Use: Never used  ?Substance and Sexual Activity  ? Alcohol use: No  ? Drug use: No  ? Sexual activity: Yes  ?  Partners: Male  ?  Birth control/protection: Surgical  ?  Comment: Hysterectomy  ?Other Topics Concern  ? Not on file  ?Social History Narrative  ? Not on file  ? ?Social Determinants of Health  ? ?Financial Resource Strain: Not on file  ?Food Insecurity: Not on file  ?Transportation Needs: Not on file  ?Physical Activity: Not on file  ?Stress: Not on file  ?Social Connections: Not on file  ?Intimate Partner Violence: Not on file  ? ? ?FAMILY HISTORY: ?Family History  ?Problem Relation Age of  Onset  ? Cancer Mother   ?     lung and liver  ? Alcohol abuse Father   ?     Cirrhosis of liver  ? Cancer Maternal Aunt   ?     stomach cancer  ? Cancer Maternal Grandmother   ?     uterine cancer  ? Heart

## 2021-10-25 LAB — HM MAMMOGRAPHY

## 2021-10-26 ENCOUNTER — Encounter: Payer: Self-pay | Admitting: Obstetrics and Gynecology

## 2021-10-27 HISTORY — PX: BREAST BIOPSY: SHX20

## 2021-10-29 ENCOUNTER — Other Ambulatory Visit: Payer: Self-pay

## 2021-10-29 DIAGNOSIS — Z170 Estrogen receptor positive status [ER+]: Secondary | ICD-10-CM

## 2021-11-07 ENCOUNTER — Other Ambulatory Visit: Payer: Self-pay

## 2021-11-07 ENCOUNTER — Encounter: Payer: Self-pay | Admitting: Hematology and Oncology

## 2021-11-08 ENCOUNTER — Encounter: Payer: Self-pay | Admitting: Internal Medicine

## 2021-11-08 NOTE — Progress Notes (Signed)
Outside notes received. Information abstracted. Notes sent to scan.  

## 2021-11-09 ENCOUNTER — Encounter: Payer: Self-pay | Admitting: Obstetrics and Gynecology

## 2021-11-20 NOTE — Progress Notes (Signed)
? ?Patient Care Team: ?Binnie Rail, MD as PCP - General (Internal Medicine) ?Gery Pray, MD as Consulting Physician (Radiation Oncology) ?Erroll Luna, MD as Consulting Physician (General Surgery) ?Nunzio Cobbs, MD as Consulting Physician (Obstetrics and Gynecology) ?Hyatt, Max T, DPM as Veterinary surgeon) ?Harriett Sine, MD as Consulting Physician (Dermatology) ?Juanita Craver, MD as Consulting Physician (Gastroenterology) ? ?DIAGNOSIS:  ?Encounter Diagnosis  ?Name Primary?  ? Malignant neoplasm of upper-outer quadrant of right breast in female, estrogen receptor positive ([place])   ? ? ?SUMMARY OF ONCOLOGIC HISTORY: ?Oncology History  ?Malignant neoplasm of upper-outer quadrant of right breast in female, estrogen receptor positive ([place])  ?11/04/2019 Initial Diagnosis  ? Malignant neoplasm of upper-outer quadrant of right breast in female, estrogen receptor positive ([place]) ?  ? Genetic Testing  ? Negative genetic testing. No pathogenic variants identified on the [facility] Common Hereditary Cancers + Melanoma panel. VUS in APC, POLE, and RNF43 identified. The report date is 11/19/2019. ? ?The Common Hereditary Cancers Panel + Melanoma panel offered by [facility] includes sequencing and/or deletion duplication testing of the following 53 genes: APC*, ATM*, AXIN2, BAP1, BARD1, BMPR1A, BRCA1, BRCA2, BRIP1, CDH1, CDK4, CDKN2A (p14ARF), CDKN2A (p16INK4a), CHEK2, CTNNA1, DICER1*, EGFR, EPCAM*, GREM1*, HOXB13, KIT, MEN1*, MITF*, MLH1*, MSH2*, MSH3*, MSH6*, MUTYH, NBN, NF1*, NTHL1, PALB2, PDGFRA, PMS2*, POLD1*, POLE, POT1, PTEN*, RAD50, RAD51C, RAD51D, RB1*, RNF43, SDHA*, ?SDHB, SDHC*, SDHD, SMAD4, SMARCA4, STK11, TP53, TSC1*, TSC2, VHL. ?  ?12/23/2019 Cancer Staging  ? Staging form: Breast, AJCC 8th Edition ?- Pathologic stage from 12/23/2019: Stage IA (pT1a, pN0, cM0, G2, ER+, PR+, HER2-) - Signed by Gardenia Phlegm, NP on 01/12/2020 ? ?  ? ? ?CHIEF COMPLIANT: Estrogen receptor positive  breast cancer on Tamoxifen ?  ? ?INTERVAL HISTORY: Alexis Young is a 56 y.o. with the above mention Estrogen receptor positive breast cancer. She presents to the clinic today for a follow-up currently on Tamoxifen. ?  ? ? ?ALLERGIES:  is allergic to tetracyclines & related, asa [aspirin], and sulfa antibiotics. ? ?MEDICATIONS:  ?Current Outpatient Medications  ?Medication Sig Dispense Refill  ? gabapentin (NEURONTIN) 300 MG capsule TAKE 1 CAPSULE BY MOUTH EVERYDAY AT BEDTIME 90 capsule 4  ? latanoprost (XALATAN) 0.005 % ophthalmic solution Place 1 drop into both eyes at bedtime.    ? levothyroxine (SYNTHROID) 50 MCG tablet TAKE 1 TABLET BY MOUTH EVERY DAY 90 tablet 1  ? tamoxifen (NOLVADEX) 20 MG tablet Take 1 tablet (20 mg total) by mouth daily. 90 tablet 2  ? tretinoin (RETIN-A) 0.025 % cream APPLY A PEA SIZE AMOUNT TO AFFECTED AREAS NIGHTLY.    ? ?No current facility-administered medications for this visit.  ? ? ?PHYSICAL EXAMINATION: ?ECOG PERFORMANCE STATUS: 1 - Symptomatic but completely ambulatory ? ?There were no vitals filed for this visit. ?There were no vitals filed for this visit. ? ?BREAST: No palpable masses or nodules in either right or left breasts. No palpable axillary supraclavicular or infraclavicular adenopathy no breast tenderness or nipple discharge. (exam performed in the presence of a chaperone) ? ?LABORATORY DATA:  ?I have reviewed the data as listed ? ?  Latest Ref Rng & Units 07/05/2021  ?  9:44 AM 05/01/2021  ?  8:32 AM 03/28/2021  ?  9:08 AM  ?CMP  ?Glucose 70 - 99 mg/dL 87   92   92    ?BUN 6 - 23 mg/dL _0 ?Creatinine 0.40 - 1.20 mg/dL 0.93  0.90   0.97    ?Sodium 135 - 145 mEq/L 141   141   142    ?Potassium 3.5 - 5.1 mEq/L 4.2   3.8   3.9    ?Chloride 96 - 112 mEq/L 108   108   108    ?CO2 19 - 32 mEq/L 28   27   26    ?Calcium 8.4 - 10.5 mg/dL 9.2   9.1   9.2    ?Total Protein 6.0 - 8.3 g/dL 6.9   7.0   7.1    ?Total Bilirubin 0.2 - 1.2 mg/dL 0.6   0.8   0.9     ?Alkaline Phos 39 - 117 U/L 47   51   54    ?AST 0 - 37 U/L 18   18   17    ?ALT 0 - 35 U/L 15   14   13    ? ? ?Lab Results  ?Component Value Date  ? WBC 3.1 (L) 12/04/2021  ? HGB 11.6 (L) 12/04/2021  ? HCT 35.3 (L) 12/04/2021  ? MCV 88.0 12/04/2021  ? PLT 159 12/04/2021  ? NEUTROABS 1.4 (L) 12/04/2021  ? ? ?ASSESSMENT & PLAN:  ?Malignant neoplasm of upper-outer quadrant of right breast in female, estrogen receptor positive ([facility]) ?11/01/2019: Right breast UOQ grade 1 ILC ER/PR positive HER2 negative Ki-67 2%, breast MRI 2 additional right breast lesions and one left breast lesion: Benign ?12/23/2019: Right lumpectomy: Stage Ia ILC grade 2, margins negative, 0/2 lymph nodes negative ?01/24/2020 to 02/21/2020: Adjuvant radiation ?11/19/2019: Genetics: Negative ?September 2021: Tamoxifen (status post hysterectomy and BSO) ? ?Tamoxifen adverse effects: ?1.  Leukopenia: Unclear etiology: Previous work-up was negative for ANA.  B12 level was 230 in 2022.  I recommended that she take a sublingual B12 supplement. ? ?Breast cancer surveillance: ?1.  Breast exam 12/04/2021: Benign ?2. mammogram 11/09/2021: 1.1 cm palpable mass adjacent to the lumpectomy site: Biopsy: Fat necrosis ? ?Severe right chest wall pain: I will get an x-ray of the chest today.  Pain has been going on for 2 months. ?We will call her with the results of the x-ray and decide if further scans are necessary. ?Bone density will also be ordered.  In 2021 bone density was 0.3: Normal ? ?Return to clinic in 1 year for follow-up ? ? ? ?No orders of the defined types were placed in this encounter. ? ?The patient has a good understanding of the overall plan. she agrees with it. she will call with any problems that may develop before the next visit here. ?Total time spent: 30 mins including face to face time and time spent for planning, charting and co-ordination of care ? ? Viinay K Gudena, MD ?12/04/21 ? ? ? I Deritra, [place] am scribing for Dr. Gudena ? ?I have  reviewed the above documentation for accuracy and completeness, and I agree with the above. ?  ?

## 2021-11-30 ENCOUNTER — Other Ambulatory Visit: Payer: Self-pay

## 2021-11-30 ENCOUNTER — Other Ambulatory Visit: Payer: Self-pay | Admitting: *Deleted

## 2021-11-30 DIAGNOSIS — Z170 Estrogen receptor positive status [ER+]: Secondary | ICD-10-CM

## 2021-11-30 MED ORDER — TAMOXIFEN CITRATE 20 MG PO TABS: 20.0000 mg | ORAL_TABLET | Freq: Every day | ORAL | 2 refills | Status: DC

## 2021-12-04 ENCOUNTER — Inpatient Hospital Stay: Payer: BC Managed Care – PPO | Attending: Adult Health | Admitting: Hematology and Oncology

## 2021-12-04 ENCOUNTER — Other Ambulatory Visit: Payer: Self-pay

## 2021-12-04 ENCOUNTER — Ambulatory Visit (HOSPITAL_COMMUNITY)
Admission: RE | Admit: 2021-12-04 | Discharge: 2021-12-04 | Disposition: A | Discharge status: Home / Self Care | Payer: BC Managed Care – PPO | Source: Ambulatory Visit | Attending: Hematology and Oncology | Admitting: Hematology and Oncology

## 2021-12-04 ENCOUNTER — Inpatient Hospital Stay (HOSPITAL_BASED_OUTPATIENT_CLINIC_OR_DEPARTMENT_OTHER): Payer: BC Managed Care – PPO

## 2021-12-04 VITALS — BP 151/68 | HR 61 | Temp 97.9°F | Resp 18 | Ht 64.0 in | Wt 135.6 lb

## 2021-12-04 DIAGNOSIS — Z170 Estrogen receptor positive status [ER+]: Secondary | ICD-10-CM | POA: Insufficient documentation

## 2021-12-04 DIAGNOSIS — Z90710 Acquired absence of both cervix and uterus: Secondary | ICD-10-CM | POA: Diagnosis not present

## 2021-12-04 DIAGNOSIS — R0789 Other chest pain: Secondary | ICD-10-CM | POA: Insufficient documentation

## 2021-12-04 DIAGNOSIS — C50411 Malignant neoplasm of upper-outer quadrant of right female breast: Secondary | ICD-10-CM | POA: Insufficient documentation

## 2021-12-04 DIAGNOSIS — Z9079 Acquired absence of other genital organ(s): Secondary | ICD-10-CM | POA: Insufficient documentation

## 2021-12-04 DIAGNOSIS — D72819 Decreased white blood cell count, unspecified: Secondary | ICD-10-CM | POA: Insufficient documentation

## 2021-12-04 DIAGNOSIS — Z780 Asymptomatic menopausal state: Secondary | ICD-10-CM

## 2021-12-04 DIAGNOSIS — Z79810 Long term (current) use of selective estrogen receptor modulators (SERMs): Secondary | ICD-10-CM | POA: Diagnosis not present

## 2021-12-04 DIAGNOSIS — R0781 Pleurodynia: Secondary | ICD-10-CM | POA: Insufficient documentation

## 2021-12-04 DIAGNOSIS — Z90722 Acquired absence of ovaries, bilateral: Secondary | ICD-10-CM | POA: Insufficient documentation

## 2021-12-04 DIAGNOSIS — Z923 Personal history of irradiation: Secondary | ICD-10-CM | POA: Insufficient documentation

## 2021-12-04 LAB — CBC WITH DIFFERENTIAL (CANCER CENTER ONLY)
Abs Immature Granulocytes: 0 10*3/uL (ref 0.00–0.07)
Basophils Absolute: 0 10*3/uL (ref 0.0–0.1)
Basophils Relative: 1 %
Eosinophils Absolute: 0.1 10*3/uL (ref 0.0–0.5)
Eosinophils Relative: 2 %
HCT: 35.3 % — ABNORMAL LOW (ref 36.0–46.0)
Hemoglobin: 11.6 g/dL — ABNORMAL LOW (ref 12.0–15.0)
Immature Granulocytes: 0 %
Lymphocytes Relative: 42 %
Lymphs Abs: 1.3 10*3/uL (ref 0.7–4.0)
MCH: 28.9 pg (ref 26.0–34.0)
MCHC: 32.9 g/dL (ref 30.0–36.0)
MCV: 88 fL (ref 80.0–100.0)
Monocytes Absolute: 0.2 10*3/uL (ref 0.1–1.0)
Monocytes Relative: 8 %
Neutro Abs: 1.4 10*3/uL — ABNORMAL LOW (ref 1.7–7.7)
Neutrophils Relative %: 47 %
Platelet Count: 159 10*3/uL (ref 150–400)
RBC: 4.01 MIL/uL (ref 3.87–5.11)
RDW: 12.5 % (ref 11.5–15.5)
WBC Count: 3.1 10*3/uL — ABNORMAL LOW (ref 4.0–10.5)
nRBC: 0 % (ref 0.0–0.2)

## 2021-12-04 LAB — CMP (CANCER CENTER ONLY)
ALT: 22 U/L (ref 0–44)
AST: 29 U/L (ref 15–41)
Albumin: 3.9 g/dL (ref 3.5–5.0)
Alkaline Phosphatase: 48 U/L (ref 38–126)
Anion gap: 7 (ref 5–15)
BUN: 16 mg/dL (ref 6–20)
CO2: 28 mmol/L (ref 22–32)
Calcium: 9 mg/dL (ref 8.9–10.3)
Chloride: 108 mmol/L (ref 98–111)
Creatinine: 0.91 mg/dL (ref 0.44–1.00)
GFR, Estimated: >60 mL/min (ref >60)
Glucose, Bld: 93 mg/dL (ref 70–99)
Potassium: 3.9 mmol/L (ref 3.5–5.1)
Sodium: 143 mmol/L (ref 135–145)
Total Bilirubin: 0.8 mg/dL (ref 0.3–1.2)
Total Protein: 6.8 g/dL (ref 6.5–8.1)

## 2021-12-04 NOTE — Assessment & Plan Note (Signed)
11/01/2019: Right breast UOQ grade 1 ILC ER/PR positive HER2 negative Ki-67 2%, breast MRI 2 additional right breast lesions and one left breast lesion: Benign ?12/23/2019: Right lumpectomy: Stage Ia ILC grade 2, margins negative, 0/2 lymph nodes negative ?01/24/2020 to 02/21/2020: Adjuvant radiation ?11/19/2019: Genetics: Negative ?September 2021: Tamoxifen (status post hysterectomy and BSO) ? ?Tamoxifen adverse effects: ?1.  Leukopenia: Unclear etiology ? ?Breast cancer surveillance: ?1.  Breast exam 12/04/2021: Benign ?2. mammogram 11/09/2021: 1.1 cm palpable mass adjacent to the lumpectomy site: Biopsy: Fat necrosis ? ?Return to clinic in 1 year for follow-up ? ?

## 2021-12-06 ENCOUNTER — Telehealth: Payer: Self-pay | Admitting: Hematology and Oncology

## 2021-12-06 NOTE — Telephone Encounter (Signed)
Scheduled appointment per 5/10 los. Patient is aware. ?

## 2021-12-11 ENCOUNTER — Other Ambulatory Visit: Payer: Self-pay | Admitting: Hematology and Oncology

## 2021-12-11 DIAGNOSIS — E2839 Other primary ovarian failure: Secondary | ICD-10-CM

## 2021-12-13 ENCOUNTER — Other Ambulatory Visit: Payer: Self-pay | Admitting: Internal Medicine

## 2022-05-24 ENCOUNTER — Other Ambulatory Visit: Payer: Self-pay | Admitting: Hematology and Oncology

## 2022-05-24 DIAGNOSIS — Z1231 Encounter for screening mammogram for malignant neoplasm of breast: Secondary | ICD-10-CM

## 2022-05-29 ENCOUNTER — Ambulatory Visit
Admission: RE | Admit: 2022-05-29 | Discharge: 2022-05-29 | Disposition: A | Discharge status: Home / Self Care | Payer: BC Managed Care – PPO | Source: Ambulatory Visit | Attending: Hematology and Oncology | Admitting: Hematology and Oncology

## 2022-05-29 DIAGNOSIS — E2839 Other primary ovarian failure: Secondary | ICD-10-CM

## 2022-06-09 ENCOUNTER — Other Ambulatory Visit: Payer: Self-pay | Admitting: Internal Medicine

## 2022-06-10 ENCOUNTER — Other Ambulatory Visit: Payer: Self-pay | Admitting: Hematology and Oncology

## 2022-06-10 ENCOUNTER — Other Ambulatory Visit: Payer: Self-pay | Admitting: *Deleted

## 2022-06-19 NOTE — Progress Notes (Deleted)
56 y.o. G49P1001 Married Caucasian female here for annual exam.    PCP:     Patient's last menstrual period was 10/15/2014 (exact date).           Sexually active: {yes no:314532}  The current method of family planning is status post hysterectomy.    Exercising: {yes no:314532}  {types:19826} Smoker:  {YES NO:22349}  Health Maintenance: Pap:  12-20-14 neg HPV HR neg  History of abnormal Pap:  yes,hx of cryotherapy to cervix yrs ago. Final surgical pathology of cervix was benign  MMG:  11/07/20 Breast Density Category C, BI-RADS CATEGORY 2 benign Colonoscopy:  2017 f/u 10 years BMD:   05/29/22  Result  normal TDaP:  01/13/2017 Gardasil:   no HIV: negative 2017 Hep C: neg 2020 Screening Labs:  Hb today: ***, Urine today: ***   reports that she has never smoked. She has never used smokeless tobacco. She reports that she does not drink alcohol and does not use drugs.  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   Complication of anesthesia    Per patient after hysterectomy in 2016, had to stay over an extra night due to low oxygen levels   Dyspareunia    Elevated serum creatinine    Family history of colon cancer    Family history of lung cancer    Family history of melanoma    Family history of pancreatic cancer    Family history of stomach cancer    Family history of uterine cancer    Fibroid    GERD (gastroesophageal reflux disease)    takes protonix as needed    Hypothyroidism    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 Rosana Hoes   BREAST EXCISIONAL BIOPSY Left    benign   BREAST LUMPECTOMY WITH RADIOACTIVE SEED AND SENTINEL LYMPH NODE BIOPSY Right 12/23/2019   Procedure: RIGHT BREAST LUMPECTOMY X 3 WITH RADIOACTIVE SEED AND SENTINEL LYMPH NODE MAPPING;  Surgeon: Erroll Luna, MD;  Location: [facility];  Service: General;  Laterality: Right;   BREAST SURGERY Right 04/2010   breast biopsy for multiple fibroadenoma - no atypia   CATARACT EXTRACTION, BILATERAL     CESAREAN SECTION     CRYOTHERAPY  5/91   for persistent condyloma atypia   CYSTOSCOPY N/A 02/14/2015   Procedure: CYSTOSCOPY;  Surgeon: Nunzio Cobbs, MD;  Location: [facility];  Service: Gynecology;  Laterality: N/A;   LASER ABLATION OF CONDYLOMAS  03/05/90 Dr. 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   MELANOMA EXCISION Right 03/2013   melanoma right arm   ROBOTIC ASSISTED TOTAL HYSTERECTOMY N/A 02/14/2015   Procedure: ROBOTIC ASSISTED TOTAL HYSTERECTOMY;  Surgeon: Nunzio Cobbs, MD;  Location: [facility];  Service: Gynecology;  Laterality: N/A;   SKIN CANCER EXCISION  06/2013   midddle of back with path  report of atypia   TUBAL LIGATION Bilateral    WISDOM TOOTH EXTRACTION      Current Outpatient Medications  Medication Sig Dispense Refill   gabapentin (NEURONTIN) 300 MG capsule TAKE 1 CAPSULE BY MOUTH EVERYDAY AT BEDTIME 90 capsule 4   latanoprost (XALATAN) 0.005 % ophthalmic solution Place 1 drop into both eyes at bedtime.     levothyroxine (SYNTHROID) 50 MCG tablet TAKE 1 TABLET BY MOUTH EVERY DAY 90 tablet 1   tamoxifen (NOLVADEX) 20 MG tablet TAKE 1 TABLET BY MOUTH EVERY DAY 90 tablet 2   tretinoin (RETIN-A) 0.025 % cream APPLY A PEA SIZE AMOUNT TO AFFECTED AREAS NIGHTLY.     No current facility-administered medications for this visit.    Family History  Problem Relation Age of Onset   Cancer Mother        lung and liver   Alcohol abuse  Father        Cirrhosis of liver   Cancer Maternal Aunt        stomach cancer   Cancer Maternal Grandmother        uterine cancer   Heart failure Paternal Grandmother    Lung cancer Maternal Uncle    Lung cancer Maternal Uncle    Pancreatic cancer Paternal Uncle    Testicular cancer Cousin    Melanoma Cousin        of eye   Cancer Cousin        possible colon? dx 53s   Colon cancer Other    Breast cancer Cousin     Review of Systems  Exam:   LMP 10/15/2014 (Exact Date)     General appearance: alert, cooperative and appears stated age Head: normocephalic, without obvious abnormality, atraumatic Neck: no adenopathy, supple, symmetrical, trachea midline and thyroid normal to inspection and palpation Lungs: clear to auscultation bilaterally Breasts: normal appearance, no masses or tenderness, No nipple retraction or dimpling, No nipple discharge or bleeding, No axillary adenopathy Heart: regular rate and rhythm Abdomen: soft, non-tender; no masses, no organomegaly Extremities: extremities normal, atraumatic, no cyanosis or edema Skin: skin color, texture, turgor normal. No rashes or lesions Lymph nodes: cervical, supraclavicular, and axillary nodes normal. Neurologic: grossly normal  Pelvic: External genitalia:  no lesions              No abnormal inguinal nodes palpated.              Urethra:  normal appearing urethra with no masses, tenderness or lesions              Bartholins and Skenes: normal                 Vagina: normal appearing vagina with normal color and discharge, no lesions              Cervix: no lesions              Pap taken: {yes no:314532} Bimanual Exam:  Uterus:  normal size, contour, position, consistency, mobility, non-tender              Adnexa: no mass, fullness, tenderness              Rectal exam: {yes no:314532}.  Confirms.              Anus:  normal sphincter tone, no lesions  Chaperone was present for exam:  ***  Assessment:   Well woman  visit with gynecologic exam.   Plan: Mammogram screening discussed. Self  breast awareness reviewed. Pap and HR HPV as above. Guidelines for Calcium, Vitamin D, regular exercise program including cardiovascular and weight bearing exercise.   Follow up annually and prn.   Additional counseling given.  {yes [id number]. _______ minutes face to face time of which over 50% was spent in counseling.    After visit summary provided.

## 2022-06-26 ENCOUNTER — Ambulatory Visit: Payer: BC Managed Care – PPO | Admitting: Obstetrics and Gynecology

## 2022-07-02 ENCOUNTER — Other Ambulatory Visit: Payer: Self-pay | Admitting: Hematology and Oncology

## 2022-07-08 ENCOUNTER — Ambulatory Visit: Payer: BC Managed Care – PPO | Admitting: Obstetrics and Gynecology

## 2022-10-30 ENCOUNTER — Ambulatory Visit
Admission: RE | Admit: 2022-10-30 | Discharge: 2022-10-30 | Disposition: A | Discharge status: Home / Self Care | Payer: BC Managed Care – PPO | Source: Ambulatory Visit | Attending: Hematology and Oncology | Admitting: Hematology and Oncology

## 2022-10-30 DIAGNOSIS — Z1231 Encounter for screening mammogram for malignant neoplasm of breast: Secondary | ICD-10-CM

## 2022-10-30 HISTORY — DX: Personal history of irradiation: Z92.3

## 2022-11-24 ENCOUNTER — Other Ambulatory Visit: Payer: Self-pay | Admitting: Internal Medicine

## 2022-12-05 NOTE — Progress Notes (Signed)
Patient Care Team: Pincus Sanes, MD as PCP - General (Internal Medicine) Antony Blackbird, MD as Consulting Physician (Radiation Oncology) Harriette Bouillon, MD as Consulting Physician (General Surgery) Ardell Isaacs, Forrestine Him, MD as Consulting Physician (Obstetrics and Gynecology) Elinor Parkinson, [place] as Consulting Physician (Podiatry) Aris Lot, MD as Consulting Physician (Dermatology) Charna Elizabeth, MD as Consulting Physician (Gastroenterology)  DIAGNOSIS:  Encounter Diagnosis  Name Primary?   Malignant neoplasm of upper-outer quadrant of right breast in female, estrogen receptor positive ([facility]) Yes    SUMMARY OF ONCOLOGIC HISTORY: Oncology History  Malignant neoplasm of upper-outer quadrant of right breast in female, estrogen receptor positive ([facility])  11/04/2019 Initial Diagnosis   Malignant neoplasm of upper-outer quadrant of right breast in female, estrogen receptor positive ([facility])    Genetic Testing   Negative genetic testing. No pathogenic variants identified on the [facility] Common Hereditary Cancers + Melanoma panel. VUS in APC, POLE, and RNF43 identified. The report date is 11/19/2019.  The Common Hereditary Cancers Panel + Melanoma panel offered by [facility] includes sequencing and/or deletion duplication testing of the following 53 genes: APC*, ATM*, AXIN2, BAP1, BARD1, BMPR1A, BRCA1, BRCA2, BRIP1, CDH1, CDK4, CDKN2A (p14ARF), CDKN2A (p16INK4a), CHEK2, CTNNA1, DICER1*, EGFR, EPCAM*, GREM1*, HOXB13, KIT, MEN1*, MITF*, MLH1*, MSH2*, MSH3*, MSH6*, MUTYH, NBN, NF1*, NTHL1, PALB2, PDGFRA, PMS2*, POLD1*, POLE, POT1, PTEN*, RAD50, RAD51C, RAD51D, RB1*, RNF43, SDHA*, SDHB, SDHC*, SDHD, SMAD4, SMARCA4, STK11, TP53, TSC1*, TSC2, VHL.   12/23/2019 Cancer Staging   Staging form: Breast, AJCC 8th Edition - Pathologic stage from 12/23/2019: Stage IA (pT1a, pN0, cM0, G2, ER+, PR+, HER2-) - Signed by Loa Socks, NP on 01/12/2020     CHIEF COMPLIANT: Follow-up on  tamoxifen  INTERVAL HISTORY: Alexis Young is a 57 y.o. with the above mentioned Estrogen receptor positive breast cancer on tamoxifen. She presents to the clinic today for a follow-up. She reports that her health is pretty good. She does have some moderate hot flashes. She says it doesn't affect her daily living. She still have quite a bit of fatigue. She does walk and workout.   ALLERGIES:  is allergic to tetracyclines & related, asa [aspirin], and sulfa antibiotics.  MEDICATIONS:  Current Outpatient Medications  Medication Sig Dispense Refill   gabapentin (NEURONTIN) 300 MG capsule TAKE 1 CAPSULE BY MOUTH EVERYDAY AT BEDTIME 90 capsule 4   latanoprost (XALATAN) 0.005 % ophthalmic solution Place 1 drop into both eyes at bedtime.     levothyroxine (SYNTHROID) 50 MCG tablet TAKE 1 TABLET BY MOUTH EVERY DAY 90 tablet 1   tamoxifen (NOLVADEX) 20 MG tablet Take 1 tablet (20 mg total) by mouth daily. 90 tablet 3   tretinoin (RETIN-A) 0.025 % cream APPLY A PEA SIZE AMOUNT TO AFFECTED AREAS NIGHTLY.     No current facility-administered medications for this visit.    PHYSICAL EXAMINATION: ECOG PERFORMANCE STATUS: 1 - Symptomatic but completely ambulatory  Vitals:   12/09/22 0840  BP: (!) 160/66  Pulse: (!) 57  Resp: 18  Temp: 97.8 F (36.6 C)  SpO2: 100%   Filed Weights   12/09/22 0840  Weight: 134 lb 11.2 oz (61.1 kg)    BREAST: No palpable masses or nodules in either right or left breasts. No palpable axillary supraclavicular or infraclavicular adenopathy no breast tenderness or nipple discharge. (exam performed in the presence of a chaperone)  LABORATORY DATA:  I have reviewed the data as listed    Latest Ref Rng & Units 12/04/2021    8:25  AM 07/05/2021    9:44 AM 05/01/2021    8:32 AM  CMP  Glucose 70 - 99 mg/dL 93  87  92   BUN 6 - 20 mg/dL 16  11  14    Creatinine 0.44 - 1.00 mg/dL 4.09  8.11  9.14   Sodium 135 - 145 mmol/L 143  141  141   Potassium 3.5 - 5.1 mmol/L  3.9  4.2  3.8   Chloride 98 - 111 mmol/L 108  108  108   CO2 22 - 32 mmol/L 28  28  27    Calcium 8.9 - 10.3 mg/dL 9.0  9.2  9.1   Total Protein 6.5 - 8.1 g/dL 6.8  6.9  7.0   Total Bilirubin 0.3 - 1.2 mg/dL 0.8  0.6  0.8   Alkaline Phos 38 - 126 U/L 48  47  51   AST 15 - 41 U/L 29  18  18    ALT 0 - 44 U/L 22  15  14      Lab Results  Component Value Date   WBC 3.1 (L) 12/04/2021   HGB 11.6 (L) 12/04/2021   HCT 35.3 (L) 12/04/2021   MCV 88.0 12/04/2021   PLT 159 12/04/2021   NEUTROABS 1.4 (L) 12/04/2021    ASSESSMENT & PLAN:  Malignant neoplasm of upper-outer quadrant of right breast in female, estrogen receptor positive ([facility]) 11/01/2019: Right breast UOQ grade 1 ILC ER/PR positive HER2 negative Ki-67 2%, breast MRI 2 additional right breast lesions and one left breast lesion: Benign 12/23/2019: Right lumpectomy: Stage Ia ILC grade 2, margins negative, 0/2 lymph nodes negative 01/24/2020 to 02/21/2020: Adjuvant radiation 11/19/2019: Genetics: Negative September 2021: Tamoxifen (status post hysterectomy and BSO)   Tamoxifen adverse effects: 1.  Hot flashes: Currently is taking gabapentin   Patient is able to walk 3 to 5 miles on a daily basis rain or shine.  Breast cancer surveillance: 1.  Breast exam 12/09/2022: Benign 2. mammogram 11/04/2022: Benign breast density category C   Bone density 05/29/2022: T-score -0.1 (in 2021 bone density was 0.3: Normal)   Return to clinic in 1 year for follow-up    No orders of the defined types were placed in this encounter.  The patient has a good understanding of the overall plan. she agrees with it. she will call with any problems that may develop before the next visit here. Total time spent: 30 mins including face to face time and time spent for planning, charting and co-ordination of care   Tamsen Meek, MD 12/09/22    I Janan Ridge am acting as a Neurosurgeon for [employer]  I have reviewed the above documentation for  accuracy and completeness, and I agree with the above.

## 2022-12-09 ENCOUNTER — Other Ambulatory Visit: Payer: Self-pay

## 2022-12-09 ENCOUNTER — Inpatient Hospital Stay: Payer: BC Managed Care – PPO | Attending: Hematology and Oncology | Admitting: Hematology and Oncology

## 2022-12-09 VITALS — BP 160/66 | HR 57 | Temp 97.8°F | Resp 18 | Ht 64.0 in | Wt 134.7 lb

## 2022-12-09 DIAGNOSIS — C50411 Malignant neoplasm of upper-outer quadrant of right female breast: Secondary | ICD-10-CM

## 2022-12-09 DIAGNOSIS — Z90710 Acquired absence of both cervix and uterus: Secondary | ICD-10-CM | POA: Diagnosis not present

## 2022-12-09 DIAGNOSIS — Z170 Estrogen receptor positive status [ER+]: Secondary | ICD-10-CM | POA: Diagnosis not present

## 2022-12-09 DIAGNOSIS — C50311 Malignant neoplasm of lower-inner quadrant of right female breast: Secondary | ICD-10-CM | POA: Insufficient documentation

## 2022-12-09 DIAGNOSIS — Z79810 Long term (current) use of selective estrogen receptor modulators (SERMs): Secondary | ICD-10-CM | POA: Insufficient documentation

## 2022-12-09 MED ORDER — TAMOXIFEN CITRATE 20 MG PO TABS: 20.0000 mg | ORAL_TABLET | Freq: Every day | ORAL | 3 refills | Status: DC

## 2022-12-09 NOTE — Assessment & Plan Note (Addendum)
11/01/2019: Right breast UOQ grade 1 ILC ER/PR positive HER2 negative Ki-67 2%, breast MRI 2 additional right breast lesions and one left breast lesion: Benign 12/23/2019: Right lumpectomy: Stage Ia ILC grade 2, margins negative, 0/2 lymph nodes negative 01/24/2020 to 02/21/2020: Adjuvant radiation 11/19/2019: Genetics: Negative September 2021: Tamoxifen (status post hysterectomy and BSO)   Tamoxifen adverse effects: 1.  Hot flashes: Currently is taking gabapentin   Patient is able to walk 3 to 5 miles on a daily basis rain or shine.  Breast cancer surveillance: 1.  Breast exam 12/09/2022: Benign 2. mammogram 11/04/2022: Benign breast density category C   Bone density 05/29/2022: T-score -0.1 (in 2021 bone density was 0.3: Normal)   Return to clinic in 1 year for follow-up

## 2022-12-25 ENCOUNTER — Encounter: Payer: Self-pay | Admitting: Internal Medicine

## 2022-12-25 NOTE — Patient Instructions (Addendum)

## 2022-12-25 NOTE — Progress Notes (Unsigned)
Subjective:    Patient ID: Alexis Young, female    DOB: [birth year], 57 y.o.   MRN: [id number]      HPI [place] is here for a Physical exam and her chronic medical problems.    Fatigued.  Not sleeping great  Stress Re son - just graduated college - looking for job   Medications and allergies reviewed with patient and updated if appropriate.  Current Outpatient Medications on File Prior to Visit  Medication Sig Dispense Refill   Cyanocobalamin (B-12 PO) Take by mouth.     gabapentin (NEURONTIN) 300 MG capsule TAKE 1 CAPSULE BY MOUTH EVERYDAY AT BEDTIME 90 capsule 4   latanoprost (XALATAN) 0.005 % ophthalmic solution Place 1 drop into both eyes at bedtime.     levothyroxine (SYNTHROID) 50 MCG tablet TAKE 1 TABLET BY MOUTH EVERY DAY 90 tablet 1   tamoxifen (NOLVADEX) 20 MG tablet Take 1 tablet (20 mg total) by mouth daily. 90 tablet 3   tretinoin (RETIN-A) 0.025 % cream APPLY A PEA SIZE AMOUNT TO AFFECTED AREAS NIGHTLY.     No current facility-administered medications on file prior to visit.    Review of Systems  Constitutional:  Positive for fatigue. Negative for fever.  Eyes:  Negative for visual disturbance.  Respiratory:  Negative for cough, shortness of breath and wheezing.   Cardiovascular:  Negative for chest pain, palpitations and leg swelling.  Gastrointestinal:  Negative for abdominal pain, blood in stool, constipation and diarrhea.       No gerd  Genitourinary:  Positive for frequency. Negative for dysuria.  Musculoskeletal:  Negative for arthralgias and back pain.  Skin:  Negative for rash.  Neurological:  Positive for light-headedness (occ). Negative for headaches.  Psychiatric/Behavioral:  Positive for sleep disturbance. Negative for dysphoric mood. The patient is not nervous/anxious.        Objective:   Vitals:   12/26/22 0825  BP: 128/78  Pulse: (!) 58  Temp: 98.1 F (36.7 C)  SpO2: 99%   Filed Weights   12/26/22 0825  Weight: 133 lb (60.3  kg)   Body mass index is 22.83 kg/m.  BP Readings from Last 3 Encounters:  12/26/22 128/78  12/09/22 (!) 160/66  12/04/21 (!) 151/68    Wt Readings from Last 3 Encounters:  12/26/22 133 lb (60.3 kg)  12/09/22 134 lb 11.2 oz (61.1 kg)  12/04/21 135 lb 9.6 oz (61.5 kg)       Physical Exam Constitutional: She appears well-developed and well-nourished. No distress.  HENT:  Head: Normocephalic and atraumatic.  Right Ear: External ear normal. Normal ear canal and TM Left Ear: External ear normal.  Normal ear canal and TM Mouth/Throat: Oropharynx is clear and moist.  Eyes: Conjunctivae normal.  Neck: Neck supple. No tracheal deviation present. No thyromegaly present.  No carotid bruit  Cardiovascular: Normal rate, regular rhythm and normal heart sounds.   No murmur heard.  No edema. Pulmonary/Chest: Effort normal and breath sounds normal. No respiratory distress. She has no wheezes. She has no rales.  Breast: deferred   Abdominal: Soft. She exhibits no distension. There is no tenderness.  Lymphadenopathy: She has no cervical adenopathy.  Skin: Skin is warm and dry. She is not diaphoretic.  Psychiatric: She has a normal mood and affect. Her behavior is normal.     Lab Results  Component Value Date   WBC 3.1 (L) 12/04/2021   HGB 11.6 (L) 12/04/2021   HCT 35.3 (L) 12/04/2021   PLT  159 12/04/2021   GLUCOSE 93 12/04/2021   CHOL 197 07/05/2021   TRIG 88.0 07/05/2021   HDL 67.20 07/05/2021   LDLDIRECT 167.0 06/29/2019   LDLCALC 112 (H) 07/05/2021   ALT 22 12/04/2021   AST 29 12/04/2021   NA 143 12/04/2021   K 3.9 12/04/2021   CL 108 12/04/2021   CREATININE 0.91 12/04/2021   BUN 16 12/04/2021   CO2 28 12/04/2021   TSH 3.81 07/05/2021   HGBA1C 5.5 07/05/2021         Assessment & Plan:   Physical exam: Screening blood work  ordered Exercise  walking  Weight   normal Substance abuse  none   Reviewed recommended immunizations.   Health Maintenance  Topic  Date Due   COVID-19 Vaccine (1) 01/11/2023 (Originally 12/30/1970)   Zoster Vaccines- Shingrix (1 of 2) 03/28/2023 (Originally 12/31/1983)   INFLUENZA VACCINE  02/27/2023   MAMMOGRAM  10/29/2024   Colonoscopy  03/22/2026   DTaP/Tdap/Td (3 - Td or Tdap) 01/14/2027   DEXA SCAN  05/30/2027   Hepatitis C Screening  Completed   HIV Screening  Completed   HPV VACCINES  Aged Out        See Problem List for Assessment and Plan of chronic medical problems.

## 2022-12-26 ENCOUNTER — Ambulatory Visit (INDEPENDENT_AMBULATORY_CARE_PROVIDER_SITE_OTHER): Payer: BC Managed Care – PPO | Admitting: Internal Medicine

## 2022-12-26 VITALS — BP 128/78 | HR 58 | Temp 98.1°F | Ht 64.0 in | Wt 133.0 lb

## 2022-12-26 DIAGNOSIS — E782 Mixed hyperlipidemia: Secondary | ICD-10-CM | POA: Diagnosis not present

## 2022-12-26 DIAGNOSIS — R7303 Prediabetes: Secondary | ICD-10-CM

## 2022-12-26 DIAGNOSIS — Z0000 Encounter for general adult medical examination without abnormal findings: Secondary | ICD-10-CM

## 2022-12-26 DIAGNOSIS — E039 Hypothyroidism, unspecified: Secondary | ICD-10-CM | POA: Diagnosis not present

## 2022-12-26 DIAGNOSIS — R350 Frequency of micturition: Secondary | ICD-10-CM

## 2022-12-26 LAB — COMPREHENSIVE METABOLIC PANEL
ALT: 14 U/L (ref 0–35)
AST: 19 U/L (ref 0–37)
Albumin: 4.2 g/dL (ref 3.5–5.2)
Alkaline Phosphatase: 44 U/L (ref 39–117)
BUN: 18 mg/dL (ref 6–23)
CO2: 28 mEq/L (ref 19–32)
Calcium: 9 mg/dL (ref 8.4–10.5)
Chloride: 105 mEq/L (ref 96–112)
Creatinine, Ser: 0.98 mg/dL (ref 0.40–1.20)
GFR: 63.86 mL/min (ref >60.00)
Glucose, Bld: 93 mg/dL (ref 70–99)
Potassium: 4.2 mEq/L (ref 3.5–5.1)
Sodium: 140 mEq/L (ref 135–145)
Total Bilirubin: 0.9 mg/dL (ref 0.2–1.2)
Total Protein: 6.9 g/dL (ref 6.0–8.3)

## 2022-12-26 LAB — TSH: TSH: 2.48 u[IU]/mL (ref 0.35–5.50)

## 2022-12-26 LAB — LIPID PANEL
Cholesterol: 175 mg/dL (ref 0–200)
HDL: 67.6 mg/dL (ref >39.00)
LDL Cholesterol: 91 mg/dL (ref 0–99)
NonHDL: 107.04
Total CHOL/HDL Ratio: 3
Triglycerides: 78 mg/dL (ref 0.0–149.0)
VLDL: 15.6 mg/dL (ref 0.0–40.0)

## 2022-12-26 LAB — CBC WITH DIFFERENTIAL/PLATELET
Basophils Absolute: 0 10*3/uL (ref 0.0–0.1)
Basophils Relative: 0.8 % (ref 0.0–3.0)
Eosinophils Absolute: 0.1 10*3/uL (ref 0.0–0.7)
Eosinophils Relative: 2 % (ref 0.0–5.0)
HCT: 37.3 % (ref 36.0–46.0)
Hemoglobin: 12.3 g/dL (ref 12.0–15.0)
Lymphocytes Relative: 40.4 % (ref 12.0–46.0)
Lymphs Abs: 1.2 10*3/uL (ref 0.7–4.0)
MCHC: 32.9 g/dL (ref 30.0–36.0)
MCV: 87.3 fl (ref 78.0–100.0)
Monocytes Absolute: 0.3 10*3/uL (ref 0.1–1.0)
Monocytes Relative: 9.3 % (ref 3.0–12.0)
Neutro Abs: 1.4 10*3/uL (ref 1.4–7.7)
Neutrophils Relative %: 47.5 % (ref 43.0–77.0)
Platelets: 148 10*3/uL — ABNORMAL LOW (ref 150.0–400.0)
RBC: 4.27 Mil/uL (ref 3.87–5.11)
RDW: 13.4 % (ref 11.5–15.5)
WBC: 3 10*3/uL — ABNORMAL LOW (ref 4.0–10.5)

## 2022-12-26 LAB — HEMOGLOBIN A1C: Hgb A1c MFr Bld: 5.3 % (ref 4.6–6.5)

## 2022-12-26 NOTE — Assessment & Plan Note (Signed)
Chronic  Clinically euthyroid Currently taking levothyroxine 50 mcg daily Check tsh  Titrate med dose if needed  

## 2022-12-26 NOTE — Assessment & Plan Note (Signed)
Chronic Check a1c Low sugar / carb diet Continue regular exercise   

## 2022-12-26 NOTE — Assessment & Plan Note (Signed)
Chronic Regular exercise and healthy diet  Check lipid panel, CMP   Currently diet controlled

## 2022-12-27 LAB — URINE CULTURE: Result:: NO GROWTH

## 2023-03-05 ENCOUNTER — Other Ambulatory Visit: Payer: Self-pay | Admitting: Internal Medicine

## 2023-07-02 NOTE — Progress Notes (Unsigned)
    Subjective:    Patient ID: Alexis Young, female    DOB: 1966/01/06, 57 y.o.   MRN: [id number]      HPI Alexis Young is here for No chief complaint on file.        Medications and allergies reviewed with patient and updated if appropriate.  Current Outpatient Medications on File Prior to Visit  Medication Sig Dispense Refill   Cyanocobalamin (B-12 PO) Take by mouth.     gabapentin (NEURONTIN) 300 MG capsule TAKE 1 CAPSULE BY MOUTH EVERYDAY AT BEDTIME 90 capsule 4   latanoprost (XALATAN) 0.005 % ophthalmic solution Place 1 drop into both eyes at bedtime.     levothyroxine (SYNTHROID) 50 MCG tablet TAKE 1 TABLET BY MOUTH EVERY DAY 90 tablet 1   tamoxifen (NOLVADEX) 20 MG tablet Take 1 tablet (20 mg total) by mouth daily. 90 tablet 3   tretinoin (RETIN-A) 0.025 % cream APPLY A PEA SIZE AMOUNT TO AFFECTED AREAS NIGHTLY.     No current facility-administered medications on file prior to visit.    Review of Systems     Objective:  There were no vitals filed for this visit. BP Readings from Last 3 Encounters:  12/26/22 128/78  12/09/22 (!) 160/66  12/04/21 (!) 151/68   Wt Readings from Last 3 Encounters:  12/26/22 133 lb (60.3 kg)  12/09/22 134 lb 11.2 oz (61.1 kg)  12/04/21 135 lb 9.6 oz (61.5 kg)   There is no height or weight on file to calculate BMI.    Physical Exam         Assessment & Plan:    See Problem List for Assessment and Plan of chronic medical problems.

## 2023-07-03 ENCOUNTER — Encounter: Payer: Self-pay | Admitting: Internal Medicine

## 2023-07-03 ENCOUNTER — Ambulatory Visit: Payer: BC Managed Care – PPO | Admitting: Internal Medicine

## 2023-07-03 VITALS — BP 126/78 | HR 72 | Temp 98.1°F | Ht 64.0 in | Wt 128.0 lb

## 2023-07-03 DIAGNOSIS — E039 Hypothyroidism, unspecified: Secondary | ICD-10-CM | POA: Diagnosis not present

## 2023-07-03 DIAGNOSIS — R233 Spontaneous ecchymoses: Secondary | ICD-10-CM | POA: Diagnosis not present

## 2023-07-03 DIAGNOSIS — E782 Mixed hyperlipidemia: Secondary | ICD-10-CM | POA: Diagnosis not present

## 2023-07-03 DIAGNOSIS — B029 Zoster without complications: Secondary | ICD-10-CM | POA: Diagnosis not present

## 2023-07-03 LAB — CBC WITH DIFFERENTIAL/PLATELET
Basophils Absolute: 0 10*3/uL (ref 0.0–0.1)
Basophils Relative: 1 % (ref 0.0–3.0)
Eosinophils Absolute: 0 10*3/uL (ref 0.0–0.7)
Eosinophils Relative: 1.1 % (ref 0.0–5.0)
HCT: 39.2 % (ref 36.0–46.0)
Hemoglobin: 13.5 g/dL (ref 12.0–15.0)
Lymphocytes Relative: 30.7 % (ref 12.0–46.0)
Lymphs Abs: 1.1 10*3/uL (ref 0.7–4.0)
MCHC: 34.3 g/dL (ref 30.0–36.0)
MCV: 87.2 fL (ref 78.0–100.0)
Monocytes Absolute: 0.3 10*3/uL (ref 0.1–1.0)
Monocytes Relative: 7.2 % (ref 3.0–12.0)
Neutro Abs: 2.2 10*3/uL (ref 1.4–7.7)
Neutrophils Relative %: 60 % (ref 43.0–77.0)
Platelets: 127 10*3/uL — ABNORMAL LOW (ref 150.0–400.0)
RBC: 4.49 Mil/uL (ref 3.87–5.11)
RDW: 13.1 % (ref 11.5–15.5)
WBC: 3.7 10*3/uL — ABNORMAL LOW (ref 4.0–10.5)

## 2023-07-03 LAB — LIPID PANEL
Cholesterol: 185 mg/dL (ref 0–200)
HDL: 61 mg/dL (ref >39.00)
LDL Cholesterol: 108 mg/dL — ABNORMAL HIGH (ref 0–99)
NonHDL: 123.78
Total CHOL/HDL Ratio: 3
Triglycerides: 81 mg/dL (ref 0.0–149.0)
VLDL: 16.2 mg/dL (ref 0.0–40.0)

## 2023-07-03 LAB — COMPREHENSIVE METABOLIC PANEL
ALT: 11 U/L (ref 0–35)
AST: 18 U/L (ref 0–37)
Albumin: 4.3 g/dL (ref 3.5–5.2)
Alkaline Phosphatase: 38 U/L — ABNORMAL LOW (ref 39–117)
BUN: 19 mg/dL (ref 6–23)
CO2: 28 meq/L (ref 19–32)
Calcium: 9.2 mg/dL (ref 8.4–10.5)
Chloride: 105 meq/L (ref 96–112)
Creatinine, Ser: 0.91 mg/dL (ref 0.40–1.20)
GFR: 69.54 mL/min (ref >60.00)
Glucose, Bld: 86 mg/dL (ref 70–99)
Potassium: 4.2 meq/L (ref 3.5–5.1)
Sodium: 140 meq/L (ref 135–145)
Total Bilirubin: 0.8 mg/dL (ref 0.2–1.2)
Total Protein: 7.1 g/dL (ref 6.0–8.3)

## 2023-07-03 NOTE — Assessment & Plan Note (Signed)
Chronic Working on lifestyle She does exercise regularly and eats healthy Check lipid panel

## 2023-07-03 NOTE — Assessment & Plan Note (Signed)
Acute Diagnosed at urgent care last weekend Rash is consistent with shingles She is on Valtrex and will complete the 1 week course No significant pain, but does take gabapentin nightly-if needed we could increase this Discussed her other symptoms are consistent with shingles and will improve with time She will call with any questions or concerns

## 2023-07-03 NOTE — Assessment & Plan Note (Signed)
Chronic Fatigue unlikely related to thyroid given current shingles Continue levothyroxine 50 mcg daily

## 2023-07-03 NOTE — Assessment & Plan Note (Signed)
New She has 2 small bruises on left posterior forearm that are consistent with senile purpura States they tend to occur on bilateral forearms-nowhere else Discussed etiology-likely benign in nature She did have a minimally low platelet count 6 months ago Will check CBC, CMP today

## 2023-07-03 NOTE — Patient Instructions (Addendum)
      Blood work was ordered.       Medications changes include :   None      Return if symptoms worsen or fail to improve.

## 2023-08-25 ENCOUNTER — Other Ambulatory Visit: Payer: Self-pay | Admitting: Hematology and Oncology

## 2023-09-07 ENCOUNTER — Other Ambulatory Visit: Payer: Self-pay | Admitting: Internal Medicine

## 2023-09-22 ENCOUNTER — Other Ambulatory Visit: Payer: Self-pay | Admitting: Hematology and Oncology

## 2023-09-22 DIAGNOSIS — Z1231 Encounter for screening mammogram for malignant neoplasm of breast: Secondary | ICD-10-CM

## 2023-11-03 ENCOUNTER — Ambulatory Visit
Admission: RE | Admit: 2023-11-03 | Discharge: 2023-11-03 | Disposition: A | Discharge status: Home / Self Care | Payer: BC Managed Care – PPO | Source: Ambulatory Visit | Attending: Hematology and Oncology

## 2023-11-03 DIAGNOSIS — Z1231 Encounter for screening mammogram for malignant neoplasm of breast: Secondary | ICD-10-CM

## 2023-12-09 ENCOUNTER — Inpatient Hospital Stay: Attending: Hematology and Oncology | Admitting: Hematology and Oncology

## 2023-12-09 VITALS — BP 147/65 | HR 51 | Temp 97.7°F | Resp 18 | Ht 64.0 in | Wt 133.3 lb

## 2023-12-09 DIAGNOSIS — C50411 Malignant neoplasm of upper-outer quadrant of right female breast: Secondary | ICD-10-CM | POA: Insufficient documentation

## 2023-12-09 DIAGNOSIS — D72819 Decreased white blood cell count, unspecified: Secondary | ICD-10-CM | POA: Insufficient documentation

## 2023-12-09 DIAGNOSIS — Z79899 Other long term (current) drug therapy: Secondary | ICD-10-CM | POA: Diagnosis not present

## 2023-12-09 DIAGNOSIS — Z79810 Long term (current) use of selective estrogen receptor modulators (SERMs): Secondary | ICD-10-CM | POA: Insufficient documentation

## 2023-12-09 DIAGNOSIS — D696 Thrombocytopenia, unspecified: Secondary | ICD-10-CM | POA: Diagnosis not present

## 2023-12-09 DIAGNOSIS — Z170 Estrogen receptor positive status [ER+]: Secondary | ICD-10-CM | POA: Insufficient documentation

## 2023-12-09 MED ORDER — TAMOXIFEN CITRATE 20 MG PO TABS: 20.0000 mg | ORAL_TABLET | Freq: Every day | ORAL | 3 refills | Status: AC

## 2023-12-09 NOTE — Progress Notes (Signed)
 Patient Care Team: Colene Dauphin, MD as PCP - General (Internal Medicine) Retta Caster, MD as Consulting Physician (Radiation Oncology) Sim Dryer, MD as Consulting Physician (General Surgery) Jorie Newness, Blondie Burke, MD as Consulting Physician (Obstetrics and Gynecology) Clemetine Cypher, [place] as Consulting Physician (Podiatry) Gaynelle Keeling, MD as Consulting Physician (Dermatology) Tami Falcon, MD as Consulting Physician (Gastroenterology)  DIAGNOSIS:  Encounter Diagnosis  Name Primary?   Malignant neoplasm of upper-outer quadrant of right breast in female, estrogen receptor positive (HCC) Yes    SUMMARY OF ONCOLOGIC HISTORY: Oncology History  Malignant neoplasm of upper-outer quadrant of right breast in female, estrogen receptor positive (HCC)  11/04/2019 Initial Diagnosis   Malignant neoplasm of upper-outer quadrant of right breast in female, estrogen receptor positive (HCC)    Genetic Testing   Negative genetic testing. No pathogenic variants identified on the [facility] Common Hereditary Cancers + Melanoma panel. VUS in APC, POLE, and RNF43 identified. The report date is 11/19/2019.  The Common Hereditary Cancers Panel + Melanoma panel offered by [facility] includes sequencing and/or deletion duplication testing of the following 53 genes: APC*, ATM*, AXIN2, BAP1, BARD1, BMPR1A, BRCA1, BRCA2, BRIP1, CDH1, CDK4, CDKN2A (p14ARF), CDKN2A (p16INK4a), CHEK2, CTNNA1, DICER1*, EGFR, EPCAM*, GREM1*, HOXB13, KIT, MEN1*, MITF*, MLH1*, MSH2*, MSH3*, MSH6*, MUTYH, NBN, NF1*, NTHL1, PALB2, PDGFRA, PMS2*, POLD1*, POLE, POT1, PTEN*, RAD50, RAD51C, RAD51D, RB1*, RNF43, SDHA*, SDHB, SDHC*, SDHD, SMAD4, SMARCA4, STK11, TP53, TSC1*, TSC2, VHL.   12/23/2019 Cancer Staging   Staging form: Breast, AJCC 8th Edition - Pathologic stage from 12/23/2019: Stage IA (pT1a, pN0, cM0, G2, ER+, PR+, HER2-) - Signed by Percival Brace, NP on 01/12/2020     CHIEF COMPLIANT: Follow-up on tamoxifen   therapy  HISTORY OF PRESENT ILLNESS:   History of Present Illness Alexis Young is a 58 year old female with breast cancer on tamoxifen  who presents with concerns about nausea and breast density.  She experiences periodic nausea without a consistent pattern related to meals or specific foods. She has been on tamoxifen  for four years and experiences soreness in her chest, particularly under her arms. Her breast tissue is dense, classified as category C, and she is concerned about the potential need for additional imaging beyond regular 3D mammograms. Recent blood work shows low platelet counts, which are stable and not at a level that poses a risk of bleeding. Her white blood cell count has remained stable over the past three years. She inquires whether tamoxifen  or gabapentin  could be affecting her blood counts.     ALLERGIES:  is allergic to tetracyclines & related, asa [aspirin], and sulfa antibiotics.  MEDICATIONS:  Current Outpatient Medications  Medication Sig Dispense Refill   Cyanocobalamin (B-12 PO) Take by mouth.     gabapentin  (NEURONTIN ) 300 MG capsule TAKE 1 CAPSULE BY MOUTH EVERYDAY AT BEDTIME 90 capsule 4   latanoprost (XALATAN) 0.005 % ophthalmic solution Place 1 drop into both eyes at bedtime.     levothyroxine  (SYNTHROID ) 50 MCG tablet TAKE 1 TABLET BY MOUTH EVERY DAY 90 tablet 1   tamoxifen  (NOLVADEX ) 20 MG tablet Take 1 tablet (20 mg total) by mouth daily. 90 tablet 3   tretinoin (RETIN-A) 0.025 % cream APPLY A PEA SIZE AMOUNT TO AFFECTED AREAS NIGHTLY.     valACYclovir (VALTREX) 1000 MG tablet Take 1,000 mg by mouth 3 (three) times daily.     No current facility-administered medications for this visit.    PHYSICAL EXAMINATION: ECOG PERFORMANCE STATUS: 1 - Symptomatic but completely ambulatory  Vitals:   12/09/23 0846  BP: (!) 147/65  Pulse: (!) 51  Resp: 18  Temp: 97.7 F (36.5 C)  SpO2: 100%   Filed Weights   12/09/23 0846  Weight: 133 lb 4.8 oz (60.5  kg)    Physical Exam No palpable lumps or nodules in bilateral breasts or axilla  (exam performed in the presence of a chaperone)  LABORATORY DATA:  I have reviewed the data as listed    Latest Ref Rng & Units 07/03/2023   10:49 AM 12/26/2022    9:02 AM 12/04/2021    8:25 AM  CMP  Glucose 70 - 99 mg/dL 86  93  93   BUN 6 - 23 mg/dL 19  18  16    Creatinine 0.40 - 1.20 mg/dL 1.61  0.96  0.45   Sodium 135 - 145 mEq/L 140  140  143   Potassium 3.5 - 5.1 mEq/L 4.2  4.2  3.9   Chloride 96 - 112 mEq/L 105  105  108   CO2 19 - 32 mEq/L 28  28  28    Calcium 8.4 - 10.5 mg/dL 9.2  9.0  9.0   Total Protein 6.0 - 8.3 g/dL 7.1  6.9  6.8   Total Bilirubin 0.2 - 1.2 mg/dL 0.8  0.9  0.8   Alkaline Phos 39 - 117 U/L 38  44  48   AST 0 - 37 U/L 18  19  29    ALT 0 - 35 U/L 11  14  22      Lab Results  Component Value Date   WBC 3.7 (L) 07/03/2023   HGB 13.5 07/03/2023   HCT 39.2 07/03/2023   MCV 87.2 07/03/2023   PLT 127.0 (L) 07/03/2023   NEUTROABS 2.2 07/03/2023    ASSESSMENT & PLAN:  Malignant neoplasm of upper-outer quadrant of right breast in female, estrogen receptor positive (HCC) 11/01/2019: Right breast UOQ grade 1 ILC ER/PR positive HER2 negative Ki-67 2%, breast MRI 2 additional right breast lesions and one left breast lesion: Benign 12/23/2019: Right lumpectomy: Stage Ia ILC grade 2, margins negative, 0/2 lymph nodes negative 01/24/2020 to 02/21/2020: Adjuvant radiation 11/19/2019: Genetics: Negative September 2021: Tamoxifen  (status post hysterectomy and BSO)   Tamoxifen  adverse effects: 1.  Hot flashes: Currently is taking gabapentin    Patient is able to walk 3 to 5 miles on a daily basis rain or shine. She likes to drive and travel to [place]  [place]  [place] etc.    Breast cancer surveillance: 1.  Breast exam 12/09/2023: Benign 2. mammogram 11/05/2023: Benign breast density category C   Bone density 05/29/2022: T-score -0.1 (in 2021 bone density was 0.3: Normal)   Return to  clinic in 1 year for follow-up ------------------------------------- Assessment and Plan Assessment & Plan Breast cancer surveillance She has C density breast tissue, suitable for 3D mammograms. MRI not covered without criteria. Thermography not recommended. New blood test for circulating tumor DNA offers advanced surveillance with potential insurance coverage. - Continue 3D mammograms for routine surveillance. - Consider new blood test for circulating tumor DNA every six months.  Nausea Intermittent nausea possibly related to tamoxifen , no clear meal association.  Thrombocytopenia Platelet count 127,000/L, normal variation for her, no bleeding risk.  Leukopenia WBC count 3.0-4.0 x 10^9/L over three years, normal variation for her, not due to tamoxifen  or gabapentin .  Shingles Previous episode in November, no current symptoms. Discussed future vaccination post-recovery.  Norovirus infection Severe infection six months ago, no current symptoms.  No orders of the defined types were placed in this encounter.  The patient has a good understanding of the overall plan. she agrees with it. she will call with any problems that may develop before the next visit here. Total time spent: 30 mins including face to face time and time spent for planning, charting and co-ordination of care   Viinay K Linc Renne, MD 12/09/23

## 2023-12-09 NOTE — Assessment & Plan Note (Signed)
 11/01/2019: Right breast UOQ grade 1 ILC ER/PR positive HER2 negative Ki-67 2%, breast MRI 2 additional right breast lesions and one left breast lesion: Benign 12/23/2019: Right lumpectomy: Stage Ia ILC grade 2, margins negative, 0/2 lymph nodes negative 01/24/2020 to 02/21/2020: Adjuvant radiation 11/19/2019: Genetics: Negative September 2021: Tamoxifen  (status post hysterectomy and BSO)   Tamoxifen  adverse effects: 1.  Hot flashes: Currently is taking gabapentin    Patient is able to walk 3 to 5 miles on a daily basis rain or shine.   Breast cancer surveillance: 1.  Breast exam 12/09/2023: Benign 2. mammogram 11/05/2023: Benign breast density category C   Bone density 05/29/2022: T-score -0.1 (in 2021 bone density was 0.3: Normal)   Return to clinic in 1 year for follow-up

## 2023-12-10 ENCOUNTER — Ambulatory Visit: Payer: BC Managed Care – PPO | Admitting: Hematology and Oncology

## 2024-01-13 ENCOUNTER — Encounter: Payer: Self-pay | Admitting: Hematology and Oncology

## 2024-01-14 ENCOUNTER — Encounter: Payer: Self-pay | Admitting: *Deleted

## 2024-01-14 NOTE — Progress Notes (Signed)
 Per MD request RN successfully faxed [employer] orders.

## 2024-02-09 ENCOUNTER — Encounter: Payer: Self-pay | Admitting: Hematology and Oncology

## 2024-03-03 ENCOUNTER — Telehealth: Payer: Self-pay

## 2024-03-03 NOTE — Telephone Encounter (Signed)
 Pt called and reports she is experiencing intermittent left breast pain; pain comes on suddenly and states, it is a burning, sharp, twinge then goes away. She reports this happens several times a day and has been going on for the past week. She denies redness or palpating a lump. Pt was offered appt with Morna Kendall, DNP for symptom management appt d/t hx of right breast cancer. She accepted and will come in 03/04/24 at 1120.

## 2024-03-04 ENCOUNTER — Encounter: Payer: Self-pay | Admitting: Adult Health

## 2024-03-04 ENCOUNTER — Inpatient Hospital Stay: Attending: Hematology and Oncology | Admitting: Adult Health

## 2024-03-04 VITALS — BP 131/61 | HR 54 | Temp 98.6°F | Resp 17 | Wt 129.2 lb

## 2024-03-04 DIAGNOSIS — Z79810 Long term (current) use of selective estrogen receptor modulators (SERMs): Secondary | ICD-10-CM | POA: Insufficient documentation

## 2024-03-04 DIAGNOSIS — Z170 Estrogen receptor positive status [ER+]: Secondary | ICD-10-CM | POA: Diagnosis not present

## 2024-03-04 DIAGNOSIS — Z923 Personal history of irradiation: Secondary | ICD-10-CM | POA: Diagnosis not present

## 2024-03-04 DIAGNOSIS — N644 Mastodynia: Secondary | ICD-10-CM | POA: Diagnosis not present

## 2024-03-04 DIAGNOSIS — C50411 Malignant neoplasm of upper-outer quadrant of right female breast: Secondary | ICD-10-CM | POA: Diagnosis present

## 2024-03-04 NOTE — Progress Notes (Signed)
 [facility] Cancer Follow up:    Alexis Glade PARAS, MD [address] [place] [address]   DIAGNOSIS:  Cancer Staging  Malignant neoplasm of upper-outer quadrant of right breast in female, estrogen receptor positive (HCC) Staging form: Breast, AJCC 8th Edition - Clinical stage from 11/10/2019: Stage Unknown (cTX, cN0, cM0, G1, ER+, PR+, HER2-) - Unsigned Stage prefix: Initial diagnosis Histologic grading system: 3 grade system Laterality: Right Staged by: Pathologist and managing physician Stage used in treatment planning: Yes National guidelines used in treatment planning: Yes Type of national guideline used in treatment planning: NCCN - Pathologic stage from 12/23/2019: Stage IA (pT1a, pN0, cM0, G2, ER+, PR+, HER2-) - Signed by Alexis Morna Pickle, NP on 01/12/2020 Stage prefix: Initial diagnosis Histologic grading system: 3 grade system    SUMMARY OF ONCOLOGIC HISTORY: Oncology History  Malignant neoplasm of upper-outer quadrant of right breast in female, estrogen receptor positive (HCC)  11/04/2019 Initial Diagnosis   Malignant neoplasm of upper-outer quadrant of right breast in female, estrogen receptor positive (HCC)    Genetic Testing   Negative genetic testing. No pathogenic variants identified on the [facility] Common Hereditary Cancers + Melanoma panel. VUS in APC, POLE, and RNF43 identified. The report date is 11/19/2019.  The Common Hereditary Cancers Panel + Melanoma panel offered by [facility] includes sequencing and/or deletion duplication testing of the following 53 genes: APC*, ATM*, AXIN2, BAP1, BARD1, BMPR1A, BRCA1, BRCA2, BRIP1, CDH1, CDK4, CDKN2A (p14ARF), CDKN2A (p16INK4a), CHEK2, CTNNA1, DICER1*, EGFR, EPCAM*, GREM1*, HOXB13, KIT, MEN1*, MITF*, MLH1*, MSH2*, MSH3*, MSH6*, MUTYH, NBN, NF1*, NTHL1, PALB2, PDGFRA, PMS2*, POLD1*, POLE, POT1, PTEN*, RAD50, RAD51C, RAD51D, RB1*, RNF43, SDHA*, SDHB, SDHC*, SDHD, SMAD4, SMARCA4, STK11, TP53, TSC1*, TSC2,  VHL.   12/23/2019 Cancer Staging   Staging form: Breast, AJCC 8th Edition - Pathologic stage from 12/23/2019: Stage IA (pT1a, pN0, cM0, G2, ER+, PR+, HER2-) - Signed by Alexis Morna Pickle, NP on 01/12/2020   12/23/2019 Surgery   Right lumpectomy: Stage Ia ILC grade 2, margins negative, 0/2 lymph nodes negative    01/24/2020 - 02/21/2020 Radiation Therapy   Adjuvant radiation    03/2020 -  Anti-estrogen oral therapy   Tamoxifen  daily     CURRENT THERAPY:  INTERVAL HISTORY: Discussed the use of AI scribe software for clinical note transcription with the patient, who gave verbal consent to proceed.  History of Present Illness Alexis Young is a 58 year old female with right breast invasive lobular carcinoma, stage 1A, who presents with left breast pain.  She experiences a stabbing, burning, dagger-like pain in the left breast for one week, which worsens without a bra and with certain movements like making the bed or twisting. The pain is localized to the left breast without radiation. Wearing a bra reduces the pain's intensity.  Her medical history includes right breast invasive lobular carcinoma, stage 1A, diagnosed in April 2021, treated with lumpectomy and adjuvant radiation. She is currently on tamoxifen  therapy. A bilateral breast screening mammogram on November 03, 2023, showed no mammographic evidence of malignancy and breast density category C.     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  MEDICAL HISTORY: Past Medical History:  Diagnosis Date   Arthritis    Breast cancer (HCC) 11/02/2019   right breast   Cancer (HCC) 03/2013, 06/2013   malignant melanoma on right arm and back   Complication of anesthesia    Per patient after hysterectomy in 2016, had to stay over an extra night due to low oxygen levels   Dyspareunia    Elevated serum creatinine    Family history of colon cancer    Family history of lung cancer    Family history of melanoma    Family history of pancreatic cancer    Family history of stomach cancer    Family history of uterine cancer    Fibroid    GERD (gastroesophageal reflux disease)    takes protonix  as needed    Hypothyroidism    Personal history of radiation therapy    Premature ovarian failure 12/2008   FSH 137.1, started on HRT   Tennis elbow 2017   Right   Tinnitus 2020   both ears    SURGICAL HISTORY: Past Surgical History:  Procedure Laterality Date   ADENOIDECTOMY     per patient as a child   BILATERAL SALPINGECTOMY Bilateral 02/14/2015   Procedure: BILATERAL SALPINGECTOMY OOPHORECTOMY;  Surgeon: Alexis Alexis Alexis JAYSON Nikki, MD;  Location: [facility];  Service: Gynecology;  Laterality: Bilateral;   BREAST BIOPSY  1987   Dr. Nicholaus   BREAST BIOPSY Left 2021   BREAST BIOPSY Right 10/2021   BREAST EXCISIONAL BIOPSY Left    benign   BREAST LUMPECTOMY Right 2021   BREAST LUMPECTOMY WITH RADIOACTIVE SEED AND SENTINEL LYMPH NODE BIOPSY Right 12/23/2019   Procedure: RIGHT BREAST LUMPECTOMY X 3 WITH  RADIOACTIVE SEED AND SENTINEL LYMPH NODE MAPPING;  Surgeon: Alexis Ned, MD;  Location: [facility];  Service: General;  Laterality: Right;   BREAST SURGERY Right 04/2010   breast biopsy for multiple fibroadenoma - no atypia   CATARACT EXTRACTION, BILATERAL     CESAREAN SECTION     CRYOTHERAPY  11/1989   for persistent condyloma atypia   CYSTOSCOPY N/A 02/14/2015   Procedure: CYSTOSCOPY;  Surgeon: Alexis Alexis Alexis JAYSON Nikki, MD;  Location: [facility];  Service: Gynecology;  Laterality: N/A;   LASER ABLATION OF CONDYLOMAS  03/05/90 Dr. Christophe   perineal area- also had Bowens disease   MELANOMA EXCISION Right 03/2013   melanoma right arm   ROBOTIC ASSISTED TOTAL HYSTERECTOMY N/A 02/14/2015   Procedure: ROBOTIC ASSISTED TOTAL HYSTERECTOMY;  Surgeon: Alexis Alexis Alexis JAYSON Nikki, MD;  Location: [facility];  Service: Gynecology;  Laterality: N/A;   SKIN CANCER EXCISION  06/2013   midddle of back with path report of atypia   TUBAL LIGATION Bilateral    WISDOM TOOTH EXTRACTION      SOCIAL HISTORY: Social History   Socioeconomic History   Marital status: Married    Spouse  name: Not on file   Number of children: 1   Years of education: Not on file   Highest education level: Not on file  Occupational History    Employer: [employer]  Tobacco Use   Smoking status: Never   Smokeless tobacco: Never  Vaping Use   Vaping status: Never Used  Substance and Sexual Activity   Alcohol use: No   Drug use: No   Sexual activity: Yes    Partners: Male    Birth control/protection: Surgical    Comment: Hysterectomy  Other Topics Concern   Not on file  Social History Narrative   Not on file   Social Drivers of Health   Financial Resource Strain: Low Risk  (07/02/2023)   Overall Financial Resource Strain (CARDIA)    Difficulty of Paying Living Expenses: Not hard at all  Food Insecurity: No Food Insecurity (07/02/2023)   Hunger Vital Sign    Worried About Running Out of Food in the Last Year: Never  true    Ran Out of Food in the Last Year: Never true  Transportation Needs: No Transportation Needs (07/02/2023)   PRAPARE - Administrator, Civil Service (Medical): No    Lack of Transportation (Non-Medical): No  Physical Activity: Sufficiently Active (07/02/2023)   Exercise Vital Sign    Days of Exercise per Week: 6 days    Minutes of Exercise per Session: 80 min  Stress: Stress Concern Present (07/02/2023)   [employer] of Occupational Health - Occupational Stress Questionnaire    Feeling of Stress : To some extent  Social Connections: Unknown (07/02/2023)   Social Connection and Isolation Panel    Frequency of Communication with Friends and Family: More than three times a week    Frequency of Social Gatherings with Friends and Family: Patient declined    Attends Religious Services: Patient declined    Database administrator or Organizations: No    Attends Engineer, structural: Not on file    Marital Status: Married  Catering manager Violence: Not on file    FAMILY HISTORY: Family History  Problem Relation Age of Onset   Cancer Mother        lung and liver   Alcohol abuse Father        Cirrhosis of liver   Cancer Maternal Aunt        stomach cancer   Cancer Maternal Grandmother        uterine cancer   Heart failure Paternal Grandmother    Lung cancer Maternal Uncle    Lung cancer Maternal Uncle    Pancreatic cancer Paternal Uncle    Testicular cancer Cousin    Melanoma Cousin        of eye   Cancer Cousin        possible colon? dx 32s   Colon cancer Other    Breast cancer Cousin     Review of Systems  Constitutional:  Negative for appetite change, chills, fatigue, fever and unexpected weight change.  HENT:   Negative for hearing loss, lump/mass and trouble swallowing.   Eyes:  Negative for eye problems and icterus.  Respiratory:  Negative for chest tightness, cough and shortness of breath.   Cardiovascular:  Negative for chest pain, leg  swelling and palpitations.  Gastrointestinal:  Negative for abdominal distention, abdominal pain, constipation, diarrhea, nausea and vomiting.  Endocrine: Negative for hot flashes.  Genitourinary:  Negative for difficulty urinating.   Musculoskeletal:  Negative for  arthralgias.  Skin:  Negative for itching and rash.  Neurological:  Negative for dizziness, extremity weakness, headaches and numbness.  Hematological:  Negative for adenopathy. Does not bruise/bleed easily.  Psychiatric/Behavioral:  Negative for depression. The patient is not nervous/anxious.       PHYSICAL EXAMINATION    Vitals:   03/04/24 1146 03/04/24 1147  BP: (!) 145/63 131/61  Pulse: (!) 54   Resp: 17   Temp: 98.6 F (37 C)   SpO2: 100%     Physical Exam Constitutional:      General: She is not in acute distress.    Appearance: Normal appearance. She is not toxic-appearing.  HENT:     Head: Normocephalic and atraumatic.     Mouth/Throat:     Mouth: Mucous membranes are moist.     Pharynx: Oropharynx is clear. No oropharyngeal exudate or posterior oropharyngeal erythema.  Eyes:     General: No scleral icterus. Cardiovascular:     Rate and Rhythm: Normal rate and regular rhythm.     Pulses: Normal pulses.     Heart sounds: Normal heart sounds.  Pulmonary:     Effort: Pulmonary effort is normal.     Breath sounds: Normal breath sounds.  Chest:     Comments: Right breast s/p lumpectomy and radiation, no sign of local recurrence, left breast with focal tenderness at 12 o'clock, 2 cmfn, no nodule, or mass, otherwise benign Abdominal:     General: Abdomen is flat. Bowel sounds are normal. There is no distension.     Palpations: Abdomen is soft.     Tenderness: There is no abdominal tenderness.  Musculoskeletal:        General: No swelling.     Cervical back: Neck supple.  Lymphadenopathy:     Cervical: No cervical adenopathy.     Upper Body:     Right upper body: No supraclavicular or axillary  adenopathy.     Left upper body: No supraclavicular or axillary adenopathy.  Skin:    General: Skin is warm and dry.     Findings: No rash.  Neurological:     General: No focal deficit present.     Mental Status: She is alert.  Psychiatric:        Mood and Affect: Mood normal.        Behavior: Behavior normal.       ASSESSMENT and THERAPY PLAN:   Malignant neoplasm of upper-outer quadrant of right breast in female, estrogen receptor positive (HCC) 11/01/2019: Right breast UOQ grade 1 ILC ER/PR positive HER2 negative Ki-67 2%, breast MRI 2 additional right breast lesions and one left breast lesion: Benign 12/23/2019: Right lumpectomy: Stage Ia ILC grade 2, margins negative, 0/2 lymph nodes negative 01/24/2020 to 02/21/2020: Adjuvant radiation 11/19/2019: Genetics: Negative September 2021: Tamoxifen  (status post hysterectomy and BSO)   Tamoxifen  adverse effects: 1.  Hot flashes: Currently is taking gabapentin    Assessment and Plan Assessment & Plan Left breast pain New onset left breast pain, stabbing and burning, not associated with chest wall. Differential includes benign causes, but history of breast cancer necessitates imaging. - Order mammogram and ultrasound of the left breast, focus on twelve o'clock area. - Advise to await results and follow up if pain persists. - Consider physical therapy if pain continues despite normal imaging results.  Right breast invasive lobular carcinoma, status post lumpectomy and adjuvant therapy Stage 1A right breast invasive lobular carcinoma, post lumpectomy, radiation, and tamoxifen . Recent mammogram showed no malignancy. - Continue Tamoxifen   daily  RTC in 11/2024 as scheduled, and as needed based on imaging results.      All questions were answered. The patient knows to call the clinic with any problems, questions or concerns. We can certainly see the patient much sooner if necessary.  Total encounter time:20 minutes*in face-to-face visit  time, chart review, lab review, care coordination, order entry, and documentation of the encounter time.    Morna Kendall, NP 03/07/24 12:08 PM Medical Oncology and Hematology [facility] [address] [place], [place] [address] Tel. [phone]    Fax. [phone]  *Total Encounter Time as defined by the Centers for Medicare and Medicaid Services includes, in addition to the face-to-face time of a patient visit (documented in the note above) non-face-to-face time: obtaining and reviewing outside history, ordering and reviewing medications, tests or procedures, care coordination (communications with other health care professionals or caregivers) and documentation in the medical record.

## 2024-03-07 NOTE — Assessment & Plan Note (Signed)
 11/01/2019: Right breast UOQ grade 1 ILC ER/PR positive HER2 negative Ki-67 2%, breast MRI 2 additional right breast lesions and one left breast lesion: Benign 12/23/2019: Right lumpectomy: Stage Ia ILC grade 2, margins negative, 0/2 lymph nodes negative 01/24/2020 to 02/21/2020: Adjuvant radiation 11/19/2019: Genetics: Negative September 2021: Tamoxifen  (status post hysterectomy and BSO)   Tamoxifen  adverse effects: 1.  Hot flashes: Currently is taking gabapentin    Assessment and Plan Assessment & Plan Left breast pain New onset left breast pain, stabbing and burning, not associated with chest wall. Differential includes benign causes, but history of breast cancer necessitates imaging. - Order mammogram and ultrasound of the left breast, focus on twelve o'clock area. - Advise to await results and follow up if pain persists. - Consider physical therapy if pain continues despite normal imaging results.  Right breast invasive lobular carcinoma, status post lumpectomy and adjuvant therapy Stage 1A right breast invasive lobular carcinoma, post lumpectomy, radiation, and tamoxifen . Recent mammogram showed no malignancy. - Continue Tamoxifen  daily  RTC in 11/2024 as scheduled, and as needed based on imaging results.

## 2024-03-10 ENCOUNTER — Ambulatory Visit
Admission: RE | Admit: 2024-03-10 | Discharge: 2024-03-10 | Disposition: A | Discharge status: Home / Self Care | Source: Ambulatory Visit | Attending: Adult Health | Admitting: Adult Health

## 2024-03-10 DIAGNOSIS — N644 Mastodynia: Secondary | ICD-10-CM

## 2024-03-10 DIAGNOSIS — Z170 Estrogen receptor positive status [ER+]: Secondary | ICD-10-CM

## 2024-03-14 ENCOUNTER — Other Ambulatory Visit: Payer: Self-pay | Admitting: Internal Medicine

## 2024-04-13 ENCOUNTER — Other Ambulatory Visit: Payer: Self-pay | Admitting: Internal Medicine

## 2024-08-11 ENCOUNTER — Encounter: Payer: Self-pay | Admitting: Hematology and Oncology

## 2024-08-27 ENCOUNTER — Other Ambulatory Visit: Payer: Self-pay | Admitting: Hematology and Oncology

## 2024-09-14 ENCOUNTER — Encounter: Admitting: Internal Medicine

## 2024-12-09 ENCOUNTER — Ambulatory Visit: Admitting: Hematology and Oncology
